# Patient Record
Sex: Female | Born: 1970
Health system: Southern US, Community
[De-identification: ages and names within clinical notes are randomized; demographics above are authoritative.]

## PROBLEM LIST (undated history)

## (undated) DIAGNOSIS — Z9889 Other specified postprocedural states: Secondary | ICD-10-CM

## (undated) DIAGNOSIS — F329 Major depressive disorder, single episode, unspecified: Secondary | ICD-10-CM

## (undated) DIAGNOSIS — G4730 Sleep apnea, unspecified: Secondary | ICD-10-CM

## (undated) DIAGNOSIS — D3613 Benign neoplasm of peripheral nerves and autonomic nervous system of lower limb, including hip: Secondary | ICD-10-CM

## (undated) DIAGNOSIS — T8859XA Other complications of anesthesia, initial encounter: Secondary | ICD-10-CM

## (undated) DIAGNOSIS — J40 Bronchitis, not specified as acute or chronic: Secondary | ICD-10-CM

## (undated) DIAGNOSIS — R112 Nausea with vomiting, unspecified: Secondary | ICD-10-CM

## (undated) DIAGNOSIS — N200 Calculus of kidney: Secondary | ICD-10-CM

## (undated) DIAGNOSIS — Z85828 Personal history of other malignant neoplasm of skin: Secondary | ICD-10-CM

## (undated) DIAGNOSIS — I10 Essential (primary) hypertension: Secondary | ICD-10-CM

## (undated) DIAGNOSIS — F32A Depression, unspecified: Secondary | ICD-10-CM

## (undated) DIAGNOSIS — T7840XA Allergy, unspecified, initial encounter: Secondary | ICD-10-CM

## (undated) DIAGNOSIS — T4145XA Adverse effect of unspecified anesthetic, initial encounter: Secondary | ICD-10-CM

## (undated) DIAGNOSIS — T753XXA Motion sickness, initial encounter: Secondary | ICD-10-CM

## (undated) DIAGNOSIS — L409 Psoriasis, unspecified: Secondary | ICD-10-CM

## (undated) DIAGNOSIS — F419 Anxiety disorder, unspecified: Secondary | ICD-10-CM

## (undated) DIAGNOSIS — N261 Atrophy of kidney (terminal): Secondary | ICD-10-CM

## (undated) DIAGNOSIS — F909 Attention-deficit hyperactivity disorder, unspecified type: Secondary | ICD-10-CM

## (undated) DIAGNOSIS — K8020 Calculus of gallbladder without cholecystitis without obstruction: Secondary | ICD-10-CM

## (undated) HISTORY — PX: FOOT SURGERY: SHX648

## (undated) HISTORY — PX: OTHER SURGICAL HISTORY: SHX169

## (undated) HISTORY — PX: TUBAL LIGATION: SHX77

## (undated) HISTORY — DX: Benign neoplasm of peripheral nerves and autonomic nervous system of lower limb, including hip: D36.13

## (undated) HISTORY — PX: EXCISION MORTON'S NEUROMA: SHX5013

## (undated) HISTORY — PX: CHOLECYSTECTOMY: SHX55

## (undated) HISTORY — PX: COLONOSCOPY: SHX174

## (undated) HISTORY — PX: HERNIA REPAIR: SHX51

## (undated) HISTORY — DX: Allergy, unspecified, initial encounter: T78.40XA

## (undated) HISTORY — PX: SMALL INTESTINE SURGERY: SHX150

## (undated) HISTORY — DX: Essential (primary) hypertension: I10

## (undated) HISTORY — PX: ABDOMINAL HYSTERECTOMY: SHX81

## (undated) HISTORY — DX: Personal history of other malignant neoplasm of skin: Z85.828

---

## 2004-03-14 ENCOUNTER — Ambulatory Visit: Payer: Self-pay | Admitting: Internal Medicine

## 2004-03-29 ENCOUNTER — Emergency Department: Payer: Self-pay | Admitting: Emergency Medicine

## 2004-05-28 HISTORY — PX: TOTAL ABDOMINAL HYSTERECTOMY: SHX209

## 2004-06-22 ENCOUNTER — Inpatient Hospital Stay: Payer: Self-pay | Admitting: Obstetrics & Gynecology

## 2006-12-23 ENCOUNTER — Ambulatory Visit: Payer: Self-pay | Admitting: Obstetrics & Gynecology

## 2007-01-01 ENCOUNTER — Ambulatory Visit: Payer: Self-pay | Admitting: Unknown Physician Specialty

## 2007-01-16 ENCOUNTER — Ambulatory Visit: Payer: Self-pay | Admitting: Obstetrics & Gynecology

## 2007-01-23 ENCOUNTER — Ambulatory Visit: Payer: Self-pay | Admitting: Obstetrics & Gynecology

## 2007-02-21 ENCOUNTER — Ambulatory Visit: Payer: Self-pay | Admitting: Unknown Physician Specialty

## 2008-10-04 ENCOUNTER — Ambulatory Visit: Payer: Self-pay | Admitting: Unknown Physician Specialty

## 2008-11-11 ENCOUNTER — Ambulatory Visit: Payer: Self-pay | Admitting: Internal Medicine

## 2008-11-24 ENCOUNTER — Ambulatory Visit: Payer: Self-pay | Admitting: Family Medicine

## 2010-03-30 ENCOUNTER — Ambulatory Visit: Payer: Self-pay | Admitting: Family Medicine

## 2010-08-15 ENCOUNTER — Ambulatory Visit: Payer: Self-pay | Admitting: Unknown Physician Specialty

## 2010-11-08 ENCOUNTER — Ambulatory Visit: Payer: Self-pay | Admitting: Unknown Physician Specialty

## 2011-01-22 ENCOUNTER — Ambulatory Visit: Payer: Self-pay | Admitting: Bariatrics

## 2011-05-29 HISTORY — PX: OTHER SURGICAL HISTORY: SHX169

## 2011-06-25 DIAGNOSIS — N816 Rectocele: Secondary | ICD-10-CM | POA: Insufficient documentation

## 2011-07-27 DIAGNOSIS — R198 Other specified symptoms and signs involving the digestive system and abdomen: Secondary | ICD-10-CM | POA: Insufficient documentation

## 2011-08-14 ENCOUNTER — Ambulatory Visit: Payer: Self-pay | Admitting: Bariatrics

## 2011-08-20 ENCOUNTER — Ambulatory Visit: Payer: Self-pay | Admitting: Urology

## 2011-08-21 ENCOUNTER — Inpatient Hospital Stay: Payer: Self-pay | Admitting: Bariatrics

## 2011-08-21 LAB — CREATININE, SERUM
Creatinine: 0.94 mg/dL (ref 0.60–1.30)
EGFR (African American): >60
EGFR (Non-African Amer.): >60

## 2011-08-21 LAB — PLATELET COUNT: Platelet: 250 10*3/uL (ref 150–440)

## 2011-08-22 LAB — CBC WITH DIFFERENTIAL/PLATELET
Basophil #: 0 10*3/uL (ref 0.0–0.1)
Basophil %: 0.1 %
Eosinophil #: 0 10*3/uL (ref 0.0–0.7)
HGB: 12.2 g/dL (ref 12.0–16.0)
Lymphocyte #: 1.7 10*3/uL (ref 1.0–3.6)
Lymphocyte %: 19.9 %
MCH: 31 pg (ref 26.0–34.0)
MCHC: 33.7 g/dL (ref 32.0–36.0)
MCV: 92 fL (ref 80–100)
Monocyte #: 0.6 10*3/uL (ref 0.0–0.7)
Neutrophil #: 6.1 10*3/uL (ref 1.4–6.5)
RBC: 3.95 10*6/uL (ref 3.80–5.20)
RDW: 12.8 % (ref 11.5–14.5)

## 2011-08-22 LAB — BASIC METABOLIC PANEL
Anion Gap: 9 (ref 7–16)
Calcium, Total: 8.3 mg/dL — ABNORMAL LOW (ref 8.5–10.1)
Chloride: 105 mmol/L (ref 98–107)
Glucose: 92 mg/dL (ref 65–99)
Osmolality: 280 (ref 275–301)
Potassium: 4.2 mmol/L (ref 3.5–5.1)
Sodium: 141 mmol/L (ref 136–145)

## 2011-09-13 ENCOUNTER — Ambulatory Visit: Payer: Self-pay | Admitting: Bariatrics

## 2011-09-26 ENCOUNTER — Ambulatory Visit: Payer: Self-pay | Admitting: Bariatrics

## 2011-11-08 ENCOUNTER — Ambulatory Visit: Payer: Self-pay | Admitting: Bariatrics

## 2012-08-25 ENCOUNTER — Ambulatory Visit: Payer: Self-pay | Admitting: Emergency Medicine

## 2013-07-03 ENCOUNTER — Ambulatory Visit: Payer: Self-pay | Admitting: Bariatrics

## 2013-07-30 ENCOUNTER — Ambulatory Visit: Payer: Self-pay | Admitting: Bariatrics

## 2013-08-26 ENCOUNTER — Ambulatory Visit: Payer: Self-pay | Admitting: Physician Assistant

## 2014-07-05 DIAGNOSIS — Z85828 Personal history of other malignant neoplasm of skin: Secondary | ICD-10-CM

## 2014-09-19 NOTE — Op Note (Signed)
PATIENT NAME:  Meghan Weeks, Meghan Weeks MR#:  [id number] DATE OF BIRTH:  12/26/1970  DATE OF PROCEDURE:  08/21/2011  PREOPERATIVE DIAGNOSIS: Morbid obesity with a BMI of 40 associated with obstructive sleep apnea.   POSTOPERATIVE DIAGNOSIS: Morbid obesity with a BMI of 40 associated with obstructive sleep apnea with approximately 1 cm stomach volume within the lower mediastinum.   PROCEDURE PERFORMED: Laparoscopic sleeve gastrectomy with repair of hiatal hernia followed by intraoperative endoscopy.   SURGEON: Venia Carbon. Duke Salvia, MD  ASSISTANT: Darrin Luis, PA  PREOPERATIVE FINDINGS: Presence of a small hiatal hernia with some gastroesophageal reflux, as noted on contrast study.   DESCRIPTION OF PROCEDURE: The patient was brought to the operating room and placed in the supine position. General anesthesia was obtained with orotracheal intubation. The patient had a Foley catheter inserted sterilely with TED hose and Thromboguards applied. The patient had a foot board placed at the end of the operative bed. The lower chest and abdomen were then sterilely prepped and draped. A small incision was made in the left upper quadrant of the abdomen through which a 5 mm Optiview trocar was introduced under direct visualization. Pneumoperitoneum was obtained with carbon dioxide. Additional trocars were then introduced across the upper abdomen under direct visualization. A Nathanson liver retractor was introduced through a subxiphoid wound with elevation of the left lobe of the liver revealing a small hiatal hernia noted. The prior position of the nasogastric tube was withdrawn at this point. The patient had division of multiple gastric pedicles along the greater curvature of the stomach beginning at a point approximately 4 cm proximal to the pylorus. This was extended to a point in which the entire short gastric vascular pedicle was divided and the ligamentous attachments to the fundus were removed from the undersurface  of the left hemidiaphragm. Peritoneal attachments to the pancreas were also divided at this point. The patient then had division of the peritoneum across the region of the anterior hiatus. The patient again was noted to have a small hiatal hernia. The patient had division of the gastrohepatic ligament and then division of the peritoneum across the anterior hiatus, as described above. Blunt dissection was then used to mobilize the esophagus and associated anterior vagus nerve away from the overlying pericardium and from the upper lateral margins of the pleural surfaces. The patient then had division of the peritoneum along the right crus and the phrenoesophageal ligaments along the left crus. Circumferential mobilization of the distal esophagus was then performed freeing it from the underlying aorta and the residual aspects of the pleura. This ultimately resulted in delivery of approximately 1.5 cm of the esophagus lying comfortably in the abdominal cavity. A single 0 Ethibond suture was used to approximate the crural musculature posteriorly. At this point, a 8 French bougie was passed transorally and directed in the level of the antrum. The patient then had a series of GI staplers placed across the lateral aspect of the stomach creating a lateralized sleeve gastrectomy. The first two firings were green load staples placed relatively parallel to the transverse dimension of the lower stomach. Next, a vertical line of staples were placed parallel to the lesser curvature with the staple line being brought out just lateral to the angle of His creating a tubular affect of the stomach. There was one area approximately 5 cm inferior to the GE junction where there was slight deviation of the staple line of approximately 2 mm off the otherwise extremely straight vertical line. There was no overt  stenosis associated with this. The patient had excellent hemostasis with this. The stomach was occluded distally and an  intraoperative endoscopy performed. There was no evidence of an air leak appreciated. The patient again was noted to have a very consistent tubular affect with avoidance of any stenosis in the region of the incisura. The stomach was decompressed and the scope withdrawn at this point. The patient had the prior divided margins of the omentum reapproximated along the margins of the staple line with interrupted 0 Ethibond sutures. This was done to prevent torsion of the gastric sleeve and potentially sealing any leak that might occur, although none would be anticipated at this point. The lateral gastric segment was then removed via the right upper quadrant 15 mm trocar site. Following removal of the gastric specimen, the fascia and peritoneum of this wound was closed with 0 Vicryl suture as passed by a needle suture passing system. After confirming hemostasis,  the pneumoperitoneum was relieved and the trocars were removed. The wounds were injected with 0.25% Marcaine followed by 4-0 Monocryl to the dermis followed by Dermabond. The patient was allowed to recover at this point having tolerated the procedure well.  ____________________________ Venia Carbon. Duke Salvia, MD mat:slb D: 08/21/2011 11:54:24 ET    T: 08/21/2011 12:59:46 ET       JOB#: [id number] cc: Legrand Como A. Duke Salvia, MD, <Dictator> Ladora Daniel MD ELECTRONICALLY SIGNED 09/05/2011 9:32

## 2014-12-16 ENCOUNTER — Other Ambulatory Visit: Payer: Self-pay | Admitting: Podiatry

## 2014-12-16 DIAGNOSIS — M25579 Pain in unspecified ankle and joints of unspecified foot: Secondary | ICD-10-CM

## 2014-12-16 DIAGNOSIS — M25473 Effusion, unspecified ankle: Secondary | ICD-10-CM

## 2014-12-22 ENCOUNTER — Ambulatory Visit
Admission: RE | Admit: 2014-12-22 | Discharge: 2014-12-22 | Disposition: A | Discharge status: Home / Self Care | Payer: BLUE CROSS/BLUE SHIELD | Source: Ambulatory Visit | Attending: Podiatry | Admitting: Podiatry

## 2014-12-22 DIAGNOSIS — M25579 Pain in unspecified ankle and joints of unspecified foot: Secondary | ICD-10-CM

## 2014-12-22 DIAGNOSIS — M25571 Pain in right ankle and joints of right foot: Secondary | ICD-10-CM | POA: Diagnosis present

## 2014-12-22 DIAGNOSIS — M25471 Effusion, right ankle: Secondary | ICD-10-CM | POA: Insufficient documentation

## 2014-12-22 DIAGNOSIS — M25473 Effusion, unspecified ankle: Secondary | ICD-10-CM

## 2015-02-15 ENCOUNTER — Emergency Department
Admission: EM | Admit: 2015-02-15 | Discharge: 2015-02-15 | Disposition: A | Discharge status: Home / Self Care | Payer: BLUE CROSS/BLUE SHIELD | Attending: Emergency Medicine | Admitting: Emergency Medicine

## 2015-02-15 ENCOUNTER — Encounter: Payer: Self-pay | Admitting: *Deleted

## 2015-02-15 DIAGNOSIS — R079 Chest pain, unspecified: Secondary | ICD-10-CM | POA: Diagnosis present

## 2015-02-15 DIAGNOSIS — R0789 Other chest pain: Secondary | ICD-10-CM | POA: Insufficient documentation

## 2015-02-15 LAB — COMPREHENSIVE METABOLIC PANEL
ALBUMIN: 4.2 g/dL (ref 3.5–5.0)
ALT: 15 U/L (ref 14–54)
AST: 17 U/L (ref 15–41)
Alkaline Phosphatase: 58 U/L (ref 38–126)
Anion gap: 6 (ref 5–15)
BUN: 17 mg/dL (ref 6–20)
CHLORIDE: 103 mmol/L (ref 101–111)
CO2: 30 mmol/L (ref 22–32)
Calcium: 9.2 mg/dL (ref 8.9–10.3)
Creatinine, Ser: 0.92 mg/dL (ref 0.44–1.00)
GFR calc Af Amer: >60 mL/min (ref >60)
GFR calc non Af Amer: >60 mL/min (ref >60)
GLUCOSE: 95 mg/dL (ref 65–99)
POTASSIUM: 4.2 mmol/L (ref 3.5–5.1)
Sodium: 139 mmol/L (ref 135–145)
Total Bilirubin: 0.4 mg/dL (ref 0.3–1.2)
Total Protein: 7.4 g/dL (ref 6.5–8.1)

## 2015-02-15 LAB — TROPONIN I

## 2015-02-15 LAB — CBC
HCT: 39.7 % (ref 35.0–47.0)
Hemoglobin: 13.5 g/dL (ref 12.0–16.0)
MCH: 30.6 pg (ref 26.0–34.0)
MCHC: 33.9 g/dL (ref 32.0–36.0)
MCV: 90.1 fL (ref 80.0–100.0)
PLATELETS: 261 10*3/uL (ref 150–440)
RBC: 4.41 MIL/uL (ref 3.80–5.20)
RDW: 12.5 % (ref 11.5–14.5)
WBC: 6.4 10*3/uL (ref 3.6–11.0)

## 2015-02-15 MED ORDER — DIAZEPAM 5 MG PO TABS: 5.0000 mg | ORAL_TABLET | Freq: Three times a day (TID) | ORAL | Status: DC | PRN

## 2015-02-15 MED ORDER — IBUPROFEN 200 MG PO TABS: 600.0000 mg | ORAL_TABLET | Freq: Four times a day (QID) | ORAL | Status: DC | PRN

## 2015-02-15 NOTE — Discharge Instructions (Signed)
You were prescribed a medication that is potentially sedating. Do not drink alcohol, drive or participate in any other potentially dangerous activities while taking this medication as it may make you sleepy. Do not take this medication with any other sedating medications, either prescription or over-the-counter. If you were prescribed Percocet or Vicodin, do not take these with acetaminophen (Tylenol) as it is already contained within these medications.   Opioid pain medications (or "narcotics") can be habit forming.  Use it as little as possible to achieve adequate pain control.  Do not use or use it with extreme caution if you have a history of opiate abuse or dependence.  If you are on a pain contract with your primary care doctor or a pain specialist, be sure to let them know you were prescribed this medication today from the [facility] Emergency Department.  This medication is intended for your use only - do not give any to anyone else and keep it in a secure place where nobody else, especially children and pets, have access to it.  It will also cause or worsen constipation, so you may want to consider taking an over-the-counter stool softener while you are taking this medication.   Chest Wall Pain Chest wall pain is pain in or around the bones and muscles of your chest. It may take up to 6 weeks to get better. It may take longer if you must stay physically active in your work and activities.  CAUSES  Chest wall pain may happen on its own. However, it may be caused by:  A viral illness like the flu.  Injury.  Coughing.  Exercise.  Arthritis.  Fibromyalgia.  Shingles. HOME CARE INSTRUCTIONS   Avoid overtiring physical activity. Try not to strain or perform activities that cause pain. This includes any activities using your chest or your abdominal and side muscles, especially if heavy weights are used.  Put ice on the sore area.  Put ice in a plastic bag.  Place a towel  between your skin and the bag.  Leave the ice on for 15-20 minutes per hour while awake for the first 2 days.  Only take over-the-counter or prescription medicines for pain, discomfort, or fever as directed by your caregiver. SEEK IMMEDIATE MEDICAL CARE IF:   Your pain increases, or you are very uncomfortable.  You have a fever.  Your chest pain becomes worse.  You have new, unexplained symptoms.  You have nausea or vomiting.  You feel sweaty or lightheaded.  You have a cough with phlegm (sputum), or you cough up blood. MAKE SURE YOU:   Understand these instructions.  Will watch your condition.  Will get help right away if you are not doing well or get worse. Document Released: 05/14/2005 Document Revised: 08/06/2011 Document Reviewed: 01/08/2011 [facility] Patient Information 2015 [place], [place]. This information is not intended to replace advice given to you by your health care provider. Make sure you discuss any questions you have with your health care provider.

## 2015-02-15 NOTE — ED Notes (Signed)
Pt to triage via wheelchair.  Pt has chest pain for 3 days.  No n/v/d.  No diaphoresis.  Pt denies sob.  No cough.  Nonsmoker.  Pain radiates into left arm and left side of neck.

## 2015-02-15 NOTE — ED Provider Notes (Signed)
[facility] Emergency Department Provider Note  ____________________________________________  Time seen: 7:40 PM  I have reviewed the triage vital signs and the nursing notes.   HISTORY  Chief Complaint Chest Pain    HPI Meghan Weeks is a 44 y.o. female who complains of chest pain for the past 3 days. 3 days ago it was intermittent multiple episodes but over the last 2 days it's been constant. Worse with standing and movement. No nausea vomiting diarrhea diaphoresis or radiation. It is not exertional or pleuritic. No shortness of breath. No recent injuries or surgeries hospitalizations or serious travel.  Patient has had some episodes of lightheadedness recently relation when she stands up and walks around. She does note that she has not been drinking much fluids recently. Eating normally.   No past medical history on file. Negative  There are no active problems to display for this patient.    No past surgical history on file. Negative  Current Outpatient Rx  Name  Route  Sig  Dispense  Refill  . diazepam (VALIUM) 5 MG tablet   Oral   Take 1 tablet (5 mg total) by mouth every 8 (eight) hours as needed for muscle spasms.   6 tablet   0   . ibuprofen (MOTRIN IB) 200 MG tablet   Oral   Take 3 tablets (600 mg total) by mouth every 6 (six) hours as needed.   60 tablet   0      Allergies Review of patient's allergies indicates no known allergies.   No family history on file.  Social History Social History  Substance Use Topics  . Smoking status: Never Smoker   . Smokeless tobacco: Not on file  . Alcohol Use: No    Review of Systems  Constitutional:   No fever or chills. No weight changes Eyes:   No blurry vision or double vision.  ENT:   No sore throat. Cardiovascular:   Positive as above chest pain. Respiratory:   No dyspnea or cough. Gastrointestinal:   Negative for abdominal pain, vomiting and diarrhea.  No BRBPR or  melena. Genitourinary:   Negative for dysuria, urinary retention, bloody urine, or difficulty urinating. Musculoskeletal:   Negative for back pain. No joint swelling or pain. Skin:   Negative for rash. Neurological:   Negative for headaches, focal weakness or numbness. Psychiatric:  No anxiety or depression.   Endocrine:  No hot/cold intolerance, changes in energy, or sleep difficulty.  10-point ROS otherwise negative.  ____________________________________________   PHYSICAL EXAM:  VITAL SIGNS: ED Triage Vitals  Enc Vitals Group     BP 02/15/15 1716 122/80 mmHg     Pulse Rate 02/15/15 1716 64     Resp 02/15/15 1716 20     Temp 02/15/15 1716 98 F (36.7 C)     Temp Source 02/15/15 1716 Oral     SpO2 02/15/15 1716 99 %     Weight 02/15/15 1716 165 lb (74.844 kg)     Height 02/15/15 1716 5\' 5"  (1.651 m)     Head Cir --      Peak Flow --      Pain Score 02/15/15 1717 5     Pain Loc --      Pain Edu? --      Excl. in [place]? --      Constitutional:   Alert and oriented. Well appearing and in no distress. Eyes:   No scleral icterus. No conjunctival pallor. PERRL. [facility]  Head:   Normocephalic and atraumatic.   Nose:   No congestion/rhinnorhea. No septal hematoma   Mouth/Throat:   MMM, no pharyngeal erythema. No peritonsillar mass. No uvula shift.   Neck:   No stridor. No SubQ emphysema. No meningismus. Hematological/Lymphatic/Immunilogical:   No cervical lymphadenopathy. Cardiovascular:   RRR. Normal and symmetric distal pulses are present in all extremities. No murmurs, rubs, or gallops. Respiratory:   Normal respiratory effort without tachypnea nor retractions. Breath sounds are clear and equal bilaterally. No wheezes/rales/rhonchi. Gastrointestinal:   Soft and nontender. No distention. There is no CVA tenderness.  No rebound, rigidity, or guarding. Genitourinary:   deferred Musculoskeletal:   Left chest wall tender to palpation which is also reproduced with  rotator cuff stress. She also has tenderness in the trapezius and sternocleidomastoid on the left side which reproduces the pain. His muscles are also tense.  Extremities Nontender with normal range of motion in all extremities. No joint effusions.  No lower extremity tenderness.  No edema. Neurologic:   Normal speech and language.  CN 2-10 normal. Motor grossly intact. No pronator drift.  Normal gait. No gross focal neurologic deficits are appreciated.  Skin:    Skin is warm, dry and intact. No rash noted.  No petechiae, purpura, or bullae. Psychiatric:   Mood and affect are normal. Speech and behavior are normal. Patient exhibits appropriate insight and judgment.  ____________________________________________    LABS (pertinent positives/negatives) (all labs ordered are listed, but only abnormal results are displayed) Labs Reviewed  CBC  COMPREHENSIVE METABOLIC PANEL  TROPONIN I   ____________________________________________   EKG  Interpreted by me  Date: 02/15/2015  Rate: 59  Rhythm: normal sinus rhythm  QRS Axis: normal  Intervals: normal  ST/T Wave abnormalities: normal  Conduction Disutrbances: none  Narrative Interpretation: unremarkable      ____________________________________________    RADIOLOGY    ____________________________________________   PROCEDURES   ____________________________________________   INITIAL IMPRESSION / ASSESSMENT AND PLAN / ED COURSE  Pertinent labs & imaging results that were available during my care of the patient were reviewed by me and considered in my medical decision making (see chart for details).  Patient presents with atypical chest pain which actually on exam appears to be musculoskeletal in nature related to rotator cuff or the muscles of the left shoulder and neck. No history of trauma or any serious injury that might have caused this, but exam is consistent with muscle strain and spasm. Labs and EKG are  reassuring. Very low suspicion for ACS PE TAD pneumothorax carditis pneumonia or sepsis. We'll treat the patient with NSAIDs and when necessary Valium, follow up with primary care.     ____________________________________________   FINAL CLINICAL IMPRESSION(S) / ED DIAGNOSES  Final diagnoses:  Chest wall pain      Carrie Mew, MD 02/15/15 2015

## 2015-12-01 DIAGNOSIS — G4733 Obstructive sleep apnea (adult) (pediatric): Secondary | ICD-10-CM | POA: Diagnosis not present

## 2015-12-22 DIAGNOSIS — I890 Lymphedema, not elsewhere classified: Secondary | ICD-10-CM | POA: Diagnosis not present

## 2015-12-27 DIAGNOSIS — M25571 Pain in right ankle and joints of right foot: Secondary | ICD-10-CM | POA: Diagnosis not present

## 2015-12-27 DIAGNOSIS — D1723 Benign lipomatous neoplasm of skin and subcutaneous tissue of right leg: Secondary | ICD-10-CM | POA: Diagnosis not present

## 2015-12-27 DIAGNOSIS — R600 Localized edema: Secondary | ICD-10-CM | POA: Diagnosis not present

## 2016-01-16 ENCOUNTER — Ambulatory Visit: Payer: Self-pay | Admitting: Podiatry

## 2016-01-22 DIAGNOSIS — I890 Lymphedema, not elsewhere classified: Secondary | ICD-10-CM | POA: Diagnosis not present

## 2016-01-25 ENCOUNTER — Encounter: Payer: Self-pay | Admitting: Podiatry

## 2016-01-25 ENCOUNTER — Ambulatory Visit (INDEPENDENT_AMBULATORY_CARE_PROVIDER_SITE_OTHER): Payer: BLUE CROSS/BLUE SHIELD | Admitting: Podiatry

## 2016-01-25 DIAGNOSIS — I890 Lymphedema, not elsewhere classified: Secondary | ICD-10-CM

## 2016-01-25 DIAGNOSIS — M779 Enthesopathy, unspecified: Secondary | ICD-10-CM

## 2016-01-25 DIAGNOSIS — M722 Plantar fascial fibromatosis: Secondary | ICD-10-CM | POA: Diagnosis not present

## 2016-01-25 MED ORDER — MELOXICAM 15 MG PO TABS: 15.0000 mg | ORAL_TABLET | Freq: Every day | ORAL | 3 refills | Status: DC

## 2016-01-25 MED ORDER — METHYLPREDNISOLONE 4 MG PO TBPK: ORAL_TABLET | ORAL | 0 refills | Status: DC

## 2016-01-25 NOTE — Patient Instructions (Signed)
Plantar Fasciitis Plantar fasciitis is a painful foot condition that affects the heel. It occurs when the band of tissue that connects the toes to the heel bone (plantar fascia) becomes irritated. This can happen after exercising too much or doing other repetitive activities (overuse injury). The pain from plantar fasciitis can range from mild irritation to severe pain that makes it difficult for you to walk or move. The pain is usually worse in the morning or after you have been sitting or lying down for a while. CAUSES This condition may be caused by:  Standing for long periods of time.  Wearing shoes that do not fit.  Doing high-impact activities, including running, aerobics, and ballet.  Being overweight.  Having an abnormal way of walking (gait).  Having tight calf muscles.  Having high arches in your feet.  Starting a new athletic activity. SYMPTOMS The main symptom of this condition is heel pain. Other symptoms include:  Pain that gets worse after activity or exercise.  Pain that is worse in the morning or after resting.  Pain that goes away after you walk for a few minutes. DIAGNOSIS This condition may be diagnosed based on your signs and symptoms. Your health care provider will also do a physical exam to check for:  A tender area on the bottom of your foot.  A high arch in your foot.  Pain when you move your foot.  Difficulty moving your foot. You may also need to have imaging studies to confirm the diagnosis. These can include:  X-rays.  Ultrasound.  MRI. TREATMENT  Treatment for plantar fasciitis depends on the severity of the condition. Your treatment may include:  Rest, ice, and over-the-counter pain medicines to manage your pain.  Exercises to stretch your calves and your plantar fascia.  A splint that holds your foot in a stretched, upward position while you sleep (night splint).  Physical therapy to relieve symptoms and prevent problems in the  future.  Cortisone injections to relieve severe pain.  Extracorporeal shock wave therapy (ESWT) to stimulate damaged plantar fascia with electrical impulses. It is often used as a last resort before surgery.  Surgery, if other treatments have not worked after 12 months. HOME CARE INSTRUCTIONS  Take medicines only as directed by your health care provider.  Avoid activities that cause pain.  Roll the bottom of your foot over a bag of ice or a bottle of cold water. Do this for 20 minutes, 3-4 times a day.  Perform simple stretches as directed by your health care provider.  Try wearing athletic shoes with air-sole or gel-sole cushions or soft shoe inserts.  Wear a night splint while sleeping, if directed by your health care provider.  Keep all follow-up appointments with your health care provider. PREVENTION   Do not perform exercises or activities that cause heel pain.  Consider finding low-impact activities if you continue to have problems.  Lose weight if you need to. The best way to prevent plantar fasciitis is to avoid the activities that aggravate your plantar fascia. SEEK MEDICAL CARE IF:  Your symptoms do not go away after treatment with home care measures.  Your pain gets worse.  Your pain affects your ability to move or do your daily activities.   This information is not intended to replace advice given to you by your health care provider. Make sure you discuss any questions you have with your health care provider.   Document Released: 02/06/2001 Document Revised: 02/02/2015 Document Reviewed: 03/24/2014 Elsevier   Interactive Patient Education 2016 [employer]  

## 2016-01-25 NOTE — Progress Notes (Signed)
   Subjective:    Patient ID: Meghan Weeks, female    DOB: [birth year], 45 y.o.   MRN: [id number]  HPI: She presents today as a 45 year old white female with a chief complaint of pain to the lateral aspect of her foot and ankle. She states it is been aching and swelling for over a year. She's been to see Dr. Elvina Mattes in the past who x-rayed it had an MRI performed who gave her multiple injections to the right subtalar joint and put her in not only a boot but also a cast. She states that she has also been seen by vascular states that she has lymphedema bilaterally right greater than left. She states that therapies failed. She denies trauma. She goes on to say that her heel has been hurting the entire time.    Review of Systems  Constitutional: Positive for unexpected weight change.  Cardiovascular: Positive for leg swelling.  Endocrine: Positive for cold intolerance.  All other systems reviewed and are negative.      Objective:   Physical Exam: Vital signs are stable she is alert and oriented 3. Pulses are strongly palpable. Neurologic sensorium is intact. Deep tendon reflexes are intact. Muscle strength +5/5 dorsiflexion plantar flexors and inverters everters on his musculature is intact. She does have some mild lymphedema bilaterally. She has tenderness on palpation of the sinus tarsi of the right foot. Minimal pain on end range of motion of the subtalar joint. She has pain on palpation of the fourth and fifth metatarsal cuboid articulation site right. He also has pain on palpation of the medial calcaneal tubercle of the plantar fascia calcaneal insertion site. Radiographs taken in the office today demonstrate 3 views no major osseous abnormalities though soft tissue increase in density is visible at the plantar fascia calcaneal insertion site. I see no signs of osteoarthritic changes of the subtalar joint or of the ankle joint. I reviewed the old MRI taken last year that does not demonstrate any  type of soft tissue or osseous abnormality.        Assessment & Plan:  Assessment: Plantar fasciitis right foot. Lateral compensatory syndrome.  Plan: Discussed etiology pathology conservative versus surgical therapies. Injected the right heel today with Kenalog and local anesthetic placed her in a plantar fascia brace to be followed by a night splint. Recommended that she continue to use her compression devices for her lymphedema at all times. She is to keep her legs elevated as often as possible. Also placed her on a Medrol Dosepak to be followed by meloxicam. We discussed appropriate shoe gear stretching exercises and ice therapy. I will follow-up with her in 1 month.

## 2016-02-22 DIAGNOSIS — I890 Lymphedema, not elsewhere classified: Secondary | ICD-10-CM | POA: Diagnosis not present

## 2016-02-27 DIAGNOSIS — R05 Cough: Secondary | ICD-10-CM | POA: Diagnosis not present

## 2016-02-27 DIAGNOSIS — B9689 Other specified bacterial agents as the cause of diseases classified elsewhere: Secondary | ICD-10-CM | POA: Diagnosis not present

## 2016-02-27 DIAGNOSIS — J019 Acute sinusitis, unspecified: Secondary | ICD-10-CM | POA: Diagnosis not present

## 2016-02-29 ENCOUNTER — Ambulatory Visit (INDEPENDENT_AMBULATORY_CARE_PROVIDER_SITE_OTHER): Payer: BLUE CROSS/BLUE SHIELD | Admitting: Podiatry

## 2016-02-29 DIAGNOSIS — M779 Enthesopathy, unspecified: Secondary | ICD-10-CM | POA: Diagnosis not present

## 2016-02-29 DIAGNOSIS — M722 Plantar fascial fibromatosis: Secondary | ICD-10-CM | POA: Diagnosis not present

## 2016-02-29 NOTE — Progress Notes (Signed)
She presents today for follow-up of her plantar fasciitis and right lateral ankle pain. She states that she really never saw much improvement with the oral medication and the braces and strapping. She states that the injection did not seem to make a difference either. She still has pain to the anterolateral aspect of the foot as well as plantar medial aspect of the right heel.  Objective: Vital signs are stable she is alert and oriented 3 pulses are palpable. Neurologic sensorium is intact. Deep tendon reflexes are intact. She still has pain and tenderness on palpation of the medial continue tubercle right heel. She also has some tenderness on palpation to the anterolateral aspect of the right ankle. 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 An MRI had been performed July of last year without significant findings. I reviewed the MRI and the MRI report today. I see no significant findings other than inflammation of the plantar fascia at its insertion site. The ankle itself looks good.  Assessment: Chronic pain right ankle and foot history of trauma to the right ankle and foot. All conservative treatments have failed.  Plan: I'm requesting a follow-up MRI of the right foot if this is negative. I will consider rheumatology for enthesopathies.

## 2016-03-01 ENCOUNTER — Telehealth: Payer: Self-pay | Admitting: *Deleted

## 2016-03-01 NOTE — Addendum Note (Signed)
Addended by: Clovis Riley E on: 03/01/2016 09:17 AM   Modules accepted: Orders

## 2016-03-01 NOTE — Telephone Encounter (Signed)
MRI orders for right ankle and foot faxed to [facility].

## 2016-03-14 ENCOUNTER — Ambulatory Visit: Payer: BLUE CROSS/BLUE SHIELD

## 2016-03-23 DIAGNOSIS — G4733 Obstructive sleep apnea (adult) (pediatric): Secondary | ICD-10-CM | POA: Diagnosis not present

## 2016-03-26 ENCOUNTER — Encounter: Payer: Self-pay | Admitting: Podiatry

## 2016-03-26 ENCOUNTER — Ambulatory Visit (INDEPENDENT_AMBULATORY_CARE_PROVIDER_SITE_OTHER): Payer: BLUE CROSS/BLUE SHIELD | Admitting: Podiatry

## 2016-03-26 DIAGNOSIS — L409 Psoriasis, unspecified: Secondary | ICD-10-CM

## 2016-03-26 DIAGNOSIS — M722 Plantar fascial fibromatosis: Secondary | ICD-10-CM

## 2016-03-27 NOTE — Progress Notes (Signed)
She presents today for follow-up of her right ankle pain. She states it is really just sore as she refers to the anterolateral ankle and the plantar fascia of the right foot. We have requested an MRI the last time she was in that she failed to have this done because of its expense. She will like another option.  Objective: Pulses are the same strongly palpable pain overlying the sinus tarsi area as well as the plantar fascia at its insertion site.  Assessment lateral ankle pain chronic secondary to plantar fasciitis right.  Plan: I explained to her that her only obvious alternative other than opening the foot in looking at it would be to consider orthotics. I explained her that if orthotics failed to alleviate her symptoms then we would have to do an MRI or center to physical therapy. She understands this is amendable to it and will follow up with me once her orthotics come in.

## 2016-03-28 ENCOUNTER — Encounter: Payer: Self-pay | Admitting: Podiatry

## 2016-04-23 ENCOUNTER — Encounter: Payer: Self-pay | Admitting: Podiatry

## 2016-04-23 ENCOUNTER — Ambulatory Visit (INDEPENDENT_AMBULATORY_CARE_PROVIDER_SITE_OTHER): Payer: BLUE CROSS/BLUE SHIELD | Admitting: Podiatry

## 2016-04-23 DIAGNOSIS — M722 Plantar fascial fibromatosis: Secondary | ICD-10-CM

## 2016-04-23 NOTE — Patient Instructions (Signed)

## 2016-04-24 NOTE — Progress Notes (Signed)
She presents today to pick up her orthotics for her shoes. She states that her right heel is still painful but somewhat better than it was before.  Objective: Pulses are palpable no calf pain. She has pain on palpation medial calcaneal tubercle of the right heel.  Assessment: Intractable plantar fasciitis right.  Plan: Dispensed orthotics provided with both oral and written home going instructions for care and use of the orthotics. Reinjected the right heel today with Kenalog and local anesthetic will follow up with her in 1 month to 6 weeks.

## 2016-04-25 DIAGNOSIS — Z85828 Personal history of other malignant neoplasm of skin: Secondary | ICD-10-CM | POA: Diagnosis not present

## 2016-04-25 DIAGNOSIS — L409 Psoriasis, unspecified: Secondary | ICD-10-CM | POA: Diagnosis not present

## 2016-04-25 DIAGNOSIS — L709 Acne, unspecified: Secondary | ICD-10-CM | POA: Diagnosis not present

## 2016-04-25 DIAGNOSIS — L719 Rosacea, unspecified: Secondary | ICD-10-CM | POA: Diagnosis not present

## 2016-05-03 DIAGNOSIS — J208 Acute bronchitis due to other specified organisms: Secondary | ICD-10-CM | POA: Diagnosis not present

## 2016-05-06 ENCOUNTER — Ambulatory Visit (HOSPITAL_COMMUNITY)
Admission: RE | Admit: 2016-05-06 | Discharge: 2016-05-06 | Disposition: A | Discharge status: Home / Self Care | Payer: BLUE CROSS/BLUE SHIELD | Source: Ambulatory Visit | Attending: Podiatry | Admitting: Podiatry

## 2016-05-06 ENCOUNTER — Ambulatory Visit (HOSPITAL_COMMUNITY): Payer: BLUE CROSS/BLUE SHIELD

## 2016-05-06 DIAGNOSIS — M722 Plantar fascial fibromatosis: Secondary | ICD-10-CM | POA: Diagnosis not present

## 2016-05-06 DIAGNOSIS — M7989 Other specified soft tissue disorders: Secondary | ICD-10-CM | POA: Diagnosis not present

## 2016-05-06 DIAGNOSIS — M25571 Pain in right ankle and joints of right foot: Secondary | ICD-10-CM | POA: Diagnosis not present

## 2016-05-06 DIAGNOSIS — M779 Enthesopathy, unspecified: Secondary | ICD-10-CM | POA: Insufficient documentation

## 2016-05-06 DIAGNOSIS — M79671 Pain in right foot: Secondary | ICD-10-CM | POA: Diagnosis not present

## 2016-05-09 ENCOUNTER — Ambulatory Visit (INDEPENDENT_AMBULATORY_CARE_PROVIDER_SITE_OTHER): Payer: BLUE CROSS/BLUE SHIELD | Admitting: Podiatry

## 2016-05-09 DIAGNOSIS — M66871 Spontaneous rupture of other tendons, right ankle and foot: Secondary | ICD-10-CM | POA: Diagnosis not present

## 2016-05-09 DIAGNOSIS — M722 Plantar fascial fibromatosis: Secondary | ICD-10-CM | POA: Diagnosis not present

## 2016-05-09 NOTE — Patient Instructions (Signed)

## 2016-05-09 NOTE — Progress Notes (Signed)
She presents today for follow-up of her MRI and her ankle pain.  Objective: Vital signs are stable she is alert and oriented 3. She still has pain on palpation medial calcaneal tubercle of the right heel and some tenderness on palpation posterior tibial tendon right. MRI result does demonstrate a partial tear of the posterior tibial tendon and a chronic proximal plantar fasciitis. Also demonstrates a soft tissue increase in density overlying the sinus tarsi of the right foot.  Assessment: Posterior tibial tendon tear but her fasciitis right. Soft tissue mass.  Plan: We discussed surgical intervention consisting of endoscopic plantar fasciotomy right foot. Primary repair of posterior tibial tendon and excision of soft tissue mass anterolateral ankle right. We discussed surgical intervention in great detail today consisting of present on surgery possible postop complications which may include but are not limited to postop pain bleeding swelling infection recurrence need for further surgery overcorrection and under correction. She signed all 3 pages of the  patient's consent form and I will follow-up with her near future.

## 2016-05-11 ENCOUNTER — Telehealth: Payer: Self-pay | Admitting: *Deleted

## 2016-05-11 NOTE — Telephone Encounter (Signed)
"  I want to schedule my surgery.  I need to do it by the end of the year."  He can do it on December 28.  "Okay, I will take it."  Someone from the surgical center will call you with the arrival time a day or two before.  You can go ahead and register with the surgical center.

## 2016-05-13 ENCOUNTER — Encounter: Payer: Self-pay | Admitting: Podiatry

## 2016-05-18 ENCOUNTER — Ambulatory Visit (INDEPENDENT_AMBULATORY_CARE_PROVIDER_SITE_OTHER): Payer: BLUE CROSS/BLUE SHIELD

## 2016-05-18 ENCOUNTER — Ambulatory Visit (INDEPENDENT_AMBULATORY_CARE_PROVIDER_SITE_OTHER): Payer: BLUE CROSS/BLUE SHIELD | Admitting: Orthopaedic Surgery

## 2016-05-18 ENCOUNTER — Encounter (INDEPENDENT_AMBULATORY_CARE_PROVIDER_SITE_OTHER): Payer: Self-pay | Admitting: Orthopaedic Surgery

## 2016-05-18 DIAGNOSIS — G8929 Other chronic pain: Secondary | ICD-10-CM | POA: Diagnosis not present

## 2016-05-18 DIAGNOSIS — M25561 Pain in right knee: Secondary | ICD-10-CM | POA: Diagnosis not present

## 2016-05-19 NOTE — Progress Notes (Signed)
   Office Visit Note   Patient: Meghan Weeks           Date of Birth: Jun 07, 1970           MRN: [id number] Visit Date: 05/18/2016              Requested by: Ronnell Freshwater, NP [place],  [place] [id number] PCP: Ronnell Freshwater, NP   Assessment & Plan: Visit Diagnoses:  1. Chronic pain of right knee     Plan: Impression is medial meniscal tear versus chronic MCL tear versus pedis tendinitis. At this point patient has failed extensive conservative treatment and continues to have pain. I do think that she an MRI is warranted to evaluate for structural abnormality. Follow-up after the MRI.  Follow-Up Instructions: Return in about 2 weeks (around 06/01/2016) for review MRI right knee.   Orders:  Orders Placed This Encounter  Procedures  . XR KNEE 3 VIEW RIGHT  . MR Knee Right w/o contrast   No orders of the defined types were placed in this encounter.     Procedures: No procedures performed   Clinical Data: No additional findings.   Subjective: Chief Complaint  Patient presents with  . Right Knee - Pain    Patient is a 45 year old female who comes in with right medial knee pain that originally started in 2012 when she fell down some stairs and sustained a MCL sprain by MRI. She states that she continues to have pain in this region with what she calls soft tissue pain but not joint or bone pain. She rates the pain as 3-4 out of 10. The pain does not radiate. She is having foot surgery in a couple of days. She denies any mechanical symptoms or swelling. She endorses throbbing of the knee. Denies any constitutional symptoms.    Review of Systems Negative except for history of present illness  Objective: Vital Signs: There were no vitals taken for this visit.  Physical Exam Well-developed well-nourished no acute distress alert and 3 nonlabored breathing abdomen soft no lymphadenopathy Ortho Exam Exam of the right knee shows no joint effusion. She has  good range of motion of the knee. She is tender over the MCL medial joint line and has tendon. Hamstring tendons are not tender posteriorly. She does have pain with McMurray testing but not a true positive McMurray sign. Collaterals and cruciates are stable. Specialty Comments:  No specialty comments available.  Imaging: No results found.   PMFS History: Patient Active Problem List   Diagnosis Date Noted  . Chronic pain of right knee 05/18/2016  . History of nonmelanoma skin cancer 07/05/2014  . Personal history of other malignant neoplasm of skin 07/05/2014  . Other specified symptoms and signs involving the digestive system and abdomen 07/27/2011  . Symptoms involving digestive system 07/27/2011  . Rectocele 06/25/2011   No past medical history on file.  No family history on file.  No past surgical history on file. Social History   Occupational History  . Not on file.   Social History Main Topics  . Smoking status: Never Smoker  . Smokeless tobacco: Never Used  . Alcohol use No  . Drug use: Unknown  . Sexual activity: Not on file

## 2016-05-23 ENCOUNTER — Ambulatory Visit: Payer: BLUE CROSS/BLUE SHIELD | Admitting: Podiatry

## 2016-05-24 ENCOUNTER — Ambulatory Visit (INDEPENDENT_AMBULATORY_CARE_PROVIDER_SITE_OTHER): Payer: BLUE CROSS/BLUE SHIELD | Admitting: Vascular Surgery

## 2016-05-24 ENCOUNTER — Encounter: Payer: Self-pay | Admitting: Podiatry

## 2016-05-24 ENCOUNTER — Other Ambulatory Visit: Payer: Self-pay | Admitting: Podiatry

## 2016-05-24 DIAGNOSIS — M25571 Pain in right ankle and joints of right foot: Secondary | ICD-10-CM | POA: Diagnosis not present

## 2016-05-24 DIAGNOSIS — M6740 Ganglion, unspecified site: Secondary | ICD-10-CM

## 2016-05-24 DIAGNOSIS — M722 Plantar fascial fibromatosis: Secondary | ICD-10-CM

## 2016-05-24 DIAGNOSIS — S86811A Strain of other muscle(s) and tendon(s) at lower leg level, right leg, initial encounter: Secondary | ICD-10-CM | POA: Diagnosis not present

## 2016-05-24 DIAGNOSIS — G4733 Obstructive sleep apnea (adult) (pediatric): Secondary | ICD-10-CM | POA: Diagnosis not present

## 2016-05-24 DIAGNOSIS — M65871 Other synovitis and tenosynovitis, right ankle and foot: Secondary | ICD-10-CM

## 2016-05-24 DIAGNOSIS — M720 Palmar fascial fibromatosis [Dupuytren]: Secondary | ICD-10-CM | POA: Diagnosis not present

## 2016-05-24 DIAGNOSIS — D1723 Benign lipomatous neoplasm of skin and subcutaneous tissue of right leg: Secondary | ICD-10-CM | POA: Diagnosis not present

## 2016-05-24 MED ORDER — CEPHALEXIN 500 MG PO CAPS: 500.0000 mg | ORAL_CAPSULE | Freq: Three times a day (TID) | ORAL | 0 refills | Status: DC

## 2016-05-24 MED ORDER — HYDROMORPHONE HCL 4 MG PO TABS: 4.0000 mg | ORAL_TABLET | ORAL | 0 refills | Status: DC | PRN

## 2016-05-24 MED ORDER — ONDANSETRON HCL 4 MG PO TABS: 4.0000 mg | ORAL_TABLET | Freq: Three times a day (TID) | ORAL | 0 refills | Status: DC | PRN

## 2016-05-29 ENCOUNTER — Telehealth: Payer: Self-pay

## 2016-05-29 NOTE — Telephone Encounter (Signed)
Spoke with pt regarding post op status. She states that the 1st 2 days were difficult but since then she has been doing well, managing her pain with motrin. Advised her to continue with ice, elevation and rest. She denied any s/s of infection. Any acute symptom changes are to be reported

## 2016-05-30 ENCOUNTER — Ambulatory Visit (INDEPENDENT_AMBULATORY_CARE_PROVIDER_SITE_OTHER): Payer: BLUE CROSS/BLUE SHIELD

## 2016-05-30 ENCOUNTER — Ambulatory Visit (INDEPENDENT_AMBULATORY_CARE_PROVIDER_SITE_OTHER): Payer: Self-pay | Admitting: Podiatry

## 2016-05-30 VITALS — BP 119/79 | HR 61 | Temp 98.6°F | Resp 16

## 2016-05-30 DIAGNOSIS — M722 Plantar fascial fibromatosis: Secondary | ICD-10-CM

## 2016-05-30 DIAGNOSIS — Z9889 Other specified postprocedural states: Secondary | ICD-10-CM

## 2016-05-30 DIAGNOSIS — M66871 Spontaneous rupture of other tendons, right ankle and foot: Secondary | ICD-10-CM

## 2016-05-30 DIAGNOSIS — M6740 Ganglion, unspecified site: Secondary | ICD-10-CM

## 2016-05-30 NOTE — Progress Notes (Signed)
She presents today 1 week status post endoscopic plantar fasciotomy excision of a soft tissue tumor right ankle and repair of the posterior tibial tendon with a Kidner procedure right foot. She presents in a cast today utilizing a knee scooter. States that she's doing well denies chest sprain shortness of breath or calf pain.  Objective: Cast appears to be intact she has good sensation of the toes with good motion of the toes the cast is not tight around the calf radiographs demonstrate a anchor to the navicular bone and staples. No signs of gas or destruction of the soft tissue.  Assessment: Well-healing surgical foot status post one week.  Plan: Follow up with [place] in one week for cast change and staple removal.

## 2016-06-06 ENCOUNTER — Ambulatory Visit (INDEPENDENT_AMBULATORY_CARE_PROVIDER_SITE_OTHER): Payer: BLUE CROSS/BLUE SHIELD | Admitting: Podiatry

## 2016-06-06 DIAGNOSIS — Z9889 Other specified postprocedural states: Secondary | ICD-10-CM

## 2016-06-06 DIAGNOSIS — M66871 Spontaneous rupture of other tendons, right ankle and foot: Secondary | ICD-10-CM

## 2016-06-06 DIAGNOSIS — M722 Plantar fascial fibromatosis: Secondary | ICD-10-CM | POA: Diagnosis not present

## 2016-06-06 DIAGNOSIS — M6740 Ganglion, unspecified site: Secondary | ICD-10-CM

## 2016-06-06 NOTE — Progress Notes (Signed)
She presents today date of surgery 05/24/2016 status post EPF right foot excision of a lipoma anterolateral ankle right foot repair of the posterior tibial tendon right foot with cast application. She denies chest pain shortness of breath fever and chills. She does relate falling from her knee scooter catching herself with her surgical foot and landing on the medial aspect of the foot. States that she call the doctor on call as needed that she fell on this and he told her to follow up with me provided there was not a lot of pain.  Objective: Vital signs are stable to alert and oriented 3 cast removed today demonstrates moderate edema to the distal aspect of the forefoot no erythema cellulitis drainage or odor pain warmness in the calf. She has tenderness overlying the staple site and the medial incision EPF and anterolateral ankle incision sutures were removed portion of the staples were removed today.  Assessment: Well healing surgical foot.  Plan: Redressed today aggressive compressive dressing replace her cast will remove it in 2 weeks and remove the remainder of the staples.

## 2016-06-12 ENCOUNTER — Telehealth: Payer: Self-pay | Admitting: *Deleted

## 2016-06-12 NOTE — Telephone Encounter (Signed)
Pt states she was given the [facility] office phone to call about hitting her surgery foot on the floor. Left message informing pt to stay in the surgical shoe, rest, ice and elevate and make an appt to see Dr.Hyatt.

## 2016-06-20 ENCOUNTER — Ambulatory Visit (INDEPENDENT_AMBULATORY_CARE_PROVIDER_SITE_OTHER): Payer: BLUE CROSS/BLUE SHIELD | Admitting: Podiatry

## 2016-06-20 ENCOUNTER — Ambulatory Visit (INDEPENDENT_AMBULATORY_CARE_PROVIDER_SITE_OTHER): Payer: BLUE CROSS/BLUE SHIELD

## 2016-06-20 DIAGNOSIS — M6740 Ganglion, unspecified site: Secondary | ICD-10-CM | POA: Diagnosis not present

## 2016-06-20 DIAGNOSIS — Z9889 Other specified postprocedural states: Secondary | ICD-10-CM | POA: Diagnosis not present

## 2016-06-20 DIAGNOSIS — M66871 Spontaneous rupture of other tendons, right ankle and foot: Secondary | ICD-10-CM

## 2016-06-20 NOTE — Progress Notes (Signed)
She presents today for follow-up of an endoscopic plantar fasciotomy excision ganglion cyst from the ankle and a posterior tibial tendon repair right. She states that she fell over on her scooter hitting her foot. This makes twice that she's hit the foot from accidents with her scooter. She states this occurred about a week ago.  Objective: Vital signs are stable alert and oriented 3. Pulses are palpable. Neurologic sensorium is intact. Deep tendon reflexes are intact. Cast was removed demonstrates minimal edema no erythema cellulitis and throat are all sutures were removed staples were removed margins remain well coapted great range of motion.  Assessment: Healing surgical foot right.  Plan: Place her Cam Walker nonweightbearing status and will allow her to start washing this foot but she is not to stand on it at all. I did encourage some slight range of motion exercises.

## 2016-06-29 ENCOUNTER — Encounter (INDEPENDENT_AMBULATORY_CARE_PROVIDER_SITE_OTHER): Payer: Self-pay | Admitting: *Deleted

## 2016-07-02 ENCOUNTER — Ambulatory Visit (INDEPENDENT_AMBULATORY_CARE_PROVIDER_SITE_OTHER): Payer: Self-pay | Admitting: Podiatry

## 2016-07-02 DIAGNOSIS — M6740 Ganglion, unspecified site: Secondary | ICD-10-CM

## 2016-07-02 DIAGNOSIS — M722 Plantar fascial fibromatosis: Secondary | ICD-10-CM

## 2016-07-02 DIAGNOSIS — M66871 Spontaneous rupture of other tendons, right ankle and foot: Secondary | ICD-10-CM

## 2016-07-03 NOTE — Progress Notes (Signed)
She presents today for a postop visit regarding an endoscopic fasciotomy of the right foot as well as a posterior tibial tendon repair right foot. Date of surgery 05/24/2016. She states that she feels better but is still painful. She has been nonweightbearing with her Cam Walker and crutches.  Objective: She presents on a scooter today. Vital signs are stable she is alert and oriented 3. Pulses are palpable. Neurologic sensorium is intact. Deep tendon reflexes are intact she's a swelling to the right lower extremity pain. She has good passive range of motion to the right foot. She has tenderness on palpation of the posterior tibial tendon and inversion against resistance. Minimal tenderness on palpation of plantar aspect of the right heel.  Assessment: Well healing surgical foot status post 6 weeks EPL posterior tibial tendon repair and excision of a lipoma.  Plan: Continue to utilize compression to the right lower extremity and will allow her to start partial weightbearing this was demonstrated to her today with her crutches. We will progress her the next 2 weeks to weightbearing and which time we will try to go into a trial of brace next visit.

## 2016-07-04 ENCOUNTER — Encounter: Payer: BLUE CROSS/BLUE SHIELD | Admitting: Podiatry

## 2016-07-05 NOTE — Progress Notes (Signed)
DOS 12.28.2017 Endoscopic plantar fasciotomy right foot. Open repair post. tibial tendon tear right. Excision soft tissue mass right lateral ankle.

## 2016-07-06 ENCOUNTER — Encounter: Payer: Self-pay | Admitting: Podiatry

## 2016-07-12 ENCOUNTER — Ambulatory Visit (INDEPENDENT_AMBULATORY_CARE_PROVIDER_SITE_OTHER): Payer: BLUE CROSS/BLUE SHIELD | Admitting: Vascular Surgery

## 2016-07-18 ENCOUNTER — Ambulatory Visit (INDEPENDENT_AMBULATORY_CARE_PROVIDER_SITE_OTHER): Payer: Self-pay | Admitting: Podiatry

## 2016-07-18 ENCOUNTER — Encounter: Payer: Self-pay | Admitting: *Deleted

## 2016-07-18 ENCOUNTER — Ambulatory Visit
Admission: EM | Admit: 2016-07-18 | Discharge: 2016-07-18 | Disposition: A | Discharge status: Home / Self Care | Payer: BLUE CROSS/BLUE SHIELD | Attending: Family Medicine | Admitting: Family Medicine

## 2016-07-18 DIAGNOSIS — M722 Plantar fascial fibromatosis: Secondary | ICD-10-CM

## 2016-07-18 DIAGNOSIS — N3000 Acute cystitis without hematuria: Secondary | ICD-10-CM

## 2016-07-18 DIAGNOSIS — M66871 Spontaneous rupture of other tendons, right ankle and foot: Secondary | ICD-10-CM

## 2016-07-18 DIAGNOSIS — M6740 Ganglion, unspecified site: Secondary | ICD-10-CM

## 2016-07-18 HISTORY — DX: Anxiety disorder, unspecified: F41.9

## 2016-07-18 HISTORY — DX: Depression, unspecified: F32.A

## 2016-07-18 HISTORY — DX: Major depressive disorder, single episode, unspecified: F32.9

## 2016-07-18 HISTORY — DX: Psoriasis, unspecified: L40.9

## 2016-07-18 LAB — URINALYSIS, COMPLETE (UACMP) WITH MICROSCOPIC
Bilirubin Urine: NEGATIVE
Glucose, UA: NEGATIVE mg/dL
Nitrite: POSITIVE — AB
PROTEIN: 30 mg/dL — AB
SQUAMOUS EPITHELIAL / LPF: NONE SEEN
Specific Gravity, Urine: >1.03 — ABNORMAL HIGH (ref 1.005–1.030)
pH: 5.5 (ref 5.0–8.0)

## 2016-07-18 MED ORDER — NITROFURANTOIN MONOHYD MACRO 100 MG PO CAPS: 100.0000 mg | ORAL_CAPSULE | Freq: Two times a day (BID) | ORAL | 0 refills | Status: DC

## 2016-07-18 MED ORDER — PHENAZOPYRIDINE HCL 200 MG PO TABS: 200.0000 mg | ORAL_TABLET | Freq: Three times a day (TID) | ORAL | 0 refills | Status: DC | PRN

## 2016-07-18 MED ORDER — FLUCONAZOLE 150 MG PO TABS: 150.0000 mg | ORAL_TABLET | Freq: Once | ORAL | 0 refills | Status: AC

## 2016-07-18 NOTE — ED Provider Notes (Signed)
[facility]    CSN: [id number] Arrival date & time: 07/18/16  1635     History   Chief Complaint Chief Complaint  Patient presents with  . Urinary Urgency    HPI Meghan Weeks is a 46 y.o. female.   Patient is a 46 year old white female reports having symptoms of UTI. Started on Friday is progressively gotten worse. She reports she is tried to curette but drinking water and hydrate herself despite these efforts and hasn't helped much. Is progressively getting worse now she started having some right lower abdominal pain so because of the persistent symptoms and worsening of her symptoms she came in to be seen and evaluated. Past smoker history she is unable tolerate codeine she has a history of anxiety and depression and psoriasis. She's had tendon tear in her right leg and some crutches. She's had little malignant neoplasm of the skin nonmelanoma skin cancer as well as rectocele the past. Abdominal hysterectomy and foot surgery. No pertinent family history relevant to today's visit. She does not smoke. She denies any dyspareunia or vaginal discharge or vaginal irritation.   The history is provided by the patient. No language interpreter was used.  Urinary Frequency  This is a new problem. The current episode started more than 2 days ago. The problem occurs constantly. The problem has been gradually worsening. Associated symptoms include abdominal pain. Pertinent negatives include no chest pain, no headaches and no shortness of breath. Nothing aggravates the symptoms. Nothing relieves the symptoms. She has tried water for the symptoms. The treatment provided no relief.    Past Medical History:  Diagnosis Date  . Anxiety   . Depression   . Psoriasis     Patient Active Problem List   Diagnosis Date Noted  . Chronic pain of right knee 05/18/2016  . History of nonmelanoma skin cancer 07/05/2014  . Personal history of other malignant neoplasm of skin 07/05/2014  .  Other specified symptoms and signs involving the digestive system and abdomen 07/27/2011  . Symptoms involving digestive system 07/27/2011  . Rectocele 06/25/2011    Past Surgical History:  Procedure Laterality Date  . ABDOMINAL HYSTERECTOMY    . FOOT SURGERY Right     OB History    No data available       Home Medications    Prior to Admission medications   Medication Sig Start Date End Date Taking? Authorizing Provider  ibuprofen (MOTRIN IB) 200 MG tablet Take 3 tablets (600 mg total) by mouth every 6 (six) hours as needed. 02/15/15  Yes Carrie Mew, MD  mometasone (ELOCON) 0.1 % cream Apply topically. 04/25/16  Yes Historical Provider, MD  tretinoin (RETIN-A) 0.025 % cream  10/04/14  Yes Historical Provider, MD  albuterol (VENTOLIN HFA) 108 (90 Base) MCG/ACT inhaler  05/17/15   Historical Provider, MD  buPROPion (WELLBUTRIN XL) 150 MG 24 hr tablet Take 150 mg by mouth. 06/25/11   Historical Provider, MD  diazepam (VALIUM) 5 MG tablet Take 1 tablet (5 mg total) by mouth every 8 (eight) hours as needed for muscle spasms. Patient not taking: Reported on 05/18/2016 02/15/15   Carrie Mew, MD  fluconazole (DIFLUCAN) 150 MG tablet Take 1 tablet (150 mg total) by mouth once. 07/18/16 07/18/16  Frederich Cha, MD  hydrochlorothiazide (MICROZIDE) 12.5 MG capsule Take by mouth.    Historical Provider, MD  Levocarnitine HCl POWD Use. Reported on 07/30/2015    Historical Provider, MD  meloxicam (MOBIC) 15 MG tablet Take 1 tablet (  15 mg total) by mouth daily. Patient not taking: Reported on 05/18/2016 01/25/16   Max T Hyatt, DPM  nitrofurantoin, macrocrystal-monohydrate, (MACROBID) 100 MG capsule Take 1 capsule (100 mg total) by mouth 2 (two) times daily. 07/18/16   Frederich Cha, MD  phenazopyridine (PYRIDIUM) 200 MG tablet Take 1 tablet (200 mg total) by mouth 3 (three) times daily as needed for pain. 07/18/16   Frederich Cha, MD  sertraline (ZOLOFT) 100 MG tablet Take 100 mg by mouth. 06/25/11    Historical Provider, MD    Family History History reviewed. No pertinent family history.  Social History Social History  Substance Use Topics  . Smoking status: Never Smoker  . Smokeless tobacco: Never Used  . Alcohol use No     Allergies   Other   Review of Systems Review of Systems  Respiratory: Negative for shortness of breath.   Cardiovascular: Negative for chest pain.  Gastrointestinal: Positive for abdominal pain.  Genitourinary: Positive for frequency.  Musculoskeletal: Positive for joint swelling and myalgias.  Neurological: Negative for headaches.  All other systems reviewed and are negative.    Physical Exam Triage Vital Signs ED Triage Vitals  Enc Vitals Group     BP 07/18/16 1741 101/62     Pulse Rate 07/18/16 1741 61     Resp 07/18/16 1741 16     Temp 07/18/16 1741 98 F (36.7 C)     Temp Source 07/18/16 1741 Oral     SpO2 07/18/16 1741 100 %     Weight 07/18/16 1743 179 lb (81.2 kg)     Height 07/18/16 1743 5\' 6"  (1.676 m)     Head Circumference --      Peak Flow --      Pain Score 07/18/16 1749 3     Pain Loc --      Pain Edu? --      Excl. in [place]? --    No data found.   Updated Vital Signs BP 101/62 (BP Location: Left Arm)   Pulse 61   Temp 98 F (36.7 C) (Oral)   Resp 16   Ht 5\' 6"  (1.676 m)   Wt 179 lb (81.2 kg)   SpO2 100%   BMI 28.89 kg/m   Visual Acuity Right Eye Distance:   Left Eye Distance:   Bilateral Distance:    Right Eye Near:   Left Eye Near:    Bilateral Near:     Physical Exam  Constitutional: She is oriented to person, place, and time. She appears well-developed and well-nourished.  HENT:  Head: Normocephalic and atraumatic.  Right Ear: External ear normal.  Left Ear: External ear normal.  Eyes: Pupils are equal, round, and reactive to light.  Neck: Normal range of motion. Neck supple.  Pulmonary/Chest: Effort normal.  Abdominal: Soft. She exhibits no distension. There is tenderness.     Musculoskeletal: Normal range of motion.  Neurological: She is alert and oriented to person, place, and time.  Skin: Skin is warm.  Psychiatric: She has a normal mood and affect.  Vitals reviewed.    UC Treatments / Results  Labs (all labs ordered are listed, but only abnormal results are displayed) Labs Reviewed  URINALYSIS, COMPLETE (UACMP) WITH MICROSCOPIC - Abnormal; Notable for the following:       Result Value   APPearance CLOUDY (*)    Specific Gravity, Urine >1.030 (*)    Hgb urine dipstick TRACE (*)    Ketones, ur TRACE (*)  Protein, ur 30 (*)    Nitrite POSITIVE (*)    Leukocytes, UA LARGE (*)    Bacteria, UA MANY (*)    All other components within normal limits  URINE CULTURE    EKG  EKG Interpretation None       Radiology No results found.  Procedures Procedures (including critical care time)  Medications Ordered in UC Medications - No data to display  Results for orders placed or performed during the hospital encounter of 07/18/16  Urinalysis, Complete w Microscopic  Result Value Ref Range   Color, Urine YELLOW YELLOW   APPearance CLOUDY (A) CLEAR   Specific Gravity, Urine >1.030 (H) 1.005 - 1.030   pH 5.5 5.0 - 8.0   Glucose, UA NEGATIVE NEGATIVE mg/dL   Hgb urine dipstick TRACE (A) NEGATIVE   Bilirubin Urine NEGATIVE NEGATIVE   Ketones, ur TRACE (A) NEGATIVE mg/dL   Protein, ur 30 (A) NEGATIVE mg/dL   Nitrite POSITIVE (A) NEGATIVE   Leukocytes, UA LARGE (A) NEGATIVE   Squamous Epithelial / LPF NONE SEEN NONE SEEN   WBC, UA TOO NUMEROUS TO COUNT 0 - 5 WBC/hpf   RBC / HPF 0-5 0 - 5 RBC/hpf   Bacteria, UA MANY (A) NONE SEEN   Initial Impression / Assessment and Plan / UC Course  I have reviewed the triage vital signs and the nursing notes.  Pertinent labs & imaging results that were available during my care of the patient were reviewed by me and considered in my medical decision making (see chart for details).   will place patient  on Macrobid 100 mg 1 capsule twice a day pH is 5.5. Place on Diflucan as needed #21 now symptoms appear and repeat one week later if needed and will also place him Pyridium 200 mg 3 times a day for the next 5 days as well.   Final Clinical Impressions(s) / UC Diagnoses   Final diagnoses:  Acute cystitis without hematuria    New Prescriptions Discharge Medication List as of 07/18/2016  7:13 PM    START taking these medications   Details  fluconazole (DIFLUCAN) 150 MG tablet Take 1 tablet (150 mg total) by mouth once., Starting Wed 07/18/2016, Normal    nitrofurantoin, macrocrystal-monohydrate, (MACROBID) 100 MG capsule Take 1 capsule (100 mg total) by mouth 2 (two) times daily., Starting Wed 07/18/2016, Normal    phenazopyridine (PYRIDIUM) 200 MG tablet Take 1 tablet (200 mg total) by mouth 3 (three) times daily as needed for pain., Starting Wed 07/18/2016, Normal       Note: This dictation was prepared with Dragon dictation along with smaller phrase technology. Any transcriptional errors that result from this process are unintentional.   Frederich Cha, MD 07/18/16 [phone]

## 2016-07-18 NOTE — ED Triage Notes (Signed)
Patient started having symptoms of urinary urgency and eventually burning 4 days ago

## 2016-07-18 NOTE — Progress Notes (Signed)
She presents today for postop visit date of surgery is 05/24/2016 status post EPF right excision ganglion cyst anterolateral right ankle and open repair of her posterior tibial tendon right ankle. She states that everything feels better except for the tendon she presents today utilizing a single crutch in her Cam Walker. He states there is still hurts when she walks around the house without the crutch.  Objective: Vital signs are stable she is alert and oriented 3 minimal edema to the right foot. She has tenderness on inversion against resistance and on palpation of the incision site. All of the other surgical sites were gone on to heal uneventfully without any pain. Posterior tibial tendon was the worst.  Assessment: Well-healing surgical foot.  Plan: Encourage range of motion exercises ice therapy and shoe gear modifications. I also recommended that she utilize her Tri-Lock brace that she has at home and to try to get into her tennis shoes. I also recommended that she try to get away from her crutches. If she is not better next visit and we will consider physical therapy.

## 2016-07-21 LAB — URINE CULTURE

## 2016-08-08 ENCOUNTER — Ambulatory Visit (INDEPENDENT_AMBULATORY_CARE_PROVIDER_SITE_OTHER): Payer: BLUE CROSS/BLUE SHIELD | Admitting: Podiatry

## 2016-08-08 ENCOUNTER — Encounter: Payer: Self-pay | Admitting: Podiatry

## 2016-08-08 DIAGNOSIS — M66871 Spontaneous rupture of other tendons, right ankle and foot: Secondary | ICD-10-CM

## 2016-08-08 DIAGNOSIS — M722 Plantar fascial fibromatosis: Secondary | ICD-10-CM

## 2016-08-08 DIAGNOSIS — M6740 Ganglion, unspecified site: Secondary | ICD-10-CM

## 2016-08-08 NOTE — Progress Notes (Signed)
Meghan Weeks presents today for follow-up of her endoscopic plantar fasciotomy excision lipoma anterolateral ankle and repair of her posterior tibial tendon with the cast right. She states that this foot is still giving me trouble that she states that every day it's a little better. She states there is still just really sore. She states there is no better now than it was prior to surgery.  Objective: Vital signs are stable she is alert and oriented 3. Much decrease in edema overlying the posterior tibial tendon insertion and no longer unable palpate fluid within the tendon sheath.  Pulses remain palpable minimal edema about the right foot.  Assessment: Continued postoperative pain right foot. Nearly 3 months postsurgical.  Plan: At this point we are referring her to physical therapy.

## 2016-08-13 DIAGNOSIS — M25571 Pain in right ankle and joints of right foot: Secondary | ICD-10-CM | POA: Diagnosis not present

## 2016-08-16 DIAGNOSIS — M25571 Pain in right ankle and joints of right foot: Secondary | ICD-10-CM | POA: Diagnosis not present

## 2016-08-21 DIAGNOSIS — M25571 Pain in right ankle and joints of right foot: Secondary | ICD-10-CM | POA: Diagnosis not present

## 2016-08-28 DIAGNOSIS — M25571 Pain in right ankle and joints of right foot: Secondary | ICD-10-CM | POA: Diagnosis not present

## 2016-08-30 DIAGNOSIS — M25571 Pain in right ankle and joints of right foot: Secondary | ICD-10-CM | POA: Diagnosis not present

## 2016-09-04 DIAGNOSIS — M25571 Pain in right ankle and joints of right foot: Secondary | ICD-10-CM | POA: Diagnosis not present

## 2016-09-10 ENCOUNTER — Ambulatory Visit (INDEPENDENT_AMBULATORY_CARE_PROVIDER_SITE_OTHER): Payer: BLUE CROSS/BLUE SHIELD | Admitting: Podiatry

## 2016-09-10 ENCOUNTER — Encounter: Payer: Self-pay | Admitting: Podiatry

## 2016-09-10 DIAGNOSIS — M792 Neuralgia and neuritis, unspecified: Secondary | ICD-10-CM | POA: Diagnosis not present

## 2016-09-10 DIAGNOSIS — M722 Plantar fascial fibromatosis: Secondary | ICD-10-CM

## 2016-09-10 DIAGNOSIS — M66871 Spontaneous rupture of other tendons, right ankle and foot: Secondary | ICD-10-CM

## 2016-09-10 DIAGNOSIS — M6740 Ganglion, unspecified site: Secondary | ICD-10-CM

## 2016-09-10 MED ORDER — PREDNISONE 10 MG (21) PO TBPK: ORAL_TABLET | ORAL | 0 refills | Status: DC

## 2016-09-10 NOTE — Progress Notes (Signed)
She presents today for follow-up of her surgical foot right. Date of surgery 05/24/2016 status post endoscopic plantar fasciotomy excision lipoma anterolateral ankle and open repair of the posterior tibial tendon tear right foot. She states this still swollen and still tender. She continues physical therapy for at least another week or so.  She states that this right leg is always been swollen more than they other leg but has been swollen and has had a problem with swelling since she was a little girl. She cannot wear compression hose but does have diuretics which she does not take regularly. She is also is experiencing pain beneath the first metatarsophalangeal joint of the right foot.  She has compression pumps at home to help with the lymphedema in her legs.  Objective: Vital signs are stable she is alert and oriented 3 pulses are palpable. Surgical sites and gone on to heal uneventfully and there is no pain associated with them. She has swelling but decreased from last visit. Palpable neuroma or fascicle with radiating pain just distal to the tibial sesamoid.  Assessment: Lymphedema bilateral lower extremity status post surgical repair right foot. J[place]lin's neuroma.  Plan: Placed her on a Sterapred dose pack. Injected the painful first metatarsophalangeal joint area plantar medial. Discussed appropriate shoe gear compression hose exercising etc.

## 2016-10-08 ENCOUNTER — Encounter: Payer: Self-pay | Admitting: Podiatry

## 2016-10-08 ENCOUNTER — Ambulatory Visit (INDEPENDENT_AMBULATORY_CARE_PROVIDER_SITE_OTHER): Payer: BLUE CROSS/BLUE SHIELD | Admitting: Podiatry

## 2016-10-08 DIAGNOSIS — D3610 Benign neoplasm of peripheral nerves and autonomic nervous system, unspecified: Secondary | ICD-10-CM

## 2016-11-05 ENCOUNTER — Ambulatory Visit (INDEPENDENT_AMBULATORY_CARE_PROVIDER_SITE_OTHER): Payer: BLUE CROSS/BLUE SHIELD | Admitting: Podiatry

## 2016-11-05 ENCOUNTER — Encounter: Payer: Self-pay | Admitting: Podiatry

## 2016-11-05 DIAGNOSIS — D3610 Benign neoplasm of peripheral nerves and autonomic nervous system, unspecified: Secondary | ICD-10-CM | POA: Diagnosis not present

## 2016-11-05 NOTE — Progress Notes (Signed)
She presents today for follow-up of her J[place]'s neuroma along the tibial sesamoid of the right foot. She states it really hasn't gotten any better with the last injection.  Objective: Reproducible pain today just distal to the tibial sesamoid.  Assessment: Neuritis J[place]lin's neuroma.  Plan: I injected the area today with a second dose of dehydrated alcohol. Follow-up with her in 3 weeks.

## 2016-11-26 ENCOUNTER — Encounter: Payer: Self-pay | Admitting: Podiatry

## 2016-11-26 ENCOUNTER — Ambulatory Visit (INDEPENDENT_AMBULATORY_CARE_PROVIDER_SITE_OTHER): Payer: BLUE CROSS/BLUE SHIELD | Admitting: Podiatry

## 2016-11-26 DIAGNOSIS — D3610 Benign neoplasm of peripheral nerves and autonomic nervous system, unspecified: Secondary | ICD-10-CM

## 2016-11-26 NOTE — Progress Notes (Signed)
She presents today for follow-up of her J[place]lin's neuroma. She states that it was actually doing better for a couple of weeks and now starting to become sore again.  Objective: Vital signs are stable alert and oriented 3. Pulses are palpable. Neurologic sensorium is intact. She has pain on palpation plantar medial nerve branch from the first metatarsophalangeal joint right foot just distal to the tibial sesamoid.  Assessment: [place]'s neuroma neuritis.  Plan: Injected her third dose of dehydrated alcohol follow-up with her in 3 weeks

## 2016-12-06 DIAGNOSIS — M654 Radial styloid tenosynovitis [de Quervain]: Secondary | ICD-10-CM | POA: Diagnosis not present

## 2016-12-06 DIAGNOSIS — J019 Acute sinusitis, unspecified: Secondary | ICD-10-CM | POA: Diagnosis not present

## 2016-12-06 DIAGNOSIS — B9689 Other specified bacterial agents as the cause of diseases classified elsewhere: Secondary | ICD-10-CM | POA: Diagnosis not present

## 2016-12-19 ENCOUNTER — Encounter: Payer: Self-pay | Admitting: Podiatry

## 2016-12-19 ENCOUNTER — Ambulatory Visit (INDEPENDENT_AMBULATORY_CARE_PROVIDER_SITE_OTHER): Payer: BLUE CROSS/BLUE SHIELD | Admitting: Podiatry

## 2016-12-19 DIAGNOSIS — D3610 Benign neoplasm of peripheral nerves and autonomic nervous system, unspecified: Secondary | ICD-10-CM | POA: Diagnosis not present

## 2016-12-19 NOTE — Progress Notes (Signed)
She presents today for follow-up of her job is neuroma first metatarsophalangeal joint right foot. She states his lower back and forth but is feeling better than it was.  Objective: Vital signs are stable she is alert and oriented 3. Pulses are palpable area tenderness between the first and second metatarsal and 21st and second digits.  Job is neuroma.  Plan: Follow-up with her in the near future.

## 2016-12-19 NOTE — Progress Notes (Signed)
She presents today for follow-up of neuroma to the plantar medial first metatarsal phalangeal joint. She states this seems to be doing better and it has improved slightly.  Objective: Vital signs are stable she is alert and oriented 3 and currently unable to palpate the painful mass that was there previously. The pain is still present but not as nearly as acute as previously noted.  Assessment: Resolving neuritis Joplin's neuroma right foot.  Plan: Injected the fourth dose of dehydrated alcohol today and will follow up with her in 1 month

## 2017-01-14 ENCOUNTER — Ambulatory Visit (INDEPENDENT_AMBULATORY_CARE_PROVIDER_SITE_OTHER): Payer: BLUE CROSS/BLUE SHIELD | Admitting: Podiatry

## 2017-01-14 ENCOUNTER — Encounter: Payer: Self-pay | Admitting: Podiatry

## 2017-01-14 DIAGNOSIS — D3610 Benign neoplasm of peripheral nerves and autonomic nervous system, unspecified: Secondary | ICD-10-CM | POA: Diagnosis not present

## 2017-01-14 NOTE — Progress Notes (Signed)
She presents today for follow-up of her Joplin's neuroma she states it is doing much better the nodule has gone down and the pain is not as severe.  Objective: Moderate edema to bilateral lower extremity today lymphedema is a problem for her. She has pain on palpation to the tibial sesamoid area.  Assessment: Joplin's neuroma.  Plan: Injected today with dehydrated alcohol follow-up with her in 3 weeks. Instructed her to follow up with primary care for her fluid pill.

## 2017-01-22 DIAGNOSIS — L601 Onycholysis: Secondary | ICD-10-CM | POA: Diagnosis not present

## 2017-01-22 DIAGNOSIS — L718 Other rosacea: Secondary | ICD-10-CM | POA: Diagnosis not present

## 2017-01-22 DIAGNOSIS — L568 Other specified acute skin changes due to ultraviolet radiation: Secondary | ICD-10-CM | POA: Diagnosis not present

## 2017-01-22 DIAGNOSIS — C44311 Basal cell carcinoma of skin of nose: Secondary | ICD-10-CM | POA: Diagnosis not present

## 2017-02-04 ENCOUNTER — Ambulatory Visit: Payer: BLUE CROSS/BLUE SHIELD | Admitting: Podiatry

## 2017-02-11 ENCOUNTER — Ambulatory Visit (INDEPENDENT_AMBULATORY_CARE_PROVIDER_SITE_OTHER): Payer: BLUE CROSS/BLUE SHIELD | Admitting: Podiatry

## 2017-02-11 ENCOUNTER — Encounter: Payer: Self-pay | Admitting: Podiatry

## 2017-02-11 DIAGNOSIS — D3610 Benign neoplasm of peripheral nerves and autonomic nervous system, unspecified: Secondary | ICD-10-CM

## 2017-02-11 NOTE — Progress Notes (Signed)
She presents today for follow-up of her Joplin's neuroma to the plantar medial aspect of the first metatarsophalangeal joint. She states that not only has not improved since last visit but he seems to have gotten worse. She states that the outside of her foot and she points to the fifth metatarsal area is also painful as well as the first metatarsal phalangeal joint itself.  Objective: She is range of motion of the first metatarsophalangeal joint and on palpation of the tibial sesamoid. Radiographs was again reviewed did only demonstrate normal osseous architecture as he no signs of fracture however does not mean that there cannot be bony edema or sesamoiditis at this point. Also to be a tear in the suspensory ligament or a neuroma that is present.  Assessment: Rule out Joplin's neuroma first metatarsophalangeal joint and sesamoiditis. Lateral capsulitis around the fifth metatarsal cuboid articulation.  Plan: Requesting an MRI to evaluate forefoot since all conservative therapies have failed at this point. More than likely this is going to be a surgical intervention.

## 2017-02-14 ENCOUNTER — Telehealth: Payer: Self-pay

## 2017-02-14 NOTE — Addendum Note (Signed)
Addended by: Graceann Congress D on: 02/14/2017 11:48 AM   Modules accepted: Orders

## 2017-02-14 NOTE — Telephone Encounter (Signed)
Per Lowell Bouton. With [employer], no prior auth required for MRI Patient has been notified and she will schedule her own appt at [facility] hospital

## 2017-02-14 NOTE — Telephone Encounter (Signed)
-----   Message from Rip Harbour, [facility] sent at 02/11/2017  4:47 PM EDT ----- Regarding: MRI  MRI right foot - evaluate pain at lateral foot, 1st MPJ/sesamoiditis right

## 2017-02-19 ENCOUNTER — Ambulatory Visit (HOSPITAL_COMMUNITY)
Admission: RE | Admit: 2017-02-19 | Discharge: 2017-02-19 | Disposition: A | Discharge status: Home / Self Care | Payer: BLUE CROSS/BLUE SHIELD | Source: Ambulatory Visit | Attending: Podiatry | Admitting: Podiatry

## 2017-02-19 DIAGNOSIS — M7989 Other specified soft tissue disorders: Secondary | ICD-10-CM | POA: Insufficient documentation

## 2017-02-19 DIAGNOSIS — M79671 Pain in right foot: Secondary | ICD-10-CM | POA: Diagnosis not present

## 2017-02-19 DIAGNOSIS — D3610 Benign neoplasm of peripheral nerves and autonomic nervous system, unspecified: Secondary | ICD-10-CM | POA: Diagnosis not present

## 2017-02-27 ENCOUNTER — Ambulatory Visit
Admission: EM | Admit: 2017-02-27 | Discharge: 2017-02-27 | Disposition: A | Discharge status: Home / Self Care | Payer: BLUE CROSS/BLUE SHIELD | Attending: Family Medicine | Admitting: Family Medicine

## 2017-02-27 ENCOUNTER — Encounter: Payer: Self-pay | Admitting: Emergency Medicine

## 2017-02-27 DIAGNOSIS — R82998 Other abnormal findings in urine: Secondary | ICD-10-CM

## 2017-02-27 DIAGNOSIS — R202 Paresthesia of skin: Secondary | ICD-10-CM | POA: Diagnosis not present

## 2017-02-27 DIAGNOSIS — R109 Unspecified abdominal pain: Secondary | ICD-10-CM | POA: Diagnosis not present

## 2017-02-27 LAB — URINALYSIS, COMPLETE (UACMP) WITH MICROSCOPIC
BILIRUBIN URINE: NEGATIVE
Glucose, UA: NEGATIVE mg/dL
Hgb urine dipstick: NEGATIVE
Ketones, ur: NEGATIVE mg/dL
Leukocytes, UA: NEGATIVE
NITRITE: NEGATIVE
PH: 6.5 (ref 5.0–8.0)
Protein, ur: NEGATIVE mg/dL
SPECIFIC GRAVITY, URINE: 1.025 (ref 1.005–1.030)

## 2017-02-27 MED ORDER — TAMSULOSIN HCL 0.4 MG PO CAPS: 0.4000 mg | ORAL_CAPSULE | Freq: Every day | ORAL | 0 refills | Status: DC

## 2017-02-27 NOTE — ED Provider Notes (Signed)
[facility]    CSN: [id number] Arrival date & time: 02/27/17  1736     History   Chief Complaint Chief Complaint  Patient presents with  . Flank Pain    right  . Numbness    left arm    HPI Meghan Weeks is a 46 y.o. female.   46 yo female with a 1 month h/o intermittent right sided flank pain and intermittent urinary urgency. Denies any fevers, chills, dysuria, hematuria.   Also complains of over 1 month h/o intermittent left arm numbness, associated with position changes. Denies any facial numbness, leg numbness, speech problems, one-sided weakness, vision changes, severe headaches, palpitations, chest pains, shortness of breath.      The history is provided by the patient.    Past Medical History:  Diagnosis Date  . Anxiety   . Depression   . Psoriasis     Patient Active Problem List   Diagnosis Date Noted  . Chronic pain of right knee 05/18/2016  . History of nonmelanoma skin cancer 07/05/2014  . Personal history of other malignant neoplasm of skin 07/05/2014  . Other specified symptoms and signs involving the digestive system and abdomen 07/27/2011  . Symptoms involving digestive system 07/27/2011  . Rectocele 06/25/2011    Past Surgical History:  Procedure Laterality Date  . ABDOMINAL HYSTERECTOMY    . FOOT SURGERY Right     OB History    No data available       Home Medications    Prior to Admission medications   Medication Sig Start Date End Date Taking? Authorizing Provider  b complex vitamins tablet Take 1 tablet by mouth daily.   Yes [provider]  vitamin C (ASCORBIC ACID) 500 MG tablet Take 500 mg by mouth daily.   Yes [provider]  albuterol (VENTOLIN HFA) 108 (90 Base) MCG/ACT inhaler  05/17/15   [provider]  tamsulosin (FLOMAX) 0.4 MG CAPS capsule Take 1 capsule (0.4 mg total) by mouth daily. 02/27/17   Norval Gable, MD    Family History History reviewed. No pertinent family  history.  Social History Social History  Substance Use Topics  . Smoking status: Never Smoker  . Smokeless tobacco: Never Used  . Alcohol use No     Allergies   Other   Review of Systems Review of Systems   Physical Exam Triage Vital Signs ED Triage Vitals  Enc Vitals Group     BP 02/27/17 1823 111/65     Pulse Rate 02/27/17 1823 65     Resp 02/27/17 1823 16     Temp 02/27/17 1823 98.2 F (36.8 C)     Temp Source 02/27/17 1823 Oral     SpO2 02/27/17 1823 100 %     Weight 02/27/17 1823 189 lb (85.7 kg)     Height 02/27/17 1823 5\' 5"  (1.651 m)     Head Circumference --      Peak Flow --      Pain Score 02/27/17 1824 5     Pain Loc --      Pain Edu? --      Excl. in [place]? --    No data found.   Updated Vital Signs BP 111/65 (BP Location: Left Arm)   Pulse 65   Temp 98.2 F (36.8 C) (Oral)   Resp 16   Ht 5\' 5"  (1.651 m)   Wt 189 lb (85.7 kg)   SpO2 100%   BMI 31.45 kg/m  Visual Acuity Right Eye Distance:   Left Eye Distance:   Bilateral Distance:    Right Eye Near:   Left Eye Near:    Bilateral Near:     Physical Exam  Constitutional: She is oriented to person, place, and time. She appears well-developed and well-nourished. No distress.  Abdominal: Soft. Bowel sounds are normal. She exhibits no distension and no mass. There is no tenderness. There is no rebound and no guarding.  Musculoskeletal:       Cervical back: Normal.  Neurological: She is alert and oriented to person, place, and time. She displays normal reflexes. No cranial nerve deficit or sensory deficit. She exhibits normal muscle tone. Coordination normal.  Skin: She is not diaphoretic.  Nursing note and vitals reviewed.    UC Treatments / Results  Labs (all labs ordered are listed, but only abnormal results are displayed) Labs Reviewed  URINALYSIS, COMPLETE (UACMP) WITH MICROSCOPIC - Abnormal; Notable for the following:       Result Value   Squamous Epithelial / LPF 0-5 (*)     Bacteria, UA RARE (*)    All other components within normal limits    EKG  EKG Interpretation None       Radiology No results found.  Procedures Procedures (including critical care time)  Medications Ordered in UC Medications - No data to display   Initial Impression / Assessment and Plan / UC Course  I have reviewed the triage vital signs and the nursing notes.  Pertinent labs & imaging results that were available during my care of the patient were reviewed by me and considered in my medical decision making (see chart for details).       Final Clinical Impressions(s) / UC Diagnoses   Final diagnoses:  Right flank pain  Crystalluria  Paresthesia    New Prescriptions Discharge Medication List as of 02/27/2017  7:22 PM    START taking these medications   Details  tamsulosin (FLOMAX) 0.4 MG CAPS capsule Take 1 capsule (0.4 mg total) by mouth daily., Starting Wed 02/27/2017, Normal       1. Lab results and diagnosis reviewed with patient 2. rx as per orders above; reviewed possible side effects, interactions, risks and benefits  3. Recommend supportive treatment with increased water intake  4. Establish care with PCP; schedule appointment with neurologist and urologist for follow up  5.  Follow-up prn if symptoms worsen or don't improve  Controlled Substance Prescriptions [place] Controlled Substance Registry consulted? Not Applicable   Norval Gable, MD 02/27/17 2102

## 2017-02-27 NOTE — ED Triage Notes (Signed)
Patient c/o right sided flank pain and increase in urinary urgency for a month.  Patient also reports left arm numbness for over a month.

## 2017-03-04 ENCOUNTER — Ambulatory Visit (INDEPENDENT_AMBULATORY_CARE_PROVIDER_SITE_OTHER): Payer: BLUE CROSS/BLUE SHIELD | Admitting: Podiatry

## 2017-03-04 ENCOUNTER — Encounter: Payer: Self-pay | Admitting: Podiatry

## 2017-03-04 DIAGNOSIS — D3610 Benign neoplasm of peripheral nerves and autonomic nervous system, unspecified: Secondary | ICD-10-CM

## 2017-03-04 NOTE — Progress Notes (Signed)
She presents today for her MRI results. She states that the foot is doing a little bit better.  Objective: Vital signs are stable she is alert and oriented 3 still has pain just distal to the tibial sesamoid plantar medial. According to the MRI this is probably a ganglion or a neuroma. She still has some tenderness on palpation of the tibialis anterior is insertion site and the lateral foot Gen.  Assessment: Neuroma, Joplin's neuroma.  Plan: After discussing surgical options we have decided to continue with dehydrated alcohol injections and she does seem to be doing better. Follow up with her in 3 weeks

## 2017-03-06 DIAGNOSIS — L814 Other melanin hyperpigmentation: Secondary | ICD-10-CM | POA: Diagnosis not present

## 2017-03-06 DIAGNOSIS — L578 Other skin changes due to chronic exposure to nonionizing radiation: Secondary | ICD-10-CM | POA: Diagnosis not present

## 2017-03-06 DIAGNOSIS — L988 Other specified disorders of the skin and subcutaneous tissue: Secondary | ICD-10-CM | POA: Diagnosis not present

## 2017-03-06 DIAGNOSIS — C44311 Basal cell carcinoma of skin of nose: Secondary | ICD-10-CM | POA: Diagnosis not present

## 2017-03-06 HISTORY — PX: BASAL CELL CARCINOMA EXCISION: SHX1214

## 2017-03-08 NOTE — Progress Notes (Signed)
03/11/2017 10:18 AM   Meghan Weeks 05-23-1971 [id number]  Referring provider: No referring provider defined for this encounter.  Chief Complaint  Patient presents with  . New Patient (Initial Visit)    Incomplete emptying / urinary frequency referred by self     HPI: Patient is a 46 year old Caucasian female who presents today for the complaints of right sided pain, feelings of incomplete emptying and urinary frequency.    She states that she has a remote history of recurrent UTI's.  She was fine for several years.  Then last year, the symptoms returned.  She has had documented UTI in 06/2016 for E. Coli and Klebsiella.    She states she has the sensation that the urine is going up to her right kidney.  This occurs during episodes of urgency.  She has not been diagnosed with reflux.  She states that she was never tested for reflux as a child.  She did state that she had a lot of urinary issues growing up, wetting the bed and recurrent UTI's.  She does not recall having high fevers with her infections or being hospitalized.    She is having frequency, nocturia and intermittency.                Her UA today was negative.   Her PVR today is 0 mL.  She has sleep apnea and is sleeping with a CPAP machine.  An abdominal ultrasound performed in 2015 noted cortical thinning of the right kidney.   She is drinking 30 ounces of water daily.  She is drinking a large sweet tea, 20 ounces daily.  No sodas.  Coffee daily.      PMH: Past Medical History:  Diagnosis Date  . Anxiety   . Depression   . History of basal cell cancer   . Neuroma of foot   . Psoriasis     Surgical History: Past Surgical History:  Procedure Laterality Date  . ABDOMINAL HYSTERECTOMY    . FOOT SURGERY Right   . gastric sleeve surgery  2013   with repair of hiatal hernia  . right elbow surgery    . right wrist surgery      Home Medications:  Allergies as of 03/11/2017      Reactions   Other Other  (See Comments)   Other reaction(s): Headache codeine-guaifenesin cough syrup Other reaction(s): Other (See Comments) codeine-guaifenesin cough syrup codeine-guaifenesin cough syrup      Medication List       Accurate as of 03/11/17 10:18 AM. Always use your most recent med list.          Alpha-Lipoic Acid 600 MG Caps Take by mouth.   b complex vitamins tablet Take 1 tablet by mouth daily.   buPROPion 150 MG 24 hr tablet Commonly known as:  WELLBUTRIN XL Take by mouth.   cortisone 25 MG tablet Commonly known as:  CORTONE Take by mouth.   hydrochlorothiazide 12.5 MG capsule Commonly known as:  MICROZIDE Take by mouth.   HYDROcodone-acetaminophen 5-325 MG tablet Commonly known as:  NORCO/VICODIN Take by mouth.   ibuprofen 200 MG tablet Commonly known as:  ADVIL,MOTRIN Take 600 mg by mouth.   Levocarnitine HCl Powd Use. Reported on 07/30/2015   metroNIDAZOLE 0.75 % gel Commonly known as:  METROGEL Apply topically.   mometasone 0.1 % cream Commonly known as:  ELOCON Apply topically.   NONFORMULARY OR COMPOUNDED ITEM Doltera Oils   predniSONE 10 MG (21)  Tbpk tablet Commonly known as:  STERAPRED UNI-PAK 21 TAB 6 day taper. Take as directed with food   sertraline 100 MG tablet Commonly known as:  ZOLOFT Take by mouth.   tamsulosin 0.4 MG Caps capsule Commonly known as:  FLOMAX Take 1 capsule (0.4 mg total) by mouth daily.   tretinoin 0.025 % cream Commonly known as:  RETIN-A   VENTOLIN HFA 108 (90 Base) MCG/ACT inhaler Generic drug:  albuterol   VITAMIN B 12 PO Take by mouth.   vitamin C 500 MG tablet Commonly known as:  ASCORBIC ACID Take 500 mg by mouth daily.       Allergies:  Allergies  Allergen Reactions  . Other Other (See Comments)    Other reaction(s): Headache codeine-guaifenesin cough syrup Other reaction(s): Other (See Comments) codeine-guaifenesin cough syrup codeine-guaifenesin cough syrup    Family History: Family  History  Problem Relation Age of Onset  . Hematuria Mother   . Diabetes Father   . Hypertension Father   . Diabetes Sister   . Hypertension Sister   . Colon cancer Maternal Grandmother   . Uterine cancer Maternal Grandmother   . Kidney cancer Neg Hx   . Bladder Cancer Neg Hx     Social History:  reports that she quit smoking about 23 years ago. Her smoking use included Cigarettes. She has never used smokeless tobacco. She reports that she drinks alcohol. She reports that she does not use drugs.  ROS: UROLOGY Frequent Urination?: Yes Hard to postpone urination?: No Burning/pain with urination?: No Get up at night to urinate?: Yes Leakage of urine?: No Urine stream starts and stops?: Yes Trouble starting stream?: No Do you have to strain to urinate?: No Blood in urine?: No Urinary tract infection?: No Sexually transmitted disease?: No Injury to kidneys or bladder?: No Painful intercourse?: Yes Weak stream?: No Currently pregnant?: No Vaginal bleeding?: No Last menstrual period?: 2006  Gastrointestinal Nausea?: No Vomiting?: No Indigestion/heartburn?: No Diarrhea?: No Constipation?: No  Constitutional Fever: No Night sweats?: Yes Weight loss?: No Fatigue?: Yes  Skin Skin rash/lesions?: Yes Itching?: Yes  Eyes Blurred vision?: No Double vision?: No  Ears/Nose/Throat Sore throat?: No Sinus problems?: No  Hematologic/Lymphatic Swollen glands?: No Easy bruising?: Yes  Cardiovascular Leg swelling?: Yes Chest pain?: No  Respiratory Cough?: No Shortness of breath?: No  Endocrine Excessive thirst?: No  Musculoskeletal Back pain?: No Joint pain?: No  Neurological Headaches?: No Dizziness?: No  Psychologic Depression?: Yes Anxiety?: Yes  Physical Exam: BP 118/75   Pulse 69   Ht 5\' 5"  (1.651 m)   Wt 198 lb 4.8 oz (89.9 kg)   BMI 33.00 kg/m   Constitutional: Well nourished. Alert and oriented, No acute distress. HEENT: [place] AT, moist  mucus membranes. Trachea midline, no masses. Cardiovascular: No clubbing, cyanosis, or edema. Respiratory: Normal respiratory effort, no increased work of breathing. GI: Abdomen is soft, non tender, non distended, no abdominal masses. Liver and spleen not palpable.  No hernias appreciated.  Stool sample for occult testing is not indicated.   GU: No CVA tenderness.  No bladder fullness or masses.  Normal external genitalia, normal pubic hair distribution, no lesions.  Normal urethral meatus, no lesions, no prolapse, no discharge.   No urethral masses, tenderness and/or tenderness. No bladder fullness, tenderness or masses. Normal vagina mucosa, good estrogen effect, no discharge, no lesions, good pelvic support, Grade II cystocele is noted.  Rectocele noted.  Cervix and uterus are surgically absent.  No adnexal/parametria masses or tenderness  noted.  Anus and perineum are without rashes or lesions.    Skin: No rashes, bruises or suspicious lesions. Lymph: No cervical or inguinal adenopathy. Neurologic: Grossly intact, no focal deficits, moving all 4 extremities. Psychiatric: Normal mood and affect.  Laboratory Data:  Urinalysis Negative.  See EPIC.    I have reviewed the labs.   Pertinent Imaging: Results for Meghan Weeks, Meghan Weeks (MRN [id number]) as of 03/11/2017 09:43  Ref. Range 03/11/2017 09:35  Scan Result Unknown 0   I have independently reviewed the films.    Assessment & Plan:    1. Feelings of incomplete bladder emptying  - PVR is 0 mL and UA is negative - likely due to OAB  2. Right sided pain  - cortical thinning of right kidney seen on 2015 ultrasound  - obtain RUS  - RTC for results  3. Frequency  - patient advised to top consumption of sweet tea and frappes  - will reassess when she returns for RUS report  4. Cortical thinning of the right kidney  - Patient may have a history of reflux  - BMP drawn today  - Renal ultrasound is pending at this time  Encouraged the  patient to get established with a PCP  Return for for renal ultrasound report.  These notes generated with voice recognition software. I apologize for typographical errors.  Zara Council, [facility] [address], [place], [place] [address] [phone]

## 2017-03-11 ENCOUNTER — Ambulatory Visit (INDEPENDENT_AMBULATORY_CARE_PROVIDER_SITE_OTHER): Payer: BLUE CROSS/BLUE SHIELD | Admitting: Urology

## 2017-03-11 ENCOUNTER — Encounter: Payer: Self-pay | Admitting: Urology

## 2017-03-11 VITALS — BP 118/75 | HR 69 | Ht 65.0 in | Wt 198.3 lb

## 2017-03-11 DIAGNOSIS — R350 Frequency of micturition: Secondary | ICD-10-CM

## 2017-03-11 DIAGNOSIS — R93429 Abnormal radiologic findings on diagnostic imaging of unspecified kidney: Secondary | ICD-10-CM

## 2017-03-11 DIAGNOSIS — R109 Unspecified abdominal pain: Secondary | ICD-10-CM

## 2017-03-11 DIAGNOSIS — R3914 Feeling of incomplete bladder emptying: Secondary | ICD-10-CM

## 2017-03-11 LAB — URINALYSIS, COMPLETE
Bilirubin, UA: NEGATIVE
Glucose, UA: NEGATIVE
Ketones, UA: NEGATIVE
Nitrite, UA: NEGATIVE
PH UA: 6 (ref 5.0–7.5)
PROTEIN UA: NEGATIVE
RBC UA: NEGATIVE
SPEC GRAV UA: 1.02 (ref 1.005–1.030)
Urobilinogen, Ur: 0.2 mg/dL (ref 0.2–1.0)

## 2017-03-11 LAB — BLADDER SCAN AMB NON-IMAGING: Scan Result: 0

## 2017-03-12 LAB — BASIC METABOLIC PANEL
BUN / CREAT RATIO: 15 (ref 9–23)
BUN: 16 mg/dL (ref 6–24)
CHLORIDE: 102 mmol/L (ref 96–106)
CO2: 27 mmol/L (ref 20–29)
Calcium: 9 mg/dL (ref 8.7–10.2)
Creatinine, Ser: 1.04 mg/dL — ABNORMAL HIGH (ref 0.57–1.00)
GFR calc non Af Amer: 64 mL/min/{1.73_m2} (ref >59)
GFR, EST AFRICAN AMERICAN: 74 mL/min/{1.73_m2} (ref >59)
Glucose: 79 mg/dL (ref 65–99)
Potassium: 3.8 mmol/L (ref 3.5–5.2)
SODIUM: 144 mmol/L (ref 134–144)

## 2017-03-13 ENCOUNTER — Telehealth: Payer: Self-pay

## 2017-03-13 NOTE — Telephone Encounter (Signed)
-----   Message from Nori Riis, PA-C sent at 03/12/2017  8:01 AM EDT ----- Please let Meghan Weeks no that her kidney function seems to be somewhat stable. I would increase her water intake and avoid NSAIDs if possible.

## 2017-03-13 NOTE — Telephone Encounter (Signed)
Mailbox full and not able to leave a message.

## 2017-03-13 NOTE — Telephone Encounter (Signed)
Patient called back and said she was taking ibuprofen for her nose surgery and I spoke with Ramona and she said for her to keep taking it as instructed but if she didn't need it to try and not take it. Patient understood   Sharyn Lull

## 2017-03-20 ENCOUNTER — Ambulatory Visit
Admission: RE | Admit: 2017-03-20 | Discharge: 2017-03-20 | Disposition: A | Discharge status: Home / Self Care | Payer: BLUE CROSS/BLUE SHIELD | Source: Ambulatory Visit | Attending: Urology | Admitting: Urology

## 2017-03-20 DIAGNOSIS — R93429 Abnormal radiologic findings on diagnostic imaging of unspecified kidney: Secondary | ICD-10-CM | POA: Insufficient documentation

## 2017-03-20 DIAGNOSIS — N261 Atrophy of kidney (terminal): Secondary | ICD-10-CM | POA: Diagnosis not present

## 2017-03-20 DIAGNOSIS — N281 Cyst of kidney, acquired: Secondary | ICD-10-CM | POA: Diagnosis not present

## 2017-03-25 ENCOUNTER — Ambulatory Visit (INDEPENDENT_AMBULATORY_CARE_PROVIDER_SITE_OTHER): Payer: BLUE CROSS/BLUE SHIELD | Admitting: Podiatry

## 2017-03-25 ENCOUNTER — Encounter: Payer: Self-pay | Admitting: Podiatry

## 2017-03-25 DIAGNOSIS — D3610 Benign neoplasm of peripheral nerves and autonomic nervous system, unspecified: Secondary | ICD-10-CM | POA: Diagnosis not present

## 2017-03-25 NOTE — Progress Notes (Signed)
She presents today for follow-up of her jaw seroma states that it seems to be doing the best is felt yet.  Objective: Vital signs are stable alert and oriented 3. There is mild tenderness on palpation of the tibial sesamoid and the adjacent nerve.  Assessment: Neuritis [place]'s neuroma.  Plan: Injected the area today with another disability dehydrated alcohol follow-up with her in 3-4 weeks.

## 2017-03-27 NOTE — Progress Notes (Signed)
03/28/2017 9:27 PM   Meghan Weeks 1971/02/27 [id number]  Referring provider: No referring provider defined for this encounter.  Chief Complaint  Patient presents with  . Results    Renal Ultrasound    HPI: 65 WF with right sided pain, feelings of incomplete emptying and urinary frequency who presents today to review her RUS results.  Background history Patient is a 46 year old Caucasian female who presents today for the complaints of right sided pain, feelings of incomplete emptying and urinary frequency.  She states that she has a remote history of recurrent UTI's.  She was fine for several years.  Then last year, the symptoms returned.  She has had documented UTI in 06/2016 for E. Coli and Klebsiella.  She states she has the sensation that the urine is going up to her right kidney.  This occurs during episodes of urgency.  She has not been diagnosed with reflux.  She states that she was never tested for reflux as a child.  She did state that she had a lot of urinary issues growing up, wetting the bed and recurrent UTI's.  She does not recall having high fevers with her infections or being hospitalized.  She is having frequency, nocturia and intermittency.   Her UA was negative.   Her PVR is 0 mL.  She has sleep apnea and is sleeping with a CPAP machine.  An abdominal ultrasound performed in 2015 noted cortical thinning of the right kidney.   She is drinking 30 ounces of water daily.  She is drinking a large sweet tea, 20 ounces daily.  No sodas.  Coffee daily.   RUS performed on 03/20/2017 noted mildly progressive right renal atrophy and mildly progressive diffusely increased echotexture of the right kidney compatible with chronic medical renal disease. Left renal parapelvic cysts.  No hydronephrosis.  Today, she is experiencing urgency x 0-3, frequency x 8 or more, not restricting fluids to avoid visits to the restroom, not engaging in toilet mapping, incontinence x 0-3 and nocturia  x 0-3.  She is not having dysuria, gross hematuria or suprapubic pain.  She is not having fevers, chills, nausea or vomiting.  Her main complaint is frequency.     She is still experiencing the right sided flank pain.    PMH: Past Medical History:  Diagnosis Date  . Anxiety   . Depression   . History of basal cell cancer   . Neuroma of foot   . Psoriasis     Surgical History: Past Surgical History:  Procedure Laterality Date  . ABDOMINAL HYSTERECTOMY    . BASAL CELL CARCINOMA EXCISION  03/06/2017   On [place] Right   . gastric sleeve surgery  2013   with repair of hiatal hernia  . right elbow surgery    . right wrist surgery      Home Medications:  Allergies as of 03/28/2017      Reactions   Codeine Other (See Comments)   In Cough Syrup- Caused Headache      Medication List        Accurate as of 03/28/17 11:59 PM. Always use your most recent med list.          ibuprofen 200 MG tablet Commonly known as:  ADVIL,MOTRIN Take 600 mg by mouth every 8 (eight) hours as needed.   metroNIDAZOLE 0.75 % gel Commonly known as:  METROGEL Apply topically.   mometasone 0.1 % cream Commonly known  asLynne Leader Apply 1 application topically daily.   tretinoin 0.025 % cream Commonly known as:  RETIN-A   UNABLE TO FIND Take 1 application by mouth as needed. Med Name: Kallie Edward   VENTOLIN HFA 108 (90 Base) MCG/ACT inhaler Generic drug:  albuterol Inhale 2 puffs into the lungs every 6 (six) hours as needed.   VITAMIN B 12 PO Take by mouth.   vitamin C 500 MG tablet Commonly known as:  ASCORBIC ACID Take 500 mg by mouth daily.       Allergies:  Allergies  Allergen Reactions  . Codeine Other (See Comments)    In Cough Syrup- Caused Headache     Family History: Family History  Problem Relation Age of Onset  . Hematuria Mother   . Diabetes Father   . Hypertension Father   . Diabetes Sister   . Hypertension Sister   . Colon  cancer Maternal Grandmother   . Uterine cancer Maternal Grandmother   . Kidney cancer Neg Hx   . Bladder Cancer Neg Hx     Social History:  reports that she quit smoking about 23 years ago. Her smoking use included cigarettes. she has never used smokeless tobacco. She reports that she drinks alcohol. She reports that she does not use drugs.  ROS: UROLOGY Frequent Urination?: Yes Hard to postpone urination?: No Burning/pain with urination?: No Get up at night to urinate?: No Leakage of urine?: No Urine stream starts and stops?: No Trouble starting stream?: No Do you have to strain to urinate?: No Blood in urine?: No Urinary tract infection?: No Sexually transmitted disease?: No Injury to kidneys or bladder?: No Painful intercourse?: No Weak stream?: No Currently pregnant?: No Vaginal bleeding?: No Last menstrual period?: 2006  Gastrointestinal Nausea?: No Vomiting?: No Indigestion/heartburn?: No Diarrhea?: No Constipation?: No  Constitutional Fever: No Night sweats?: No Weight loss?: No Fatigue?: Yes  Skin Skin rash/lesions?: Yes Itching?: No  Eyes Blurred vision?: No Double vision?: No  Ears/Nose/Throat Sore throat?: No Sinus problems?: No  Hematologic/Lymphatic Swollen glands?: No Easy bruising?: Yes  Cardiovascular Leg swelling?: Yes Chest pain?: No  Respiratory Cough?: No Shortness of breath?: No  Endocrine Excessive thirst?: No  Musculoskeletal Back pain?: No Joint pain?: No  Neurological Headaches?: No Dizziness?: No  Psychologic Depression?: Yes Anxiety?: Yes  Physical Exam: BP 111/73   Pulse 67   Temp 98.1 F (36.7 C) (Oral)   Ht 5\' 5"  (1.651 m)   Wt 195 lb 4.8 oz (88.6 kg)   BMI 32.50 kg/m   Constitutional: Well nourished. Alert and oriented, No acute distress. HEENT:  AT, moist mucus membranes. Trachea midline, no masses. Cardiovascular: No clubbing, cyanosis, or edema. Respiratory: Normal respiratory effort, no  increased work of breathing. Skin: No rashes, bruises or suspicious lesions. Lymph: No cervical or inguinal adenopathy. Neurologic: Grossly intact, no focal deficits, moving all 4 extremities. Psychiatric: Normal mood and affect.  Laboratory Data: Cr 1.04 on 03/11/2017  I have reviewed the labs.   Pertinent Imaging: CLINICAL DATA:  Cortical thinning of the right kidney on a right upper quadrant abdomen ultrasound in 2015.  EXAM: RENAL / URINARY TRACT ULTRASOUND COMPLETE  COMPARISON:  Right upper quadrant abdomen ultrasound dated 07/30/2013.  FINDINGS: Right Kidney:  Length: 8.9 cm. Diffusely echogenic with mild diffuse cortical thinning. No hydronephrosis.  Left Kidney:  Length: 11.5 cm. 2.4 cm and 1.7 cm parapelvic cysts. No hydronephrosis. Normal echotexture.  Bladder:  Appears normal for degree of bladder distention.  IMPRESSION: 1.  Mildly progressive right renal atrophy and mildly progressive diffusely increased echotexture of the right kidney compatible with chronic medical renal disease. 2. Left renal parapelvic cysts. 3. No hydronephrosis.   Electronically Signed   By: Claudie Revering M.D.   On: 03/20/2017 17:54  I have independently reviewed the films.    Assessment & Plan:    1. Feelings of incomplete bladder emptying  - PVR is 0 mL and UA is negative - likely due to OAB  2. Right sided pain  - no hydronephrosis seen on RUS  3. Frequency  - patient advised to top consumption of sweet tea and frappes  - will refer to PT as she does not want to pursue medical therapy at this time  4. Cortical thinning of the right kidney  - will obtain a non contrast CT to investigate for a possible post renal cause for cortical thinning   Encouraged the patient to get established with a PCP  Return for follow up after PT.  These notes generated with voice recognition software. I apologize for typographical errors.  Zara Council,  [facility] [address] [place], [place] [address] [phone]

## 2017-03-28 ENCOUNTER — Encounter: Payer: Self-pay | Admitting: Urology

## 2017-03-28 ENCOUNTER — Ambulatory Visit: Payer: BLUE CROSS/BLUE SHIELD | Admitting: Urology

## 2017-03-28 VITALS — BP 111/73 | HR 67 | Temp 98.1°F | Ht 65.0 in | Wt 195.3 lb

## 2017-03-28 DIAGNOSIS — R3914 Feeling of incomplete bladder emptying: Secondary | ICD-10-CM

## 2017-03-28 DIAGNOSIS — R350 Frequency of micturition: Secondary | ICD-10-CM | POA: Diagnosis not present

## 2017-03-28 DIAGNOSIS — N200 Calculus of kidney: Secondary | ICD-10-CM

## 2017-03-28 DIAGNOSIS — R109 Unspecified abdominal pain: Secondary | ICD-10-CM | POA: Diagnosis not present

## 2017-03-28 DIAGNOSIS — R93429 Abnormal radiologic findings on diagnostic imaging of unspecified kidney: Secondary | ICD-10-CM | POA: Diagnosis not present

## 2017-03-28 HISTORY — DX: Calculus of kidney: N20.0

## 2017-04-09 ENCOUNTER — Telehealth: Payer: Self-pay

## 2017-04-09 NOTE — Telephone Encounter (Signed)
Patient given informtion about the nature of pelvic PT evaluation and treatment. Patient stated she would call to schedule an evaluation when she was ready.

## 2017-04-11 ENCOUNTER — Ambulatory Visit (HOSPITAL_COMMUNITY)
Admission: RE | Admit: 2017-04-11 | Discharge: 2017-04-11 | Disposition: A | Discharge status: Home / Self Care | Payer: BLUE CROSS/BLUE SHIELD | Source: Ambulatory Visit | Attending: Urology | Admitting: Urology

## 2017-04-11 ENCOUNTER — Encounter (HOSPITAL_COMMUNITY): Payer: Self-pay

## 2017-04-11 DIAGNOSIS — Z9049 Acquired absence of other specified parts of digestive tract: Secondary | ICD-10-CM | POA: Diagnosis not present

## 2017-04-11 DIAGNOSIS — Z90710 Acquired absence of both cervix and uterus: Secondary | ICD-10-CM | POA: Diagnosis not present

## 2017-04-11 DIAGNOSIS — R109 Unspecified abdominal pain: Secondary | ICD-10-CM | POA: Diagnosis not present

## 2017-04-11 DIAGNOSIS — N200 Calculus of kidney: Secondary | ICD-10-CM | POA: Diagnosis not present

## 2017-04-15 ENCOUNTER — Encounter: Payer: Self-pay | Admitting: Podiatry

## 2017-04-15 ENCOUNTER — Ambulatory Visit: Payer: BLUE CROSS/BLUE SHIELD | Admitting: Podiatry

## 2017-04-15 DIAGNOSIS — D3610 Benign neoplasm of peripheral nerves and autonomic nervous system, unspecified: Secondary | ICD-10-CM

## 2017-04-15 NOTE — Progress Notes (Signed)
She presents today states that the Meghan Weeks neuroma started bother her after she had high wall to get her CT scan. She states the anteromedial aspect of her foot is painful and her lateral aspect of her foot is painful. She states that Asst. Harrington Challenger is never really any better.  Objective: Meghan Weeks neuroma so painful along the tibial sesamoid. She has pain on palpation tibialis anterior and the lateral aspect of the right foot much like a plantar fasciitis.  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 Compensatory pain lateral foot tibialis anterior tendon.  Plan: We discussed her old orthotics today and recommended that she bring them in. States that she's not sure that they're doing any good. I want her to have Meghan Weeks look at them and consider new orthotics. He may also want to consider simply augmenting these orthotics.

## 2017-04-16 ENCOUNTER — Telehealth: Payer: Self-pay

## 2017-04-16 NOTE — Telephone Encounter (Signed)
-----   Message from Nori Riis, PA-C sent at 04/12/2017 10:07 AM EST ----- Please let Mrs. Paradiso know that she has several stones in her right kidney.   She should have an appointment to discuss this further.

## 2017-04-16 NOTE — Telephone Encounter (Signed)
Spoke with pt in reference to stones and needing an appt to discuss management. Pt voiced understanding. Appt made.

## 2017-04-17 ENCOUNTER — Ambulatory Visit (INDEPENDENT_AMBULATORY_CARE_PROVIDER_SITE_OTHER): Payer: BLUE CROSS/BLUE SHIELD | Admitting: Orthotics

## 2017-04-17 DIAGNOSIS — M722 Plantar fascial fibromatosis: Secondary | ICD-10-CM

## 2017-04-17 DIAGNOSIS — M6740 Ganglion, unspecified site: Secondary | ICD-10-CM

## 2017-04-17 DIAGNOSIS — D3610 Benign neoplasm of peripheral nerves and autonomic nervous system, unspecified: Secondary | ICD-10-CM

## 2017-04-17 DIAGNOSIS — M792 Neuralgia and neuritis, unspecified: Secondary | ICD-10-CM

## 2017-04-22 NOTE — Progress Notes (Signed)
Patient presented today for assessment/casting for CMFO.  She has complaint of pain 1st MPJ R and lateral aspect of foot.  She may be compensating for MPJ discomfort by "laying on the lateral aspect" too long in stance phase of gait.    Plan on Foot Orthoic with kinetic wedge under first, and 4* lateral wedge to take pressure off anterior tibalias as well as lateral aspect of forefoot.

## 2017-04-23 NOTE — Progress Notes (Addendum)
04/24/2017 3:35 PM   Meghan Weeks 1970/11/04 [id number]  Referring provider: No referring provider defined for this encounter.  Chief Complaint  Patient presents with  . Feeling of Incomplete Bladder Emptying    HPI: 31 WF with right sided pain, feelings of incomplete emptying and urinary frequency who presents today to review her CT scan results.    Background history Patient is a 46 year old Caucasian female who presents today for the complaints of right sided pain, feelings of incomplete emptying and urinary frequency.  She states that she has a remote history of recurrent UTI's.  She was fine for several years.  Then last year, the symptoms returned.  She has had documented UTI in 06/2016 for E. Coli and Klebsiella.  She states she has the sensation that the urine is going up to her right kidney.  This occurs during episodes of urgency.  She has not been diagnosed with reflux.  She states that she was never tested for reflux as a child.  She did state that she had a lot of urinary issues growing up, wetting the bed and recurrent UTI's.  She does not recall having high fevers with her infections or being hospitalized.  She is having frequency, nocturia and intermittency.   Her UA was negative.   Her PVR is 0 mL.  She has sleep apnea and is sleeping with a CPAP machine.  An abdominal ultrasound performed in 2015 noted cortical thinning of the right kidney.   She is drinking 30 ounces of water daily.  She is drinking a large sweet tea, 20 ounces daily.  No sodas.  Coffee daily.   RUS performed on 03/20/2017 noted mildly progressive right renal atrophy and mildly progressive diffusely increased echotexture of the right kidney compatible with chronic medical renal disease. Left renal parapelvic cysts.  No hydronephrosis.  Non contrast CT performed on 04/11/2017 noted right renal atrophy. Multiple small nonobstructing renal calculi on the right. 2 mm nonobstructing stone in the lower pole  of left kidney. Left kidney normal size. No distinct cause of right-sided abdominal pain is identified.  Today, she is experiencing urgency x 0-3, frequency x 8 or more, not restricting fluids to avoid visits to the restroom, not engaging in toilet mapping, incontinence x 0-3 and nocturia x 0-3.  She is not having dysuria, gross hematuria or suprapubic pain.  She is not having fevers, chills, nausea or vomiting.  Her main complaint is frequency.     She is still experiencing the right sided flank pain.  It is still constant.  It is 5/10 pain.  Nothing seems to help the pain or make it worse.  She has not noticed any change with the pain when eating.     PMH: Past Medical History:  Diagnosis Date  . Anxiety   . Depression   . History of basal cell cancer   . Neuroma of foot   . Psoriasis     Surgical History: Past Surgical History:  Procedure Laterality Date  . ABDOMINAL HYSTERECTOMY    . BASAL CELL CARCINOMA EXCISION  03/06/2017   On [place] Right   . gastric sleeve surgery  2013   with repair of hiatal hernia  . right elbow surgery    . right wrist surgery      Home Medications:  Allergies as of 04/24/2017      Reactions   Codeine Other (See Comments)   In Cough Syrup-  Caused Headache      Medication List        Accurate as of 04/24/17  3:35 PM. Always use your most recent med list.          ibuprofen 200 MG tablet Commonly known as:  ADVIL,MOTRIN Take 600 mg by mouth every 8 (eight) hours as needed.   metroNIDAZOLE 0.75 % gel Commonly known as:  METROGEL Apply 1 application topically as needed.   mometasone 0.1 % cream Commonly known as:  ELOCON Apply 1 application topically as needed.   tretinoin 0.025 % cream Commonly known as:  RETIN-A Apply 1 application topically at bedtime.   UNABLE TO FIND Take 1 application by mouth as needed. Med Name: Kallie Edward   VENTOLIN HFA 108 (90 Base) MCG/ACT inhaler Generic drug:   albuterol Inhale 2 puffs into the lungs every 6 (six) hours as needed.   VITAMIN B 12 PO Take 1 capsule by mouth daily.   vitamin C 500 MG tablet Commonly known as:  ASCORBIC ACID Take 500 mg by mouth daily.       Allergies:  Allergies  Allergen Reactions  . Codeine Other (See Comments)    In Cough Syrup- Caused Headache     Family History: Family History  Problem Relation Age of Onset  . Hematuria Mother   . Diabetes Father   . Hypertension Father   . Diabetes Sister   . Hypertension Sister   . Colon cancer Maternal Grandmother   . Uterine cancer Maternal Grandmother   . Kidney cancer Neg Hx   . Bladder Cancer Neg Hx     Social History:  reports that she quit smoking about 23 years ago. Her smoking use included cigarettes. she has never used smokeless tobacco. She reports that she drinks alcohol. She reports that she does not use drugs.  ROS: UROLOGY Frequent Urination?: Yes Hard to postpone urination?: No Burning/pain with urination?: No Get up at night to urinate?: Yes Leakage of urine?: No Urine stream starts and stops?: No Trouble starting stream?: No Do you have to strain to urinate?: No Blood in urine?: No Urinary tract infection?: No Sexually transmitted disease?: No Injury to kidneys or bladder?: No Painful intercourse?: No Weak stream?: No Currently pregnant?: No Vaginal bleeding?: No Last menstrual period?: 2006  Gastrointestinal Nausea?: No Vomiting?: No Indigestion/heartburn?: No Diarrhea?: No Constipation?: No  Constitutional Fever: No Night sweats?: No Weight loss?: No Fatigue?: No  Skin Skin rash/lesions?: Yes Itching?: No  Eyes Blurred vision?: No Double vision?: No  Ears/Nose/Throat Sore throat?: No Sinus problems?: No  Hematologic/Lymphatic Swollen glands?: No Easy bruising?: Yes  Cardiovascular Leg swelling?: Yes Chest pain?: No  Respiratory Cough?: No Shortness of breath?: No  Endocrine Excessive  thirst?: No  Musculoskeletal Back pain?: No Joint pain?: No  Neurological Headaches?: No Dizziness?: No  Psychologic Depression?: Yes Anxiety?: Yes  Physical Exam: BP (!) 143/80   Pulse 78   Ht 5\' 5"  (1.651 m)   Wt 198 lb 4.8 oz (89.9 kg)   BMI 33.00 kg/m   Constitutional: Well nourished. Alert and oriented, No acute distress. HEENT: [place] AT, moist mucus membranes. Trachea midline, no masses. Cardiovascular: No clubbing, cyanosis, or edema. Respiratory: Normal respiratory effort, no increased work of breathing. Skin: No rashes, bruises or suspicious lesions. Lymph: No cervical or inguinal adenopathy. Neurologic: Grossly intact, no focal deficits, moving all 4 extremities. Psychiatric: Normal mood and affect.  Laboratory Data: Cr 1.04 on 03/11/2017  I have reviewed the labs.  Pertinent Imaging: CLINICAL DATA:  Right-sided flank pain. Abnormal right kidney by ultrasound.  EXAM: CT ABDOMEN AND PELVIS WITHOUT CONTRAST  TECHNIQUE: Multidetector CT imaging of the abdomen and pelvis was performed following the standard protocol without IV contrast.  COMPARISON:  Ultrasound 03/20/2017 and 07/30/2013. CT pelvis 12/23/2006.  FINDINGS: Lower chest: Normal  Hepatobiliary: Previous cholecystectomy. No liver parenchymal abnormality seen.  Pancreas: Normal  Spleen: Normal  Adrenals/Urinary Tract: Adrenal glands are normal. Left kidney shows normal size. 2 mm stone in the lower pole. No evidence of obstruction or passing stone on the left. Right kidney shows generalized atrophy with a length of approximately 8.9 cm. Multiple small nonobstructing calculi. No evidence of passing stone. Bladder is normal.  Stomach/Bowel: Previous gastric reduction surgery. No other bowel finding.  Vascular/Lymphatic: No sign of aortic atherosclerosis. No aneurysm. IVC is normal. No retroperitoneal adenopathy.  Reproductive: Previous hysterectomy.  No pelvic  mass.  Other: No free fluid or air.  Musculoskeletal: Minimal lower lumbar degenerative changes.  IMPRESSION: Right renal atrophy. Multiple small nonobstructing renal calculi on the right. 2 mm nonobstructing stone in the lower pole of left kidney. Left kidney normal size. No distinct cause of right-sided abdominal pain is identified.  Previous gastric reduction surgery. Previous cholecystectomy. Previous hysterectomy.   Electronically Signed   By: Nelson Chimes M.D.   On: 04/12/2017 09:18  I have independently reviewed the films.    Assessment & Plan:    1. Right nephrolithiasis  - stones are non obstructing at this time and kidney is atrophic  - will need to investigate the functionality of the right kidney prior to any definitive treatment for her right sided nephrolithiasis  - will obtain a flow study - will call patient with results  - Advised to contact our office or seek treatment in the ED if becomes febrile or pain/ vomiting are difficult control in order to arrange for emergent/urgent intervention   2. Right sided pain  - no hydronephrosis seen on RUS - no obstructing stones seen on CT  - flow study pending  3. Frequency  - patient advised to stop consumption of sweet tea and frappes  - will refer to PT as she does not want to pursue medical therapy at this time  4. Cortical thinning of the right kidney  - no ureteral stone seen on CT - flow study pending  Encouraged the patient to get established with a PCP  Return for I will call patient with results.  These notes generated with voice recognition software. I apologize for typographical errors.  Zara Council, PA-C  [facility] [address], [address] [place], [place] [address] [phone]  Addendum: CLINICAL DATA:  Acute RIGHT flank pain, history of RIGHT renal atrophy  EXAM: NUCLEAR MEDICINE RENAL SCAN  TECHNIQUE: Radionuclide angiographic and sequential  renal images were obtained after intravenous injection of radiopharmaceutical. Diuretic administration was not performed per order.  RADIOPHARMACEUTICALS:  5 mCi Technetium-90m MAG3 IV  COMPARISON:  CT abdomen and pelvis 04/11/2017  FINDINGS: Blood flow: Normal blood flow to LEFT kidney. Minimally delayed blood flow to very small RIGHT kidney.  LEFT renogram: Normal uptake, concentration and excretion of tracer by LEFT kidney. Normal washout of tracer without significant retention of tracer at the conclusion of the study. Normal time to peak activity of 6.7 minutes with fall to half maximum activity 8.3 minutes later.  RIGHT renogram: Atrophic RIGHT kidney with overall minimally delayed uptake and concentration of tracer. Washout of tracer occurs from  the RIGHT kidney over the course of the exam with no significant abnormal retention of tracer at the conclusion of the study. Mildly delayed time to peak activity at 7.7 minutes with fall to half maximum activity 23.4 minutes later.  Differential renal function:  LEFT kidney:  80%  RIGHT kidney: 20%  IMPRESSION: Atrophic RIGHT kidney with minimally delayed and overall diminished renal function but no evidence of urinary outflow obstruction.  Normal LEFT renogram and washout of tracer  Markedly asymmetric renal function 80% LEFT versus 20% RIGHT.   Electronically Signed   By: Lavonia Dana M.D.   On: 05/01/2017 15:51   Right renal stone(s)  - schedule right ureteroscopy with laser lithotripsy and ureteral stent placement  - explained to the patient how the procedure is performed and the risks involved  - informed patient that they will have a stent placed during the procedure and will remain in place after the procedure for a short time.   - stent may be removed in the office with a cystoscope or patient may be instructed to remove the stent themselves by the string  - described "stent pain" as feelings of  needing to urinate/overactive bladder and a warm, tingling sensation to intense pain in the affected flank  - residual stones within the kidney or ureter may be present after the procedure and may need to have these addressed at a different encounter  - injury to the ureter is the most common intra-operative risk, it may result in an open procedure to correct the defect  - infection and bleeding are also risks  - explained the risks of general anesthesia, such as: MI, CVA, paralysis, coma and/or death.  - advised to contact our office or seek treatment in the ED if becomes febrile or pain/ vomiting are difficult control in order to arrange for emergent/urgent intervention  Patient's questioned were answered and she is ready to proceed with the procedure.

## 2017-04-24 ENCOUNTER — Ambulatory Visit (INDEPENDENT_AMBULATORY_CARE_PROVIDER_SITE_OTHER): Payer: BLUE CROSS/BLUE SHIELD | Admitting: Urology

## 2017-04-24 ENCOUNTER — Encounter: Payer: Self-pay | Admitting: Urology

## 2017-04-24 VITALS — BP 143/80 | HR 78 | Ht 65.0 in | Wt 198.3 lb

## 2017-04-24 DIAGNOSIS — N261 Atrophy of kidney (terminal): Secondary | ICD-10-CM

## 2017-04-24 DIAGNOSIS — R109 Unspecified abdominal pain: Secondary | ICD-10-CM | POA: Diagnosis not present

## 2017-04-24 DIAGNOSIS — R350 Frequency of micturition: Secondary | ICD-10-CM

## 2017-04-24 DIAGNOSIS — N200 Calculus of kidney: Secondary | ICD-10-CM | POA: Diagnosis not present

## 2017-04-29 ENCOUNTER — Ambulatory Visit (INDEPENDENT_AMBULATORY_CARE_PROVIDER_SITE_OTHER): Payer: BLUE CROSS/BLUE SHIELD | Admitting: Podiatry

## 2017-04-29 ENCOUNTER — Ambulatory Visit: Payer: BLUE CROSS/BLUE SHIELD | Admitting: Podiatry

## 2017-04-29 ENCOUNTER — Encounter: Payer: Self-pay | Admitting: Podiatry

## 2017-04-29 DIAGNOSIS — D3610 Benign neoplasm of peripheral nerves and autonomic nervous system, unspecified: Secondary | ICD-10-CM | POA: Diagnosis not present

## 2017-04-29 NOTE — Progress Notes (Signed)
She presents for follow-up of her [place]'s neuroma right foot as well as tibial sesamoiditis. She states that is occasionally better but for the most part is still quite tender. She just had her orthotics reworked by [employer] and they will be returning within the next week or 2.  Objective: Pulses are palpable. She has pain on palpation along the medial aspect of the first metatarsophalangeal joint plantarly including the tibial sesamoid.  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  Plan: I would consider reinjecting today waiting for the orthotics and if they fail to alleviate her symptoms consider surgical intervention. This would consist of excision of the nerve which we discussed today as well as a tibial sesamoid. She has a low risk of hallux valgus.

## 2017-05-01 ENCOUNTER — Encounter (HOSPITAL_COMMUNITY)
Admission: RE | Admit: 2017-05-01 | Discharge: 2017-05-01 | Disposition: A | Discharge status: Home / Self Care | Payer: BLUE CROSS/BLUE SHIELD | Source: Ambulatory Visit | Attending: Urology | Admitting: Urology

## 2017-05-01 DIAGNOSIS — R109 Unspecified abdominal pain: Secondary | ICD-10-CM | POA: Diagnosis not present

## 2017-05-01 MED ORDER — FLUDEOXYGLUCOSE F - 18 (FDG) INJECTION: 5.0000 | Freq: Once | INTRAVENOUS | Status: DC | PRN

## 2017-05-01 MED ORDER — TECHNETIUM TC 99M MERTIATIDE
5.0000 | Freq: Once | INTRAVENOUS | Status: AC | PRN
  Administered 2017-05-01: 5 via INTRAVENOUS

## 2017-05-02 ENCOUNTER — Other Ambulatory Visit: Payer: Self-pay | Admitting: Radiology

## 2017-05-02 DIAGNOSIS — R635 Abnormal weight gain: Secondary | ICD-10-CM | POA: Diagnosis not present

## 2017-05-02 DIAGNOSIS — N200 Calculus of kidney: Secondary | ICD-10-CM

## 2017-05-02 DIAGNOSIS — Z9884 Bariatric surgery status: Secondary | ICD-10-CM | POA: Diagnosis not present

## 2017-05-03 ENCOUNTER — Other Ambulatory Visit
Admission: RE | Admit: 2017-05-03 | Discharge: 2017-05-03 | Disposition: A | Discharge status: Home / Self Care | Payer: BLUE CROSS/BLUE SHIELD | Source: Ambulatory Visit | Attending: Bariatrics | Admitting: Bariatrics

## 2017-05-03 ENCOUNTER — Other Ambulatory Visit: Payer: BLUE CROSS/BLUE SHIELD

## 2017-05-03 ENCOUNTER — Other Ambulatory Visit: Payer: Self-pay | Admitting: Radiology

## 2017-05-03 DIAGNOSIS — N200 Calculus of kidney: Secondary | ICD-10-CM

## 2017-05-03 DIAGNOSIS — Z9884 Bariatric surgery status: Secondary | ICD-10-CM | POA: Insufficient documentation

## 2017-05-03 LAB — MICROSCOPIC EXAMINATION
RBC, UA: NONE SEEN /hpf (ref 0–?)
WBC UA: NONE SEEN /HPF (ref 0–?)

## 2017-05-03 LAB — COMPREHENSIVE METABOLIC PANEL
ALK PHOS: 58 U/L (ref 38–126)
ALT: 15 U/L (ref 14–54)
AST: 17 U/L (ref 15–41)
Albumin: 4.5 g/dL (ref 3.5–5.0)
Anion gap: 10 (ref 5–15)
BILIRUBIN TOTAL: 0.4 mg/dL (ref 0.3–1.2)
BUN: 14 mg/dL (ref 6–20)
CALCIUM: 9 mg/dL (ref 8.9–10.3)
CO2: 27 mmol/L (ref 22–32)
CREATININE: 0.85 mg/dL (ref 0.44–1.00)
Chloride: 101 mmol/L (ref 101–111)
Glucose, Bld: 90 mg/dL (ref 65–99)
Potassium: 3.9 mmol/L (ref 3.5–5.1)
Sodium: 138 mmol/L (ref 135–145)
TOTAL PROTEIN: 7.6 g/dL (ref 6.5–8.1)

## 2017-05-03 LAB — URINALYSIS, COMPLETE
BILIRUBIN UA: NEGATIVE
Glucose, UA: NEGATIVE
Ketones, UA: NEGATIVE
Nitrite, UA: NEGATIVE
PH UA: 6 (ref 5.0–7.5)
Protein, UA: NEGATIVE
Specific Gravity, UA: 1.025 (ref 1.005–1.030)
UUROB: 0.2 mg/dL (ref 0.2–1.0)

## 2017-05-03 LAB — CBC WITH DIFFERENTIAL/PLATELET
BASOS ABS: 0 10*3/uL (ref 0–0.1)
BASOS PCT: 0 %
EOS PCT: 1 %
Eosinophils Absolute: 0 10*3/uL (ref 0–0.7)
HCT: 39.7 % (ref 35.0–47.0)
Hemoglobin: 13.3 g/dL (ref 12.0–16.0)
LYMPHS PCT: 36 %
Lymphs Abs: 1.5 10*3/uL (ref 1.0–3.6)
MCH: 30.7 pg (ref 26.0–34.0)
MCHC: 33.4 g/dL (ref 32.0–36.0)
MCV: 92 fL (ref 80.0–100.0)
MONO ABS: 0.3 10*3/uL (ref 0.2–0.9)
Monocytes Relative: 6 %
Neutro Abs: 2.4 10*3/uL (ref 1.4–6.5)
Neutrophils Relative %: 57 %
PLATELETS: 284 10*3/uL (ref 150–440)
RBC: 4.32 MIL/uL (ref 3.80–5.20)
RDW: 13 % (ref 11.5–14.5)
WBC: 4.2 10*3/uL (ref 3.6–11.0)

## 2017-05-03 LAB — URINE DRUG SCREEN, QUALITATIVE (ARMC ONLY)
AMPHETAMINES, UR SCREEN: NOT DETECTED
BENZODIAZEPINE, UR SCRN: NOT DETECTED
Barbiturates, Ur Screen: NOT DETECTED
Cannabinoid 50 Ng, Ur ~~LOC~~: NOT DETECTED
Cocaine Metabolite,Ur ~~LOC~~: NOT DETECTED
MDMA (Ecstasy)Ur Screen: NOT DETECTED
METHADONE SCREEN, URINE: NOT DETECTED
OPIATE, UR SCREEN: NOT DETECTED
Phencyclidine (PCP) Ur S: NOT DETECTED
Tricyclic, Ur Screen: NOT DETECTED

## 2017-05-03 LAB — LIPID PANEL
CHOLESTEROL: 201 mg/dL — AB (ref 0–200)
HDL: 71 mg/dL (ref >40)
LDL Cholesterol: 117 mg/dL — ABNORMAL HIGH (ref 0–99)
Total CHOL/HDL Ratio: 2.8 RATIO
Triglycerides: 67 mg/dL (ref <150)
VLDL: 13 mg/dL (ref 0–40)

## 2017-05-03 LAB — FERRITIN: Ferritin: 25 ng/mL (ref 11–307)

## 2017-05-03 LAB — TSH: TSH: 0.952 u[IU]/mL (ref 0.350–4.500)

## 2017-05-03 LAB — IRON: Iron: 52 ug/dL (ref 28–170)

## 2017-05-03 LAB — VITAMIN B12: Vitamin B-12: 332 pg/mL (ref 180–914)

## 2017-05-03 LAB — FOLATE: FOLATE: 8.9 ng/mL (ref >5.9)

## 2017-05-03 LAB — MAGNESIUM: Magnesium: 2 mg/dL (ref 1.7–2.4)

## 2017-05-04 LAB — VITAMIN D 25 HYDROXY (VIT D DEFICIENCY, FRACTURES): Vit D, 25-Hydroxy: 35 ng/mL (ref 30.0–100.0)

## 2017-05-04 LAB — ZINC: ZINC: 74 ug/dL (ref 56–134)

## 2017-05-04 LAB — HEMOGLOBIN A1C
Hgb A1c MFr Bld: 5.1 % (ref 4.8–5.6)
MEAN PLASMA GLUCOSE: 99.67 mg/dL

## 2017-05-04 LAB — PARATHYROID HORMONE, INTACT (NO CA): PTH: 54 pg/mL (ref 15–65)

## 2017-05-05 LAB — COPPER, SERUM: COPPER: 103 ug/dL (ref 72–166)

## 2017-05-07 LAB — VITAMIN B1: VITAMIN B1 (THIAMINE): 107 nmol/L (ref 66.5–200.0)

## 2017-05-08 ENCOUNTER — Other Ambulatory Visit: Payer: Self-pay | Admitting: Radiology

## 2017-05-08 ENCOUNTER — Other Ambulatory Visit: Payer: Self-pay

## 2017-05-08 ENCOUNTER — Telehealth: Payer: Self-pay | Admitting: Radiology

## 2017-05-08 ENCOUNTER — Encounter
Admission: RE | Admit: 2017-05-08 | Discharge: 2017-05-08 | Disposition: A | Discharge status: Home / Self Care | Payer: BLUE CROSS/BLUE SHIELD | Source: Ambulatory Visit | Attending: Urology | Admitting: Urology

## 2017-05-08 DIAGNOSIS — R319 Hematuria, unspecified: Principal | ICD-10-CM

## 2017-05-08 DIAGNOSIS — N390 Urinary tract infection, site not specified: Secondary | ICD-10-CM

## 2017-05-08 HISTORY — DX: Other complications of anesthesia, initial encounter: T88.59XA

## 2017-05-08 HISTORY — DX: Sleep apnea, unspecified: G47.30

## 2017-05-08 HISTORY — DX: Bronchitis, not specified as acute or chronic: J40

## 2017-05-08 HISTORY — DX: Adverse effect of unspecified anesthetic, initial encounter: T41.45XA

## 2017-05-08 HISTORY — DX: Calculus of gallbladder without cholecystitis without obstruction: K80.20

## 2017-05-08 HISTORY — DX: Other specified postprocedural states: Z98.890

## 2017-05-08 HISTORY — DX: Calculus of kidney: N20.0

## 2017-05-08 HISTORY — DX: Nausea with vomiting, unspecified: R11.2

## 2017-05-08 LAB — NICOTINE AND METABS, URINE
Cotinine, Urine: NOT DETECTED ng/mL
NICOTINE, URINE: NOT DETECTED ng/mL

## 2017-05-08 LAB — CULTURE, URINE COMPREHENSIVE

## 2017-05-08 MED ORDER — AMOXICILLIN 875 MG PO TABS: 875.0000 mg | ORAL_TABLET | Freq: Two times a day (BID) | ORAL | 0 refills | Status: DC

## 2017-05-08 NOTE — Telephone Encounter (Signed)
Notified pt of +ucx & script sent to pharmacy. Dosing instructions given. Pt voices understanding.

## 2017-05-08 NOTE — Patient Instructions (Signed)
Your procedure is scheduled on: Friday, May 10, 2017 Report to Same Day Surgery on the 2nd floor in the [facility]. To find out your arrival time, please call [phone] between 1PM - 3PM on: Thursday, May 09, 2017  REMEMBER: Instructions that are not followed completely may result in serious medical risk, up to and including death; or upon the discretion of your surgeon and anesthesiologist your surgery may need to be rescheduled.  Do not eat food after midnight the night before your procedure.  No gum chewing or hard candies.  You may however, drink CLEAR liquids up to 2 hours before you are scheduled to arrive at the hospital for your procedure.  Do not drink clear liquids within 2 hours of the start of your surgery.  Clear liquids include: - water  - apple juice without pulp - clear gatorade - black coffee or tea (Do NOT add anything to the coffee or tea) Do NOT drink anything that is not on this list.  No Alcohol for 24 hours before or after surgery.  No Smoking including e-cigarettes for 24 hours prior to surgery. No chewable tobacco products for at least 6 hours prior to surgery. No nicotine patches on the day of surgery.  Notify your doctor if there is any change in your medical condition (cold, fever, infection).  Do not wear jewelry, make-up, hairpins, clips or nail polish.  Do not wear lotions, powders, or perfumes. You may wear deodorant.  Do not shave 48 hours prior to surgery.   Contacts and dentures may not be worn into surgery.  Do not bring valuables to the hospital. [facility] is not responsible for any belongings or valuables.   TAKE THESE MEDICATIONS THE MORNING OF SURGERY WITH A SIP OF WATER:  NON  Bring your C-PAP to the hospital with you in case you may have to spend the night.  Stop Anti-inflammatories such as Advil, Aleve, Ibuprofen, Motrin, Naproxen, Naprosyn, Goodie powder, or aspirin products. (May take Tylenol or Acetaminophen if  needed.)  Stop ANY OVER THE COUNTER supplements (vitamin C) until after surgery. (May continue Vitamin B.)  If you are being discharged the day of surgery, you will not be allowed to drive home. You will need someone to drive you home and stay with you that night.   If you are taking public transportation, you will need to have a responsible adult to with you.  Please call the number above if you have any questions about these instructions.

## 2017-05-09 MED ORDER — CEFAZOLIN SODIUM-DEXTROSE 2-4 GM/100ML-% IV SOLN: 2.0000 g | INTRAVENOUS | Status: DC

## 2017-05-10 ENCOUNTER — Encounter: Payer: Self-pay | Admitting: Registered Nurse

## 2017-05-10 ENCOUNTER — Encounter
Admission: RE | Disposition: A | Discharge status: Home / Self Care | Payer: Self-pay | Source: Ambulatory Visit | Attending: Urology

## 2017-05-10 ENCOUNTER — Ambulatory Visit
Admission: RE | Admit: 2017-05-10 | Discharge: 2017-05-10 | Disposition: A | Discharge status: Home / Self Care | Payer: BLUE CROSS/BLUE SHIELD | Source: Ambulatory Visit | Attending: Urology | Admitting: Urology

## 2017-05-10 DIAGNOSIS — N200 Calculus of kidney: Secondary | ICD-10-CM | POA: Insufficient documentation

## 2017-05-10 DIAGNOSIS — Z539 Procedure and treatment not carried out, unspecified reason: Secondary | ICD-10-CM | POA: Diagnosis not present

## 2017-05-10 DIAGNOSIS — N270 Small kidney, unilateral: Secondary | ICD-10-CM | POA: Diagnosis not present

## 2017-05-10 LAB — VITAMIN K1, SERUM

## 2017-05-10 LAB — VITAMIN E
Vitamin E (Alpha Tocopherol): 10.4 mg/L (ref 7.0–25.1)
Vitamin E(Gamma Tocopherol): 1.6 mg/L (ref 0.5–5.5)

## 2017-05-10 LAB — VITAMIN A: Vitamin A (Retinoic Acid): 43.2 ug/dL (ref 33.1–100.0)

## 2017-05-10 SURGERY — CYSTOSCOPY/URETEROSCOPY/HOLMIUM LASER/STENT PLACEMENT
Anesthesia: Choice | Laterality: Right

## 2017-05-10 MED ORDER — MIDAZOLAM HCL 2 MG/2ML IJ SOLN
INTRAMUSCULAR | Status: AC
  Filled 2017-05-10: qty 2

## 2017-05-10 MED ORDER — FENTANYL CITRATE (PF) 100 MCG/2ML IJ SOLN
INTRAMUSCULAR | Status: AC
  Filled 2017-05-10: qty 2

## 2017-05-10 MED ORDER — PROPOFOL 10 MG/ML IV BOLUS
INTRAVENOUS | Status: AC
  Filled 2017-05-10: qty 20

## 2017-05-10 SURGICAL SUPPLY — 26 items
BAG DRAIN CYSTO-URO LG1000N (MISCELLANEOUS) ×3 IMPLANT
BASKET ZERO TIP 1.9FR (BASKET) IMPLANT
CATH URETL 5X70 OPEN END (CATHETERS) ×3 IMPLANT
CNTNR SPEC 2.5X3XGRAD LEK (MISCELLANEOUS)
CONRAY 43 FOR UROLOGY 50M (MISCELLANEOUS) ×3 IMPLANT
CONT SPEC 4OZ STER OR WHT (MISCELLANEOUS)
CONTAINER SPEC 2.5X3XGRAD LEK (MISCELLANEOUS) IMPLANT
DRAPE UTILITY 15X26 TOWEL STRL (DRAPES) ×3 IMPLANT
FIBER LASER LITHO 273 (Laser) IMPLANT
GLOVE BIOGEL M 8.0 STRL (GLOVE) ×3 IMPLANT
GOWN STANDARD XL  REUSABL (MISCELLANEOUS) ×3 IMPLANT
GUIDEWIRE GREEN .038 145CM (MISCELLANEOUS) IMPLANT
INFUSOR MANOMETER BAG 3000ML (MISCELLANEOUS) ×3 IMPLANT
INTRODUCER DILATOR DOUBLE (INTRODUCER) IMPLANT
KIT RM TURNOVER CYSTO AR (KITS) ×3 IMPLANT
PACK CYSTO AR (MISCELLANEOUS) ×3 IMPLANT
SENSORWIRE 0.038 NOT ANGLED (WIRE) ×3
SET CYSTO W/LG BORE CLAMP LF (SET/KITS/TRAYS/PACK) ×3 IMPLANT
SHEATH URETERAL 12FRX35CM (MISCELLANEOUS) IMPLANT
SHEATH URETERAL 13/15X36 1L (SHEATH) IMPLANT
SHEATH URETL 1L 13/15X28 (SHEATH) IMPLANT
SOL .9 NS 3000ML IRR  AL (IV SOLUTION) ×1
SOL .9 NS 3000ML IRR UROMATIC (IV SOLUTION) ×2 IMPLANT
SURGILUBE 2OZ TUBE FLIPTOP (MISCELLANEOUS) ×3 IMPLANT
WATER STERILE IRR 1000ML POUR (IV SOLUTION) ×3 IMPLANT
WIRE SENSOR 0.038 NOT ANGLED (WIRE) ×2 IMPLANT

## 2017-05-14 ENCOUNTER — Telehealth: Payer: Self-pay | Admitting: Podiatry

## 2017-05-14 ENCOUNTER — Ambulatory Visit: Payer: BLUE CROSS/BLUE SHIELD | Admitting: Orthotics

## 2017-05-14 DIAGNOSIS — M792 Neuralgia and neuritis, unspecified: Secondary | ICD-10-CM

## 2017-05-14 DIAGNOSIS — M6740 Ganglion, unspecified site: Secondary | ICD-10-CM

## 2017-05-14 DIAGNOSIS — G4733 Obstructive sleep apnea (adult) (pediatric): Secondary | ICD-10-CM | POA: Diagnosis not present

## 2017-05-14 DIAGNOSIS — D3610 Benign neoplasm of peripheral nerves and autonomic nervous system, unspecified: Secondary | ICD-10-CM

## 2017-05-14 NOTE — Progress Notes (Signed)
F/ O came back 3/4 instead of full length.

## 2017-05-14 NOTE — Telephone Encounter (Signed)
Have pt in for consult

## 2017-05-16 DIAGNOSIS — K219 Gastro-esophageal reflux disease without esophagitis: Secondary | ICD-10-CM | POA: Diagnosis not present

## 2017-05-16 DIAGNOSIS — Z6832 Body mass index (BMI) 32.0-32.9, adult: Secondary | ICD-10-CM | POA: Diagnosis not present

## 2017-05-16 DIAGNOSIS — Z9884 Bariatric surgery status: Secondary | ICD-10-CM | POA: Diagnosis not present

## 2017-05-16 DIAGNOSIS — Z713 Dietary counseling and surveillance: Secondary | ICD-10-CM | POA: Diagnosis not present

## 2017-05-17 ENCOUNTER — Ambulatory Visit: Payer: BLUE CROSS/BLUE SHIELD

## 2017-05-17 ENCOUNTER — Ambulatory Visit (INDEPENDENT_AMBULATORY_CARE_PROVIDER_SITE_OTHER): Payer: BLUE CROSS/BLUE SHIELD | Admitting: Podiatry

## 2017-05-17 DIAGNOSIS — D3610 Benign neoplasm of peripheral nerves and autonomic nervous system, unspecified: Secondary | ICD-10-CM | POA: Diagnosis not present

## 2017-05-17 NOTE — Progress Notes (Signed)
Patient presents to the office today to review consent forms for surgery scheduled for Dec. 28th. I reviewed pre-op kit with instructions and answered all questions regarding the procedure.

## 2017-05-17 NOTE — Patient Instructions (Signed)
Pre-Operative Instructions  Congratulations, you have decided to take an important step towards improving your quality of life.  You can be assured that the doctors and staff at [facility] will be with you every step of the way.  Here are some important things you should know:  1. Plan to be at the surgery center/hospital at least 1 (one) hour prior to your scheduled time, unless otherwise directed by the surgical center/hospital staff.  You must have a responsible adult accompany you, remain during the surgery and drive you home.  Make sure you have directions to the surgical center/hospital to ensure you arrive on time. 2. If you are having surgery at [facility] or [facility] hospitals, you will need a copy of your medical history and physical form from your family physician within one month prior to the date of surgery. We will give you a form for your primary physician to complete.  3. We make every effort to accommodate the date you request for surgery.  However, there are times where surgery dates or times have to be moved.  We will contact you as soon as possible if a change in schedule is required.   4. No aspirin/ibuprofen for one week before surgery.  If you are on aspirin, any non-steroidal anti-inflammatory medications (Mobic, Aleve, Ibuprofen) should not be taken seven (7) days prior to your surgery.  You make take Tylenol for pain prior to surgery.  5. Medications - If you are taking daily heart and blood pressure medications, seizure, reflux, allergy, asthma, anxiety, pain or diabetes medications, make sure you notify the surgery center/hospital before the day of surgery so they can tell you which medications you should take or avoid the day of surgery. 6. No food or drink after midnight the night before surgery unless directed otherwise by surgical center/hospital staff. 7. No alcoholic beverages 24-hours prior to surgery.  No smoking 24-hours prior or 24-hours after  surgery. 8. Wear loose pants or shorts. They should be loose enough to fit over bandages, boots, and casts. 9. Don't wear slip-on shoes. Sneakers are preferred. 10. Bring your boot with you to the surgery center/hospital.  Also bring crutches or a walker if your physician has prescribed it for you.  If you do not have this equipment, it will be provided for you after surgery. 11. If you have not been contacted by the surgery center/hospital by the day before your surgery, call to confirm the date and time of your surgery. 12. Leave-time from work may vary depending on the type of surgery you have.  Appropriate arrangements should be made prior to surgery with your employer. 13. Prescriptions will be provided immediately following surgery by your doctor.  Fill these as soon as possible after surgery and take the medication as directed. Pain medications will not be refilled on weekends and must be approved by the doctor. 14. Remove nail polish on the operative foot and avoid getting pedicures prior to surgery. 15. Wash the night before surgery.  The night before surgery wash the foot and leg well with water and the antibacterial soap provided. Be sure to pay special attention to beneath the toenails and in between the toes.  Wash for at least three (3) minutes. Rinse thoroughly with water and dry well with a towel.  Perform this wash unless told not to do so by your physician.  Enclosed: 1 Ice pack (please put in freezer the night before surgery)   1 Hibiclens skin cleaner     Pre-op instructions  If you have any questions regarding the instructions, please do not hesitate to call our office.  [place]: [address], [place], [place] [address] -- [phone]  [place]: [address] [place], [place] [address] -- [phone]  [place]: [address]  , [place] [address] -- [phone]  [place]: [address], [address], [place], [place] [address] -- [phone]  Website:  [email or web address] 

## 2017-05-22 DIAGNOSIS — S60222A Contusion of left hand, initial encounter: Secondary | ICD-10-CM | POA: Diagnosis not present

## 2017-05-23 ENCOUNTER — Other Ambulatory Visit: Payer: Self-pay | Admitting: Podiatry

## 2017-05-23 MED ORDER — HYDROMORPHONE HCL 4 MG PO TABS: 4.0000 mg | ORAL_TABLET | ORAL | 0 refills | Status: DC | PRN

## 2017-05-23 MED ORDER — CEPHALEXIN 500 MG PO CAPS: 500.0000 mg | ORAL_CAPSULE | Freq: Three times a day (TID) | ORAL | 0 refills | Status: DC

## 2017-05-23 MED ORDER — PROMETHAZINE HCL 25 MG PO TABS: 25.0000 mg | ORAL_TABLET | Freq: Three times a day (TID) | ORAL | 0 refills | Status: DC | PRN

## 2017-05-24 ENCOUNTER — Encounter: Payer: Self-pay | Admitting: Podiatry

## 2017-05-24 DIAGNOSIS — M84871 Other disorders of continuity of bone, right ankle and foot: Secondary | ICD-10-CM | POA: Diagnosis not present

## 2017-05-24 DIAGNOSIS — G5761 Lesion of plantar nerve, right lower limb: Secondary | ICD-10-CM | POA: Diagnosis not present

## 2017-05-24 DIAGNOSIS — M79674 Pain in right toe(s): Secondary | ICD-10-CM | POA: Diagnosis not present

## 2017-05-24 DIAGNOSIS — M25871 Other specified joint disorders, right ankle and foot: Secondary | ICD-10-CM | POA: Diagnosis not present

## 2017-05-24 DIAGNOSIS — G5781 Other specified mononeuropathies of right lower limb: Secondary | ICD-10-CM | POA: Diagnosis not present

## 2017-05-24 DIAGNOSIS — G4733 Obstructive sleep apnea (adult) (pediatric): Secondary | ICD-10-CM | POA: Diagnosis not present

## 2017-05-29 ENCOUNTER — Ambulatory Visit (INDEPENDENT_AMBULATORY_CARE_PROVIDER_SITE_OTHER): Payer: BLUE CROSS/BLUE SHIELD | Admitting: Podiatry

## 2017-05-29 ENCOUNTER — Encounter: Payer: Self-pay | Admitting: Podiatry

## 2017-05-29 ENCOUNTER — Ambulatory Visit (INDEPENDENT_AMBULATORY_CARE_PROVIDER_SITE_OTHER): Payer: BLUE CROSS/BLUE SHIELD

## 2017-05-29 ENCOUNTER — Encounter: Payer: BLUE CROSS/BLUE SHIELD | Admitting: Orthotics

## 2017-05-29 VITALS — BP 118/83 | HR 79 | Temp 97.9°F | Resp 16

## 2017-05-29 DIAGNOSIS — Z9889 Other specified postprocedural states: Secondary | ICD-10-CM

## 2017-05-29 DIAGNOSIS — M66871 Spontaneous rupture of other tendons, right ankle and foot: Secondary | ICD-10-CM

## 2017-05-29 DIAGNOSIS — D3610 Benign neoplasm of peripheral nerves and autonomic nervous system, unspecified: Secondary | ICD-10-CM

## 2017-05-29 NOTE — Progress Notes (Signed)
She presents today 1 week status post sesamoidectomy and excision of Joplin's neuroma right foot.  She states that she is doing good she has not taken much of the pain medication at all.  She denies fever chills nausea vomiting calf pain chest pain shortness of breath.  Date of surgery May 24, 2017.  Objective: Vital signs are stable alert and oriented x3.  Dry sterile dressing intact was removed demonstrates incision along the plantar medial aspect of the first metatarsophalangeal joint is intact.  Sutures are intact margins appear to be coapting very nicely.  She has good range of motion of the toe without pain.  Assessment: Well-healing surgical toe first metatarsophalangeal joint right.  Plan: She will continue her current therapy plan redressed today dressed a compressive dressing hip is dry clean elevated and iced I will follow-up with her in 1-2 weeks for suture removal.

## 2017-05-30 ENCOUNTER — Encounter: Payer: Self-pay | Admitting: Podiatry

## 2017-06-10 ENCOUNTER — Ambulatory Visit (INDEPENDENT_AMBULATORY_CARE_PROVIDER_SITE_OTHER): Payer: BLUE CROSS/BLUE SHIELD | Admitting: Podiatry

## 2017-06-10 ENCOUNTER — Encounter: Payer: BLUE CROSS/BLUE SHIELD | Admitting: Podiatry

## 2017-06-10 ENCOUNTER — Encounter: Payer: Self-pay | Admitting: Podiatry

## 2017-06-10 DIAGNOSIS — D3610 Benign neoplasm of peripheral nerves and autonomic nervous system, unspecified: Secondary | ICD-10-CM

## 2017-06-10 DIAGNOSIS — Z9889 Other specified postprocedural states: Secondary | ICD-10-CM

## 2017-06-10 NOTE — Progress Notes (Signed)
She presents today date of surgery 05/24/2017 status post tibial sesamoidectomy and excision of a Joplin's neuroma first metatarsophalangeal joint on the right foot.  States that it seems to be doing pretty good but she is getting some zingers to her toes.  Objective: Vital signs are stable alert and oriented x3 no erythema cellulitis drainage or odor I removed every other stitch around the first metatarsophalangeal joint of the right foot.  There is some mild dehiscence so I left the other stitches in.  I placed her in a dry sterile dressing will follow up with her in 1 week.  Assessment: Well-healing surgical foot.  Plan: Redressed follow-up with her in 1 week.

## 2017-06-24 ENCOUNTER — Encounter: Payer: Self-pay | Admitting: Podiatry

## 2017-06-24 ENCOUNTER — Ambulatory Visit (INDEPENDENT_AMBULATORY_CARE_PROVIDER_SITE_OTHER): Payer: BLUE CROSS/BLUE SHIELD | Admitting: Podiatry

## 2017-06-24 DIAGNOSIS — D3610 Benign neoplasm of peripheral nerves and autonomic nervous system, unspecified: Secondary | ICD-10-CM | POA: Diagnosis not present

## 2017-06-24 NOTE — Progress Notes (Signed)
She presents today for follow-up of her sesamoidectomy and Joplin's neuroma.  She states that she still getting some radiating pains from the lateral aspect of the foot.  Denies fever chills nausea vomiting muscle aches and pains.  Objective: Remainder of the sutures were removed today.  No erythema cellulitis drainage or odor mild radiating pain plantar aspect of the forefoot distal to the toe.  No open lesions or wounds.  Pulses remain strong palpable.  Assessment: Some residual neuritis well-healing surgical foot status post excision of Joplin's neuroma as well as tibial sesamoid.  Plan: We will place her in a Darco shoe for the next few days and then progressed to regular tennis shoes I will follow-up with her in a couple weeks.

## 2017-07-15 ENCOUNTER — Ambulatory Visit (INDEPENDENT_AMBULATORY_CARE_PROVIDER_SITE_OTHER): Payer: BLUE CROSS/BLUE SHIELD | Admitting: Podiatry

## 2017-07-15 ENCOUNTER — Encounter: Payer: Self-pay | Admitting: Podiatry

## 2017-07-15 DIAGNOSIS — M2580 Other specified joint disorders, unspecified joint: Secondary | ICD-10-CM

## 2017-07-15 DIAGNOSIS — Z9889 Other specified postprocedural states: Secondary | ICD-10-CM

## 2017-07-15 DIAGNOSIS — D3610 Benign neoplasm of peripheral nerves and autonomic nervous system, unspecified: Secondary | ICD-10-CM

## 2017-07-15 NOTE — Progress Notes (Signed)
She presents today after a tibial sesamoidectomy and excision of Joplin's neuroma date of surgery 05/24/2017.  States that she still gets some swelling in that right lower extremity because of her lymphedema.  She does not use her lymphedema pumps regularly and does not use her compression garments regularly.  She states that after we remove the sutures she had some dehiscence of the wound she cleaned the fibrin from the wound and used Steri-Strips.  They state at this point she does get still gets some numbness in tingling to her hallux.  But it seems to be improving.  Objective: Vital signs are stable alert and oriented x3.  Pulses are palpable.  Neurologic sensorium is intact.  She has great range of motion with much decrease in edema to the first metatarsophalangeal joint of the right foot.  Scar tissue does seem to be attached to deeper tissues possibly even the plantar medial nerve that has been transected resulting in some of her symptoms.  Assessment: Well-healing surgical foot with some neuritis.  Plan: Discussed etiology pathology conservative versus surgical therapies  I will follow-up with her in 6 weeks.

## 2017-07-24 DIAGNOSIS — Z85828 Personal history of other malignant neoplasm of skin: Secondary | ICD-10-CM | POA: Diagnosis not present

## 2017-07-24 DIAGNOSIS — L814 Other melanin hyperpigmentation: Secondary | ICD-10-CM | POA: Diagnosis not present

## 2017-07-24 DIAGNOSIS — L578 Other skin changes due to chronic exposure to nonionizing radiation: Secondary | ICD-10-CM | POA: Diagnosis not present

## 2017-07-24 DIAGNOSIS — L603 Nail dystrophy: Secondary | ICD-10-CM | POA: Diagnosis not present

## 2017-07-24 DIAGNOSIS — L570 Actinic keratosis: Secondary | ICD-10-CM | POA: Diagnosis not present

## 2017-08-26 ENCOUNTER — Encounter: Payer: Self-pay | Admitting: Podiatry

## 2017-08-26 ENCOUNTER — Ambulatory Visit: Payer: BLUE CROSS/BLUE SHIELD | Admitting: Podiatry

## 2017-08-26 DIAGNOSIS — M792 Neuralgia and neuritis, unspecified: Secondary | ICD-10-CM | POA: Diagnosis not present

## 2017-08-27 NOTE — Progress Notes (Signed)
She presents today for follow-up of her surgical foot right.  Date of surgery 05/24/2017.  She is status post excision of Joplin's neuroma first metatarsophalangeal joint.  States that she still has pain and swelling.  Does have a history of lymphedema right lower extremity wears compression hose use his compression pumps.  States that she is been working on scar as far as the massage that is tingling to her great toe.  States the majority of the pain has resolved.  Objective: Vital signs are stable she is alert and oriented x3.  Pulses are palpable.  She still has some tenderness on palpation of the scar site.  The scar is considerably bound down to the deeper tissues more than likely entrapping part of the plantar medial nerve at the level of the joint.  Assessment: Still moderate edema with some discomfort but all in all there is no signs of infection of the wound on the heel.  Plan: Currently waiting on scar tissue breakdown and I think with a little help from her with massage therapy I think it will breakdown much more quickly and alleviate those radiating symptoms that she is currently having.  We also discussed appropriate shoe gear today demonstrating to me several types of her shoes.  I will follow-up with her in about 6 weeks if she is not considerably better.

## 2017-10-07 ENCOUNTER — Encounter: Payer: Self-pay | Admitting: Podiatry

## 2017-10-07 ENCOUNTER — Ambulatory Visit: Payer: BLUE CROSS/BLUE SHIELD | Admitting: Podiatry

## 2017-10-07 DIAGNOSIS — M792 Neuralgia and neuritis, unspecified: Secondary | ICD-10-CM | POA: Diagnosis not present

## 2017-10-07 NOTE — Progress Notes (Signed)
She presents today states the foot seems to be doing much better and less she is been on it for way too long.  She states that she is unable to wear the orthotics because they really did not fit and they did not have much of it cut out the area to offload the first metatarsal phalangeal joint.  Objective: Vital signs are stable alert and oriented x3.  Pulses are palpable.  She has some tenderness on deep palpation of the foot much less sensitive than it used to be.  Particularly around the first metatarsophalangeal joint plantarly she does have some tenderness around the tibial sesamoid.  Assessment: Resolving Baxters neuritis.  Plan: Follow-up with me in about 3 weeks at which time we will reevaluate her orthotics and see by getting her casted for new pair.

## 2017-10-16 DIAGNOSIS — Z9884 Bariatric surgery status: Secondary | ICD-10-CM | POA: Diagnosis not present

## 2017-10-16 DIAGNOSIS — G4733 Obstructive sleep apnea (adult) (pediatric): Secondary | ICD-10-CM | POA: Diagnosis not present

## 2017-11-06 ENCOUNTER — Ambulatory Visit: Payer: BLUE CROSS/BLUE SHIELD | Admitting: Podiatry

## 2017-11-13 ENCOUNTER — Ambulatory Visit: Payer: BLUE CROSS/BLUE SHIELD | Admitting: Podiatry

## 2017-12-04 DIAGNOSIS — Z9884 Bariatric surgery status: Secondary | ICD-10-CM | POA: Diagnosis not present

## 2017-12-04 DIAGNOSIS — Z6835 Body mass index (BMI) 35.0-35.9, adult: Secondary | ICD-10-CM | POA: Diagnosis not present

## 2017-12-04 DIAGNOSIS — G4733 Obstructive sleep apnea (adult) (pediatric): Secondary | ICD-10-CM | POA: Diagnosis not present

## 2017-12-10 DIAGNOSIS — F4323 Adjustment disorder with mixed anxiety and depressed mood: Secondary | ICD-10-CM | POA: Diagnosis not present

## 2017-12-13 ENCOUNTER — Other Ambulatory Visit: Payer: Self-pay | Admitting: Internal Medicine

## 2017-12-16 ENCOUNTER — Ambulatory Visit: Payer: BLUE CROSS/BLUE SHIELD | Admitting: Internal Medicine

## 2017-12-16 VITALS — BP 108/70 | HR 65 | Ht 65.0 in | Wt 215.0 lb

## 2017-12-16 DIAGNOSIS — L409 Psoriasis, unspecified: Secondary | ICD-10-CM | POA: Insufficient documentation

## 2017-12-16 DIAGNOSIS — Z85828 Personal history of other malignant neoplasm of skin: Secondary | ICD-10-CM | POA: Insufficient documentation

## 2017-12-16 DIAGNOSIS — F3341 Major depressive disorder, recurrent, in partial remission: Secondary | ICD-10-CM

## 2017-12-16 DIAGNOSIS — G4730 Sleep apnea, unspecified: Secondary | ICD-10-CM | POA: Insufficient documentation

## 2017-12-16 DIAGNOSIS — F419 Anxiety disorder, unspecified: Secondary | ICD-10-CM

## 2017-12-16 DIAGNOSIS — G4733 Obstructive sleep apnea (adult) (pediatric): Secondary | ICD-10-CM

## 2017-12-16 DIAGNOSIS — D3613 Benign neoplasm of peripheral nerves and autonomic nervous system of lower limb, including hip: Secondary | ICD-10-CM | POA: Insufficient documentation

## 2017-12-16 DIAGNOSIS — N3001 Acute cystitis with hematuria: Secondary | ICD-10-CM | POA: Diagnosis not present

## 2017-12-16 DIAGNOSIS — F329 Major depressive disorder, single episode, unspecified: Secondary | ICD-10-CM | POA: Insufficient documentation

## 2017-12-16 DIAGNOSIS — Z9884 Bariatric surgery status: Secondary | ICD-10-CM | POA: Insufficient documentation

## 2017-12-16 DIAGNOSIS — R1032 Left lower quadrant pain: Secondary | ICD-10-CM

## 2017-12-16 LAB — POCT URINALYSIS DIPSTICK
BILIRUBIN UA: NEGATIVE
GLUCOSE UA: NEGATIVE
KETONES UA: NEGATIVE
Nitrite, UA: NEGATIVE
Protein, UA: NEGATIVE
Spec Grav, UA: 1.015 (ref 1.010–1.025)
Urobilinogen, UA: 0.2 E.U./dL
pH, UA: 6 (ref 5.0–8.0)

## 2017-12-16 MED ORDER — BUPROPION HCL ER (SR) 150 MG PO TB12: 150.0000 mg | ORAL_TABLET | Freq: Two times a day (BID) | ORAL | 3 refills | Status: DC

## 2017-12-16 MED ORDER — CIPROFLOXACIN HCL 250 MG PO TABS: 250.0000 mg | ORAL_TABLET | Freq: Two times a day (BID) | ORAL | 0 refills | Status: AC

## 2017-12-16 NOTE — Progress Notes (Signed)
Date:  12/16/2017   Name:  Meghan Weeks   DOB:  23-Aug-1970   MRN:  [id number]   Chief Complaint: Establish Care; Depression (PHQ9- 11); and Urinary Tract Infection (X 2 weeks. Urgency, and discomfort. )  Depression         This is a recurrent problem.  The onset quality is undetermined.   Associated symptoms include decreased concentration, irritable, decreased interest and sad.  Associated symptoms include no fatigue, does not have insomnia, no headaches and no suicidal ideas.  Treatments tried: zoloft and welbutrin.  Past medical history includes anxiety.   Urinary Tract Infection   This is a new problem. The current episode started in the past 7 days. The problem occurs every urination. The patient is experiencing no pain. There has been no fever. Associated symptoms include frequency and urgency. Pertinent negatives include no chills or hematuria.  Anxiety  Presents for follow-up visit. Symptoms include decreased concentration, excessive worry and nervous/anxious behavior. Patient reports no chest pain, dizziness, insomnia, palpitations, panic, shortness of breath or suicidal ideas. Symptoms occur most days.    OSA - on CPAP nightly for the past 10 years.  She feels that she sleeps well for the most part.  Sometimes gets sleepy during the day.  She has not had a new study in many years.  She has gained weight but is planning to have a duodenal switch in the near future.  LLQ/anterior hip pain - did not have any injury but started after working in the yard.  Mostly painful after sitting when she stands up.   Review of Systems  Constitutional: Negative for chills, fatigue and fever.  Respiratory: Negative for chest tightness and shortness of breath.   Cardiovascular: Negative for chest pain and palpitations.  Gastrointestinal: Positive for abdominal pain.  Genitourinary: Positive for dysuria, frequency and urgency. Negative for hematuria.  Musculoskeletal: Positive for joint  swelling (right ankle). Arthralgias: right ankle.  Neurological: Negative for dizziness and headaches.  Hematological: Negative for adenopathy.  Psychiatric/Behavioral: Positive for decreased concentration and depression. Negative for suicidal ideas. The patient is nervous/anxious. The patient does not have insomnia.     Patient Active Problem List   Diagnosis Date Noted  . Sleep apnea   . Psoriasis   . Neuroma of foot   . History of basal cell cancer   . Depression   . Anxiety   . Chronic pain of right knee 05/18/2016  . History of nonmelanoma skin cancer 07/05/2014  . Symptoms involving digestive system 07/27/2011  . Rectocele 06/25/2011    Prior to Admission medications   Medication Sig Start Date End Date Taking? Authorizing Provider  albuterol (VENTOLIN HFA) 108 (90 Base) MCG/ACT inhaler Inhale 2 puffs into the lungs every 6 (six) hours as needed for wheezing or shortness of breath.  05/17/15  Yes [provider]  Ascorbic Acid (VITAMIN C PO) Take 1 tablet by mouth daily.   Yes [provider]  clobetasol cream (TEMOVATE) 0.05 % PLEASE SEE ATTACHED FOR DETAILED DIRECTIONS 07/24/17  Yes [provider]  Menthol, Topical Analgesic, (ICY HOT EX) Apply 1 application topically 3 (three) times daily as needed (muscle pain).   Yes [provider]  metroNIDAZOLE (METROGEL) 0.75 % gel Apply 1 application topically daily as needed (skin irritation).  01/22/17  Yes [provider]  tretinoin (RETIN-A) 0.025 % cream Apply 1 application topically at bedtime as needed (acne).  10/04/14  Yes [provider]    Allergies  Allergen Reactions  . Codeine Other (See Comments)    In Cough Syrup- Caused Migraines  . Levonorgestrel-Ethinyl Estrad     Past Surgical History:  Procedure Laterality Date  . ABDOMINAL HYSTERECTOMY    . BASAL CELL CARCINOMA EXCISION  03/06/2017   On [place]    . COLONOSCOPY    .  EXCISION MORTON'S NEUROMA Right   . FOOT SURGERY Right    x2  . gastric sleeve surgery  2013   with repair of hiatal hernia  . right elbow surgery    . right wrist surgery      Social History   Tobacco Use  . Smoking status: Former Smoker    Types: Cigarettes    Last attempt to quit: 1995    Years since quitting: 24.5  . Smokeless tobacco: Never Used  Substance Use Topics  . Alcohol use: Yes    Comment: social  . Drug use: No     Medication list has been reviewed and updated.  Current Meds  Medication Sig  . albuterol (VENTOLIN HFA) 108 (90 Base) MCG/ACT inhaler Inhale 2 puffs into the lungs every 6 (six) hours as needed for wheezing or shortness of breath.   . Ascorbic Acid (VITAMIN C PO) Take 1 tablet by mouth daily.  . clobetasol cream (TEMOVATE) 0.05 % PLEASE SEE ATTACHED FOR DETAILED DIRECTIONS  . Menthol, Topical Analgesic, (ICY HOT EX) Apply 1 application topically 3 (three) times daily as needed (muscle pain).  . metroNIDAZOLE (METROGEL) 0.75 % gel Apply 1 application topically daily as needed (skin irritation).   . tretinoin (RETIN-A) 0.025 % cream Apply 1 application topically at bedtime as needed (acne).     PHQ 2/9 Scores 12/16/2017  PHQ - 2 Score 4  PHQ- 9 Score 11    Physical Exam  Constitutional: She is oriented to person, place, and time. She appears well-developed and well-nourished. She is irritable.  Neck: Normal range of motion. Neck supple. Carotid bruit is not present.  Cardiovascular: Normal rate, regular rhythm and normal heart sounds.  Pulmonary/Chest: Effort normal and breath sounds normal. No respiratory distress.  Abdominal: Soft. Bowel sounds are normal. There is tenderness in the suprapubic area. There is no rebound, no guarding and no CVA tenderness.  Neurological: She is alert and oriented to person, place, and time.  Psychiatric: She has a normal mood and affect. Her speech is normal. She expresses no suicidal ideation. She expresses no  suicidal plans.  Nursing note and vitals reviewed.   BP 108/70   Pulse 65   Ht 5\' 5"  (1.651 m)   Wt 215 lb (97.5 kg)   SpO2 98%   BMI 35.78 kg/m   Assessment and Plan: 1. Anxiety Begin bupropion, recheck in one month and then consider adding SSRI - buPROPion (WELLBUTRIN SR) 150 MG 12 hr tablet; Take 1 tablet (150 mg total) by mouth 2 (two) times daily.  Dispense: 60 tablet; Refill: 3  2. Recurrent major depressive disorder, in partial remission ([place]) As above  3. Acute cystitis with hematuria Will treat with three days of cipro - POCT urinalysis dipstick - ciprofloxacin (CIPRO) 250 MG tablet; Take 1 tablet (250 mg total) by mouth 2 (two) times daily for 3 days.  Dispense: 6 tablet; Refill: 0  4. Obstructive sleep apnea syndrome Continue CPAP Consider retesting after bariatric revision and achievement of weight loss goal  5. Abdominal wall pain in left lower quadrant Pt reassured  6. Bariatric surgery status  Planning revision with duodenal switch Permission for surgery planned   Meds ordered this encounter  Medications  . buPROPion (WELLBUTRIN SR) 150 MG 12 hr tablet    Sig: Take 1 tablet (150 mg total) by mouth 2 (two) times daily.    Dispense:  60 tablet    Refill:  3  . ciprofloxacin (CIPRO) 250 MG tablet    Sig: Take 1 tablet (250 mg total) by mouth 2 (two) times daily for 3 days.    Dispense:  6 tablet    Refill:  0    Partially dictated using Editor, commissioning. Any errors are unintentional.  Halina Maidens, MD [facility]  12/16/2017

## 2017-12-20 ENCOUNTER — Other Ambulatory Visit: Payer: Self-pay

## 2017-12-20 ENCOUNTER — Telehealth: Payer: Self-pay

## 2017-12-20 DIAGNOSIS — E669 Obesity, unspecified: Secondary | ICD-10-CM | POA: Diagnosis not present

## 2017-12-20 DIAGNOSIS — Z6834 Body mass index (BMI) 34.0-34.9, adult: Secondary | ICD-10-CM | POA: Diagnosis not present

## 2017-12-20 DIAGNOSIS — Z01818 Encounter for other preprocedural examination: Secondary | ICD-10-CM | POA: Diagnosis not present

## 2017-12-20 DIAGNOSIS — Z9884 Bariatric surgery status: Secondary | ICD-10-CM | POA: Diagnosis not present

## 2017-12-20 DIAGNOSIS — Z713 Dietary counseling and surveillance: Secondary | ICD-10-CM | POA: Diagnosis not present

## 2017-12-20 MED ORDER — BUPROPION HCL ER (XL) 150 MG PO TB24: 150.0000 mg | ORAL_TABLET | Freq: Every day | ORAL | 1 refills | Status: DC

## 2017-12-20 NOTE — Telephone Encounter (Signed)
Patient called stating we started her back on Wellbutrin medication but prescribed the SR tablet. This is causing her to feel like she took a caffeine pill, heart racing, and headache. Looked into past hx and she was talking XL tablet 24hour. Spoke with Dr Army Melia-  Informed patient we will send in XL tablet. If sx do not get better, will need to come in for OV to discuss med change. She verbalized understanding.

## 2017-12-31 DIAGNOSIS — G4733 Obstructive sleep apnea (adult) (pediatric): Secondary | ICD-10-CM | POA: Diagnosis not present

## 2018-01-03 ENCOUNTER — Telehealth: Payer: Self-pay

## 2018-01-03 ENCOUNTER — Other Ambulatory Visit: Payer: Self-pay | Admitting: Internal Medicine

## 2018-01-03 MED ORDER — AMOXICILLIN-POT CLAVULANATE 875-125 MG PO TABS: 1.0000 | ORAL_TABLET | Freq: Two times a day (BID) | ORAL | 0 refills | Status: AC

## 2018-01-03 NOTE — Telephone Encounter (Signed)
Patient called leaving VM stating her UTI still feel like it has not gone away after taking all Cipro antibiotic. She wanted to know if we could send in Augmentin instead since this has worked in the past for her.  Please Advise.  Pharmacy: [facility]

## 2018-01-03 NOTE — Telephone Encounter (Signed)
Her UA was not very impressive last time but I will give her Augmentin.  Let her know that if sx persist, she will need to be seen and have a urine culture done.

## 2018-01-06 NOTE — Telephone Encounter (Signed)
Patient informed and aware will need to schedule office visit with Urine culture if it does not get better.

## 2018-01-08 DIAGNOSIS — Z6836 Body mass index (BMI) 36.0-36.9, adult: Secondary | ICD-10-CM | POA: Diagnosis not present

## 2018-01-08 DIAGNOSIS — G4733 Obstructive sleep apnea (adult) (pediatric): Secondary | ICD-10-CM | POA: Diagnosis not present

## 2018-01-08 DIAGNOSIS — Z9884 Bariatric surgery status: Secondary | ICD-10-CM | POA: Diagnosis not present

## 2018-01-28 ENCOUNTER — Ambulatory Visit: Payer: BLUE CROSS/BLUE SHIELD | Admitting: Internal Medicine

## 2018-01-30 DIAGNOSIS — K219 Gastro-esophageal reflux disease without esophagitis: Secondary | ICD-10-CM | POA: Diagnosis not present

## 2018-01-30 DIAGNOSIS — G4733 Obstructive sleep apnea (adult) (pediatric): Secondary | ICD-10-CM | POA: Diagnosis not present

## 2018-01-30 DIAGNOSIS — Z6836 Body mass index (BMI) 36.0-36.9, adult: Secondary | ICD-10-CM | POA: Diagnosis not present

## 2018-01-30 DIAGNOSIS — K3189 Other diseases of stomach and duodenum: Secondary | ICD-10-CM | POA: Diagnosis not present

## 2018-01-30 DIAGNOSIS — K2970 Gastritis, unspecified, without bleeding: Secondary | ICD-10-CM | POA: Diagnosis not present

## 2018-01-30 DIAGNOSIS — I10 Essential (primary) hypertension: Secondary | ICD-10-CM | POA: Diagnosis not present

## 2018-01-30 DIAGNOSIS — Z87891 Personal history of nicotine dependence: Secondary | ICD-10-CM | POA: Diagnosis not present

## 2018-01-30 DIAGNOSIS — K2950 Unspecified chronic gastritis without bleeding: Secondary | ICD-10-CM | POA: Diagnosis not present

## 2018-01-30 DIAGNOSIS — Z9884 Bariatric surgery status: Secondary | ICD-10-CM | POA: Diagnosis not present

## 2018-01-30 DIAGNOSIS — N189 Chronic kidney disease, unspecified: Secondary | ICD-10-CM | POA: Diagnosis not present

## 2018-02-07 DIAGNOSIS — Z6836 Body mass index (BMI) 36.0-36.9, adult: Secondary | ICD-10-CM | POA: Diagnosis not present

## 2018-02-07 DIAGNOSIS — G4733 Obstructive sleep apnea (adult) (pediatric): Secondary | ICD-10-CM | POA: Diagnosis not present

## 2018-02-07 DIAGNOSIS — Z713 Dietary counseling and surveillance: Secondary | ICD-10-CM | POA: Diagnosis not present

## 2018-02-26 DIAGNOSIS — Z6837 Body mass index (BMI) 37.0-37.9, adult: Secondary | ICD-10-CM | POA: Diagnosis not present

## 2018-02-26 DIAGNOSIS — Z9884 Bariatric surgery status: Secondary | ICD-10-CM | POA: Diagnosis not present

## 2018-02-26 DIAGNOSIS — G4733 Obstructive sleep apnea (adult) (pediatric): Secondary | ICD-10-CM | POA: Diagnosis not present

## 2018-03-06 DIAGNOSIS — Z01818 Encounter for other preprocedural examination: Secondary | ICD-10-CM | POA: Diagnosis not present

## 2018-03-13 DIAGNOSIS — Z85828 Personal history of other malignant neoplasm of skin: Secondary | ICD-10-CM | POA: Diagnosis not present

## 2018-03-13 DIAGNOSIS — G4733 Obstructive sleep apnea (adult) (pediatric): Secondary | ICD-10-CM | POA: Diagnosis not present

## 2018-03-13 DIAGNOSIS — F329 Major depressive disorder, single episode, unspecified: Secondary | ICD-10-CM | POA: Diagnosis not present

## 2018-03-13 DIAGNOSIS — Z9049 Acquired absence of other specified parts of digestive tract: Secondary | ICD-10-CM | POA: Diagnosis not present

## 2018-03-13 DIAGNOSIS — Z79899 Other long term (current) drug therapy: Secondary | ICD-10-CM | POA: Diagnosis not present

## 2018-03-13 DIAGNOSIS — Z6837 Body mass index (BMI) 37.0-37.9, adult: Secondary | ICD-10-CM | POA: Diagnosis not present

## 2018-03-13 DIAGNOSIS — Z6836 Body mass index (BMI) 36.0-36.9, adult: Secondary | ICD-10-CM | POA: Diagnosis not present

## 2018-03-13 DIAGNOSIS — Z90710 Acquired absence of both cervix and uterus: Secondary | ICD-10-CM | POA: Diagnosis not present

## 2018-03-13 DIAGNOSIS — Z9889 Other specified postprocedural states: Secondary | ICD-10-CM | POA: Diagnosis not present

## 2018-03-13 DIAGNOSIS — Z9884 Bariatric surgery status: Secondary | ICD-10-CM | POA: Diagnosis not present

## 2018-03-13 DIAGNOSIS — Z9851 Tubal ligation status: Secondary | ICD-10-CM | POA: Diagnosis not present

## 2018-03-13 DIAGNOSIS — Z87442 Personal history of urinary calculi: Secondary | ICD-10-CM | POA: Diagnosis not present

## 2018-03-13 DIAGNOSIS — Z87891 Personal history of nicotine dependence: Secondary | ICD-10-CM | POA: Diagnosis not present

## 2018-03-13 DIAGNOSIS — Z903 Acquired absence of stomach [part of]: Secondary | ICD-10-CM | POA: Diagnosis not present

## 2018-03-13 DIAGNOSIS — K2970 Gastritis, unspecified, without bleeding: Secondary | ICD-10-CM | POA: Diagnosis not present

## 2018-03-13 DIAGNOSIS — Z87440 Personal history of urinary (tract) infections: Secondary | ICD-10-CM | POA: Diagnosis not present

## 2018-03-13 HISTORY — PX: OTHER SURGICAL HISTORY: SHX169

## 2018-03-14 DIAGNOSIS — F329 Major depressive disorder, single episode, unspecified: Secondary | ICD-10-CM | POA: Diagnosis not present

## 2018-03-14 DIAGNOSIS — G4733 Obstructive sleep apnea (adult) (pediatric): Secondary | ICD-10-CM | POA: Diagnosis not present

## 2018-03-14 DIAGNOSIS — K2970 Gastritis, unspecified, without bleeding: Secondary | ICD-10-CM | POA: Diagnosis not present

## 2018-03-14 MED ORDER — DIPHENHYDRAMINE HCL 50 MG/ML IJ SOLN
25.00 | INTRAMUSCULAR | Status: DC
Start: ? — End: 2018-03-14

## 2018-03-14 MED ORDER — SODIUM CHLORIDE 0.9 % IV SOLN
150.00 | INTRAVENOUS | Status: DC
Start: ? — End: 2018-03-14

## 2018-03-14 MED ORDER — HYDRALAZINE HCL 20 MG/ML IJ SOLN
10.00 | INTRAMUSCULAR | Status: DC
Start: ? — End: 2018-03-14

## 2018-03-14 MED ORDER — HYDROMORPHONE HCL 1 MG/ML IJ SOLN
1.00 | INTRAMUSCULAR | Status: DC
Start: ? — End: 2018-03-14

## 2018-03-14 MED ORDER — ENOXAPARIN SODIUM 40 MG/0.4ML ~~LOC~~ SOLN
40.00 | SUBCUTANEOUS | Status: DC
Start: 2018-03-15 — End: 2018-03-14

## 2018-03-14 MED ORDER — PANTOPRAZOLE SODIUM 40 MG IV SOLR
40.00 | INTRAVENOUS | Status: DC
Start: 2018-03-15 — End: 2018-03-14

## 2018-03-14 MED ORDER — ONDANSETRON HCL 4 MG/2ML IJ SOLN
4.00 | INTRAMUSCULAR | Status: DC
Start: ? — End: 2018-03-14

## 2018-03-14 MED ORDER — OXYCODONE HCL 5 MG/5ML PO SOLN
5.00 | ORAL | Status: DC
Start: ? — End: 2018-03-14

## 2018-03-14 MED ORDER — GENERIC EXTERNAL MEDICATION
12.50 | Status: DC
Start: ? — End: 2018-03-14

## 2018-03-14 MED ORDER — ALBUTEROL SULFATE (2.5 MG/3ML) 0.083% IN NEBU
2.50 | INHALATION_SOLUTION | RESPIRATORY_TRACT | Status: DC
Start: ? — End: 2018-03-14

## 2018-03-25 DIAGNOSIS — Z9884 Bariatric surgery status: Secondary | ICD-10-CM | POA: Diagnosis not present

## 2018-03-25 DIAGNOSIS — Z713 Dietary counseling and surveillance: Secondary | ICD-10-CM | POA: Diagnosis not present

## 2018-04-05 ENCOUNTER — Other Ambulatory Visit: Payer: Self-pay | Admitting: Internal Medicine

## 2018-04-06 ENCOUNTER — Other Ambulatory Visit: Payer: Self-pay | Admitting: Internal Medicine

## 2018-04-06 ENCOUNTER — Encounter: Payer: Self-pay | Admitting: Internal Medicine

## 2018-04-10 ENCOUNTER — Other Ambulatory Visit: Payer: Self-pay | Admitting: Internal Medicine

## 2018-04-10 ENCOUNTER — Encounter: Payer: Self-pay | Admitting: Internal Medicine

## 2018-04-10 ENCOUNTER — Ambulatory Visit: Payer: BLUE CROSS/BLUE SHIELD | Admitting: Internal Medicine

## 2018-04-10 VITALS — BP 118/64 | HR 67 | Ht 65.0 in | Wt 207.0 lb

## 2018-04-10 DIAGNOSIS — F3341 Major depressive disorder, recurrent, in partial remission: Secondary | ICD-10-CM | POA: Diagnosis not present

## 2018-04-10 DIAGNOSIS — R300 Dysuria: Secondary | ICD-10-CM | POA: Diagnosis not present

## 2018-04-10 LAB — POCT URINALYSIS DIPSTICK
Bilirubin, UA: NEGATIVE
Blood, UA: NEGATIVE
Glucose, UA: NEGATIVE
Leukocytes, UA: NEGATIVE
NITRITE UA: NEGATIVE
PH UA: 6 (ref 5.0–8.0)
Protein, UA: NEGATIVE
Spec Grav, UA: 1.015 (ref 1.010–1.025)
UROBILINOGEN UA: 0.2 U/dL

## 2018-04-10 LAB — POCT WET PREP WITH KOH
Clue Cells Wet Prep HPF POC: NEGATIVE
KOH PREP POC: NEGATIVE
Trichomonas, UA: NEGATIVE

## 2018-04-10 MED ORDER — AMOXICILLIN-POT CLAVULANATE 600-42.9 MG/5ML PO SUSR: 600.0000 mg | Freq: Two times a day (BID) | ORAL | 0 refills | Status: AC

## 2018-04-10 MED ORDER — BUPROPION HCL ER (XL) 150 MG PO TB24: 150.0000 mg | ORAL_TABLET | Freq: Every day | ORAL | 5 refills | Status: DC

## 2018-04-10 NOTE — Progress Notes (Signed)
Date:  04/10/2018   Name:  Meghan Weeks   DOB:  08-23-1970   MRN:  [id number]   Chief Complaint: Depression (Refill on bupropion. PHQ9- 1) and Vaginal Pain (Pain started getting worse yesterday- pain and pressure in vagina. )  Depression         This is a chronic problem.  The problem occurs rarely.  The problem has been resolved since onset.  Associated symptoms include no fatigue and no suicidal ideas.  Past treatments include other medications.  Compliance with treatment is good.  Previous treatment provided significant relief. Vaginal Pain  Associated symptoms include abdominal pain and dysuria. Pertinent negatives include no chills, constipation, diarrhea, fever, hematuria or nausea.  Dysuria   This is a new problem. The current episode started in the past 7 days. The problem occurs every urination. The problem has been unchanged. The quality of the pain is described as burning. The pain is mild. Pertinent negatives include no chills, hematuria or nausea.    Review of Systems  Constitutional: Negative for chills, fatigue and fever.  Respiratory: Negative for chest tightness, shortness of breath and wheezing.   Cardiovascular: Negative for chest pain and palpitations.  Gastrointestinal: Positive for abdominal pain. Negative for constipation, diarrhea and nausea.  Genitourinary: Positive for dysuria. Negative for hematuria, vaginal bleeding and vaginal pain.  Hematological: Negative for adenopathy.  Psychiatric/Behavioral: Positive for depression. Negative for dysphoric mood, sleep disturbance and suicidal ideas. The patient is not nervous/anxious.     Patient Active Problem List   Diagnosis Date Noted  . Bariatric surgery status 12/16/2017  . Abdominal wall pain in left lower quadrant 12/16/2017  . Sleep apnea   . Psoriasis   . Neuroma of foot   . History of basal cell cancer   . Depression   . Anxiety   . Chronic pain of right knee 05/18/2016  . History of nonmelanoma  skin cancer 07/05/2014  . Symptoms involving digestive system 07/27/2011  . Rectocele 06/25/2011    Allergies  Allergen Reactions  . Codeine Other (See Comments)    In Cough Syrup- Caused Migraines  . Levonorgestrel-Ethinyl Estrad     Past Surgical History:  Procedure Laterality Date  . ABDOMINAL HYSTERECTOMY    . BASAL CELL CARCINOMA EXCISION  03/06/2017   On [place]    . COLONOSCOPY    . EXCISION MORTON'S NEUROMA Right   . FOOT SURGERY Right    x2  . gastric sleeve surgery  2013   with repair of hiatal hernia  . REVISION OF GASTRIC SLEEVE TO BILIOPANCREATIC DIVERSION WITH DUODENAL SWITCH LAPAROSCOPIC  03/13/2018  . right elbow surgery    . right wrist surgery      Social History   Tobacco Use  . Smoking status: Former Smoker    Types: Cigarettes    Last attempt to quit: 1995    Years since quitting: 24.8  . Smokeless tobacco: Never Used  Substance Use Topics  . Alcohol use: Yes    Comment: social  . Drug use: No     Medication list has been reviewed and updated.  Current Meds  Medication Sig  . albuterol (VENTOLIN HFA) 108 (90 Base) MCG/ACT inhaler Inhale 2 puffs into the lungs every 6 (six) hours as needed for wheezing or shortness of breath.   . Ascorbic Acid (VITAMIN C PO) Take 1 tablet by mouth daily.  Marland Kitchen buPROPion (WELLBUTRIN XL) 150 MG 24 hr tablet Take 1 tablet (  150 mg total) by mouth daily.  . clobetasol cream (TEMOVATE) 0.05 % PLEASE SEE ATTACHED FOR DETAILED DIRECTIONS  . Menthol, Topical Analgesic, (ICY HOT EX) Apply 1 application topically 3 (three) times daily as needed (muscle pain).  . metroNIDAZOLE (METROGEL) 0.75 % gel Apply 1 application topically daily as needed (skin irritation).   . NON FORMULARY CPAP @@ 10 cm H2O  . tretinoin (RETIN-A) 0.025 % cream Apply 1 application topically at bedtime as needed (acne).     PHQ 2/9 Scores 04/10/2018 12/16/2017  PHQ - 2 Score 0 4  PHQ- 9 Score 1 11    Physical Exam    Constitutional: She is oriented to person, place, and time. She appears well-developed. No distress.  HENT:  Head: Normocephalic and atraumatic.  Pulmonary/Chest: Effort normal. No respiratory distress.  Abdominal: Soft. Bowel sounds are normal.  Genitourinary: There is tenderness on the right labia. There is no lesion or injury on the right labia. There is tenderness on the left labia. There is no lesion or injury on the left labia.  Musculoskeletal: Normal range of motion.  Neurological: She is alert and oriented to person, place, and time.  Skin: Skin is warm and dry. No rash noted.  Psychiatric: She has a normal mood and affect. Her behavior is normal. Thought content normal.  Nursing note and vitals reviewed.   BP 118/64 (BP Location: Right Arm, Patient Position: Sitting, Cuff Size: Normal)   Pulse 67   Ht 5\' 5"  (1.651 m)   Wt 207 lb (93.9 kg)   SpO2 98%   BMI 34.45 kg/m   Assessment and Plan: 1. Recurrent major depressive disorder, in partial remission ([place]) Doing well on current therapy - buPROPion (WELLBUTRIN XL) 150 MG 24 hr tablet; Take 1 tablet (150 mg total) by mouth daily.  Dispense: 30 tablet; Refill: 5  2. Dysuria UA unremarkable and Wet prep with minimal findings Will treat empirically for UTI and await culture May need to see Urology - Urine Culture - POCT Wet Prep with KOH - amoxicillin-clavulanate (AUGMENTIN) 600-42.9 MG/5ML suspension; Take 5 mLs (600 mg total) by mouth 2 (two) times daily for 7 days.  Dispense: 75 mL; Refill: 0   Partially dictated using Editor, commissioning. Any errors are unintentional.  Halina Maidens, MD [facility]  04/10/2018

## 2018-04-12 LAB — URINE CULTURE

## 2018-04-15 NOTE — Progress Notes (Signed)
Patient informed of neg UC. Told to call back if sx do not improve and we will have to place referral.

## 2018-05-12 DIAGNOSIS — Z85828 Personal history of other malignant neoplasm of skin: Secondary | ICD-10-CM | POA: Diagnosis not present

## 2018-05-12 DIAGNOSIS — L259 Unspecified contact dermatitis, unspecified cause: Secondary | ICD-10-CM | POA: Diagnosis not present

## 2018-05-27 DIAGNOSIS — G4733 Obstructive sleep apnea (adult) (pediatric): Secondary | ICD-10-CM | POA: Diagnosis not present

## 2018-06-09 ENCOUNTER — Other Ambulatory Visit: Payer: Self-pay | Admitting: Internal Medicine

## 2018-06-09 DIAGNOSIS — F3341 Major depressive disorder, recurrent, in partial remission: Secondary | ICD-10-CM

## 2018-07-16 DIAGNOSIS — L409 Psoriasis, unspecified: Secondary | ICD-10-CM | POA: Diagnosis not present

## 2018-07-21 ENCOUNTER — Telehealth: Payer: Self-pay

## 2018-07-21 NOTE — Telephone Encounter (Signed)
Pt pharmacy called saying that they made two errors when filling the patients medication. Said the pt is supposed to be taking Wellbutrin 150 mg Daily and they filled 300 mg instead. The patient has been taking this for the last 30 days. Also, the patient was supposed to receive 90 days when filling the medication and she only received 30. Said they refill the medication for 60 more days but gave her the correct Rx dosage (150 mg daily.)  Called and spoke with patient. She said she has not had any medication side effects with the change. Told her if she does experience any side effects to give [place] a call or go see ER.  She verbalized understanding of this.

## 2018-08-25 DIAGNOSIS — G4733 Obstructive sleep apnea (adult) (pediatric): Secondary | ICD-10-CM | POA: Diagnosis not present

## 2018-09-10 DIAGNOSIS — Z9884 Bariatric surgery status: Secondary | ICD-10-CM | POA: Diagnosis not present

## 2018-09-10 DIAGNOSIS — Z6829 Body mass index (BMI) 29.0-29.9, adult: Secondary | ICD-10-CM | POA: Diagnosis not present

## 2018-09-16 ENCOUNTER — Other Ambulatory Visit: Payer: Self-pay | Admitting: Internal Medicine

## 2018-09-16 DIAGNOSIS — F3341 Major depressive disorder, recurrent, in partial remission: Secondary | ICD-10-CM

## 2018-09-19 ENCOUNTER — Encounter: Payer: Self-pay | Admitting: Internal Medicine

## 2018-09-19 ENCOUNTER — Other Ambulatory Visit: Payer: Self-pay

## 2018-09-19 ENCOUNTER — Ambulatory Visit (INDEPENDENT_AMBULATORY_CARE_PROVIDER_SITE_OTHER): Payer: BLUE CROSS/BLUE SHIELD | Admitting: Internal Medicine

## 2018-09-19 VITALS — Ht 65.0 in | Wt 207.0 lb

## 2018-09-19 DIAGNOSIS — J3089 Other allergic rhinitis: Secondary | ICD-10-CM | POA: Diagnosis not present

## 2018-09-19 DIAGNOSIS — J9801 Acute bronchospasm: Secondary | ICD-10-CM | POA: Diagnosis not present

## 2018-09-19 MED ORDER — ALBUTEROL SULFATE HFA 108 (90 BASE) MCG/ACT IN AERS: 2.0000 | INHALATION_SPRAY | Freq: Four times a day (QID) | RESPIRATORY_TRACT | 1 refills | Status: DC | PRN

## 2018-09-19 NOTE — Progress Notes (Signed)
Date:  09/19/2018   Name:  Meghan Weeks   DOB:  Feb 18, 1971   MRN:  [id number]  I connected with this patient, Meghan Weeks, by telephone at the patient's work.  I verified that I am speaking with the correct person using two identifiers. This visit was conducted via telephone due to the Covid-19 outbreak from my office at [facility] in [place], [place]. I discussed the limitations, risks, security and privacy concerns of performing an evaluation and management service by telephone. I also discussed with the patient that there may be a patient responsible charge related to this service. The patient expressed understanding and agreed to proceed.Marland Kitchenec Chief Complaint: Allergies (Says has used inhaler in the past - it is in her records but Dr. Army Melia has never prescribed. Uses for allergy season due to pollen causing difficulty breathing. Not a smoker. )  Allergies - she has chronic allergies and often gets sinus congestion as well as chest tightness.  She uses sinus spray regularly for trouble breathing through her nose. She has used an albuterol inhaler in the past for chest tightness often triggered by environmental allergies.  She has never had asthma as a child or been diagnosed with asthma recently. She does not have any wheezing or shortness of breath.  She is an ex-smoker - quit 24 years ago.  CXR done in 2012 was normal. She has also recently been riding to work with a smoker who also uses strong air freshener. Her current inhaler is Pro-air HFA, expired 02/26/2017.  She would like to have a prescription for a new one.   Review of Systems  Constitutional: Negative for chills, fatigue and fever.  HENT: Negative for congestion, sore throat and trouble swallowing.   Respiratory: Positive for chest tightness. Negative for cough, choking, shortness of breath and wheezing.        No sputum production  Cardiovascular: Negative for chest pain and palpitations.  Allergic/Immunologic:  Positive for environmental allergies.  Neurological: Negative for dizziness and headaches.  Psychiatric/Behavioral: Negative for sleep disturbance.    Patient Active Problem List   Diagnosis Date Noted  . Bariatric surgery status 12/16/2017  . Abdominal wall pain in left lower quadrant 12/16/2017  . Sleep apnea   . Psoriasis   . Neuroma of foot   . History of basal cell cancer   . Depression   . Anxiety   . Chronic pain of right knee 05/18/2016  . History of nonmelanoma skin cancer 07/05/2014  . Symptoms involving digestive system 07/27/2011  . Rectocele 06/25/2011    Allergies  Allergen Reactions  . Codeine Other (See Comments)    In Cough Syrup- Caused Migraines  . Levonorgestrel-Ethinyl Estrad     Past Surgical History:  Procedure Laterality Date  . ABDOMINAL HYSTERECTOMY    . BASAL CELL CARCINOMA EXCISION  03/06/2017   On [place]    . COLONOSCOPY    . EXCISION MORTON'S NEUROMA Right   . FOOT SURGERY Right    x2  . gastric sleeve surgery  2013   with repair of hiatal hernia  . REVISION OF GASTRIC SLEEVE TO BILIOPANCREATIC DIVERSION WITH DUODENAL SWITCH LAPAROSCOPIC  03/13/2018  . right elbow surgery    . right wrist surgery      Social History   Tobacco Use  . Smoking status: Former Smoker    Packs/day: 0.50    Years: 10.00    Pack years: 5.00    Types: Cigarettes  Last attempt to quit: 1995    Years since quitting: 25.3  . Smokeless tobacco: Never Used  Substance Use Topics  . Alcohol use: Yes    Comment: social  . Drug use: No     Medication list has been reviewed and updated.  Current Meds  Medication Sig  . albuterol (VENTOLIN HFA) 108 (90 Base) MCG/ACT inhaler Inhale 2 puffs into the lungs every 6 (six) hours as needed for wheezing or shortness of breath.   Marland Kitchen buPROPion (WELLBUTRIN XL) 150 MG 24 hr tablet TAKE 1 TABLET BY MOUTH EVERY DAY  . calcium carbonate (TITRALAC) 420 MG CHEW chewable tablet Chew 500 mg  by mouth. 4 capsules daily  . clobetasol cream (TEMOVATE) 0.05 % PLEASE SEE ATTACHED FOR DETAILED DIRECTIONS  . Menthol, Topical Analgesic, (ICY HOT EX) Apply 1 application topically 3 (three) times daily as needed (muscle pain).  . metroNIDAZOLE (METROGEL) 0.75 % gel Apply 1 application topically daily as needed (skin irritation).   . Multiple Vitamin (MULTI VITAMIN DAILY PO) Take by mouth.  . NON FORMULARY CPAP @@ 10 cm H2O  . tretinoin (RETIN-A) 0.025 % cream Apply 1 application topically at bedtime as needed (acne).     PHQ 2/9 Scores 09/19/2018 04/10/2018 12/16/2017  PHQ - 2 Score 0 0 4  PHQ- 9 Score 0 1 11    BP Readings from Last 3 Encounters:  04/10/18 118/64  12/16/17 108/70  05/29/17 118/83    Physical Exam Pulmonary:     Comments: Normal voice and respiratory effort during the exam.  No cough or wheezing noted during the call. Neurological:     Mental Status: She is alert.  Psychiatric:        Attention and Perception: Attention normal.        Mood and Affect: Mood normal.        Speech: Speech normal.        Cognition and Memory: Cognition normal.     Wt Readings from Last 3 Encounters:  09/19/18 207 lb (93.9 kg)  04/10/18 207 lb (93.9 kg)  12/16/17 215 lb (97.5 kg)    Ht 5\' 5"  (1.651 m)   Wt 207 lb (93.9 kg)   BMI 34.45 kg/m   Assessment and Plan: 1. Environmental and seasonal allergies Continue sinus spray Use albuterol PRN - albuterol (PROAIR HFA) 108 (90 Base) MCG/ACT inhaler; Inhale 2 puffs into the lungs every 6 (six) hours as needed for wheezing or shortness of breath.  Dispense: 18 g; Refill: 1  2. Bronchospasm Patient is advised that if the use of albuterol increases, may need evaluation for adult onset asthma - albuterol (PROAIR HFA) 108 (90 Base) MCG/ACT inhaler; Inhale 2 puffs into the lungs every 6 (six) hours as needed for wheezing or shortness of breath.  Dispense: 18 g; Refill: 1  I spent 8 minutes on the encounter. Partially dictated  using Editor, commissioning. Any errors are unintentional.  Halina Maidens, MD [facility]  09/19/2018

## 2019-01-21 ENCOUNTER — Encounter: Payer: Self-pay | Admitting: Internal Medicine

## 2019-01-21 ENCOUNTER — Ambulatory Visit (INDEPENDENT_AMBULATORY_CARE_PROVIDER_SITE_OTHER): Payer: BC Managed Care – PPO | Admitting: Internal Medicine

## 2019-01-21 ENCOUNTER — Other Ambulatory Visit: Payer: Self-pay | Admitting: Internal Medicine

## 2019-01-21 ENCOUNTER — Other Ambulatory Visit: Payer: Self-pay

## 2019-01-21 VITALS — BP 114/78 | HR 69 | Ht 65.0 in | Wt 171.0 lb

## 2019-01-21 DIAGNOSIS — F3341 Major depressive disorder, recurrent, in partial remission: Secondary | ICD-10-CM | POA: Insufficient documentation

## 2019-01-21 DIAGNOSIS — Z6829 Body mass index (BMI) 29.0-29.9, adult: Secondary | ICD-10-CM | POA: Diagnosis not present

## 2019-01-21 DIAGNOSIS — Z1231 Encounter for screening mammogram for malignant neoplasm of breast: Secondary | ICD-10-CM

## 2019-01-21 DIAGNOSIS — Z0000 Encounter for general adult medical examination without abnormal findings: Secondary | ICD-10-CM

## 2019-01-21 DIAGNOSIS — Z9884 Bariatric surgery status: Secondary | ICD-10-CM | POA: Diagnosis not present

## 2019-01-21 LAB — POCT URINALYSIS DIPSTICK
Bilirubin, UA: NEGATIVE
Blood, UA: NEGATIVE
Glucose, UA: NEGATIVE
Ketones, UA: NEGATIVE
Leukocytes, UA: NEGATIVE
Nitrite, UA: NEGATIVE
Protein, UA: NEGATIVE
Spec Grav, UA: 1.015 (ref 1.010–1.025)
Urobilinogen, UA: 0.2 E.U./dL
pH, UA: 6 (ref 5.0–8.0)

## 2019-01-21 MED ORDER — BUPROPION HCL ER (XL) 150 MG PO TB24: 150.0000 mg | ORAL_TABLET | Freq: Two times a day (BID) | ORAL | 1 refills | Status: DC

## 2019-01-21 NOTE — Progress Notes (Signed)
Date:  01/21/2019   Name:  Meghan Weeks   DOB:  03-Aug-1970   MRN:  [id number]   Chief Complaint: Annual Exam (Breast Exam. Last mammo was several years ago at [facility]. ) and Anxiety ([place], [place]) Meghan Weeks is a 48 y.o. female who presents today for her Complete Annual Exam. She feels well. She reports exercising rarely but walks at work. She reports she is sleeping well.  She is overdue for mammogram. She denies breast complaints. She has labs to be done for bariatrics and will notify me when they are done so I can view in [place].  Mammogram - more than 5 years ago Pap no longer needed  OSA - she is using her CPAP nightly with restful sleep.  She denies issues with the equipment.  She does not have headaches, daytime somnolence or fatigue.  Anxiety Presents for follow-up visit. Patient reports no chest pain, dizziness, nervous/anxious behavior, palpitations or shortness of breath. Symptoms occur occasionally. The severity of symptoms is moderate.   Compliance with medications: bupropion 150 mg daily.   S/p bariatric surgery with recent revision - she is doing very well, she has lost almost 40 lbs.  She denies abdominal pain, change in bowel habits, nausea.  Review of Systems  Constitutional: Negative for chills, fatigue, fever and unexpected weight change.  HENT: Negative for congestion, hearing loss, tinnitus, trouble swallowing and voice change.   Eyes: Negative for visual disturbance.  Respiratory: Negative for cough, chest tightness, shortness of breath and wheezing.   Cardiovascular: Negative for chest pain, palpitations and leg swelling.  Gastrointestinal: Negative for abdominal pain, constipation, diarrhea and vomiting.  Endocrine: Negative for polydipsia and polyuria.  Genitourinary: Negative for dysuria, frequency, genital sores, vaginal bleeding and vaginal discharge.  Musculoskeletal: Negative for arthralgias, gait problem and joint swelling.   Skin: Positive for rash (psoriasis - current med not working very well). Negative for color change.  Neurological: Negative for dizziness, tremors, light-headedness and headaches.  Hematological: Negative for adenopathy. Does not bruise/bleed easily.  Psychiatric/Behavioral: Negative for dysphoric mood and sleep disturbance. The patient is not nervous/anxious.     Patient Active Problem List   Diagnosis Date Noted  . Recurrent major depressive disorder, in partial remission ([place]) 01/21/2019  . Bariatric surgery status 12/16/2017  . Sleep apnea   . Psoriasis   . Neuroma of foot   . History of basal cell cancer   . Depression   . Anxiety   . Chronic pain of right knee 05/18/2016  . Rectocele 06/25/2011    Allergies  Allergen Reactions  . Codeine Other (See Comments)    In Cough Syrup- Caused Migraines  . Levonorgestrel-Ethinyl Estrad     Past Surgical History:  Procedure Laterality Date  . ABDOMINAL HYSTERECTOMY    . BASAL CELL CARCINOMA EXCISION  03/06/2017   On [place]    . COLONOSCOPY    . EXCISION MORTON'S NEUROMA Right   . gastric sleeve surgery  2013   with repair of hiatal hernia  . REVISION OF GASTRIC SLEEVE TO BILIOPANCREATIC DIVERSION WITH DUODENAL SWITCH LAPAROSCOPIC  03/13/2018  . right elbow surgery    . right wrist surgery      Social History   Tobacco Use  . Smoking status: Former Smoker    Packs/day: 0.50    Years: 10.00    Pack years: 5.00    Types: Cigarettes    Quit date: 1995  Years since quitting: 25.6  . Smokeless tobacco: Never Used  Substance Use Topics  . Alcohol use: Yes    Comment: social  . Drug use: No    Medication list has been reviewed and updated.  Current Meds  Medication Sig  . albuterol (PROAIR HFA) 108 (90 Base) MCG/ACT inhaler Inhale 2 puffs into the lungs every 6 (six) hours as needed for wheezing or shortness of breath.  Marland Kitchen buPROPion (WELLBUTRIN XL) 150 MG 24 hr tablet Take 1 tablet  (150 mg total) by mouth 2 (two) times daily.  . calcium carbonate (TITRALAC) 420 MG CHEW chewable tablet Chew 500 mg by mouth. 4 capsules daily  . clobetasol cream (TEMOVATE) 0.05 % PLEASE SEE ATTACHED FOR DETAILED DIRECTIONS  . Menthol, Topical Analgesic, (ICY HOT EX) Apply 1 application topically 3 (three) times daily as needed (muscle pain).  . metroNIDAZOLE (METROGEL) 0.75 % gel Apply 1 application topically daily as needed (skin irritation).   . Multiple Vitamin (MULTI VITAMIN DAILY PO) Take by mouth.  . NON FORMULARY CPAP @@ 10 cm H2O  . tretinoin (RETIN-A) 0.025 % cream Apply 1 application topically at bedtime as needed (acne).   . [DISCONTINUED] buPROPion (WELLBUTRIN XL) 150 MG 24 hr tablet TAKE 1 TABLET BY MOUTH EVERY DAY    PHQ 2/9 Scores 01/21/2019 09/19/2018 04/10/2018 12/16/2017  PHQ - 2 Score 4 0 0 4  PHQ- 9 Score 8 0 1 11    BP Readings from Last 3 Encounters:  01/21/19 114/78  04/10/18 118/64  12/16/17 108/70    Physical Exam Vitals signs and nursing note reviewed.  Constitutional:      General: She is not in acute distress.    Appearance: She is well-developed.  HENT:     Head: Normocephalic and atraumatic.     Right Ear: Tympanic membrane and ear canal normal.     Left Ear: Tympanic membrane and ear canal normal.     Nose:     Right Sinus: No maxillary sinus tenderness.     Left Sinus: No maxillary sinus tenderness.     Mouth/Throat:     Pharynx: Uvula midline.  Eyes:     General: No scleral icterus.       Right eye: No discharge.        Left eye: No discharge.     Conjunctiva/sclera: Conjunctivae normal.  Neck:     Musculoskeletal: Normal range of motion. No erythema.     Thyroid: No thyromegaly.     Vascular: No carotid bruit.  Cardiovascular:     Rate and Rhythm: Normal rate and regular rhythm.     Pulses: Normal pulses.     Heart sounds: Normal heart sounds.  Pulmonary:     Effort: Pulmonary effort is normal. No respiratory distress.     Breath  sounds: No wheezing.  Chest:     Breasts:        Right: No mass, nipple discharge, skin change or tenderness.        Left: No mass, nipple discharge, skin change or tenderness.     Comments: Fibrocystic breast changes bilaterally - no dominant mass or tenderness Abdominal:     General: Bowel sounds are normal.     Palpations: Abdomen is soft.     Tenderness: There is no abdominal tenderness.  Musculoskeletal: Normal range of motion.  Lymphadenopathy:     Cervical: No cervical adenopathy.  Skin:    General: Skin is warm and dry.     Capillary  Refill: Capillary refill takes less than 2 seconds.     Findings: No rash.     Comments: Multiple tatoos  Neurological:     General: No focal deficit present.     Mental Status: She is alert and oriented to person, place, and time.     Cranial Nerves: No cranial nerve deficit.     Sensory: No sensory deficit.     Deep Tendon Reflexes: Reflexes are normal and symmetric.  Psychiatric:        Speech: Speech normal.        Behavior: Behavior normal.        Thought Content: Thought content normal.     Wt Readings from Last 3 Encounters:  01/21/19 171 lb (77.6 kg)  09/19/18 207 lb (93.9 kg)  04/10/18 207 lb (93.9 kg)    BP 114/78   Pulse 69   Ht 5\' 5"  (1.651 m)   Wt 171 lb (77.6 kg)   SpO2 98%   BMI 28.46 kg/m   Assessment and Plan: 1. Annual physical exam Normal exam Steady weight loss since surgery - continue healthy diet and vitamin supplements Will await lab results to be done today by bariatric surgeon - POCT urinalysis dipstick  2. Encounter for screening mammogram for breast cancer To be scheduled at [facility]; Future  3. Recurrent major depressive disorder, in partial remission ([facility]) Sx are worsening with more anxiety and irritability for unclear reasons Pt did not tolerate zoloft Will increase bupropion to 150 mg bid Follow up if needed - buPROPion (WELLBUTRIN XL) 150 MG 24 hr tablet; Take  1 tablet (150 mg total) by mouth 2 (two) times daily.  Dispense: 180 tablet; Refill: 1   Partially dictated using Editor, commissioning. Any errors are unintentional.  Halina Maidens, MD [facility]  01/21/2019

## 2019-01-28 DIAGNOSIS — I781 Nevus, non-neoplastic: Secondary | ICD-10-CM | POA: Diagnosis not present

## 2019-01-28 DIAGNOSIS — L820 Inflamed seborrheic keratosis: Secondary | ICD-10-CM | POA: Diagnosis not present

## 2019-01-28 DIAGNOSIS — L408 Other psoriasis: Secondary | ICD-10-CM | POA: Diagnosis not present

## 2019-01-28 DIAGNOSIS — L570 Actinic keratosis: Secondary | ICD-10-CM | POA: Diagnosis not present

## 2019-03-05 DIAGNOSIS — M79644 Pain in right finger(s): Secondary | ICD-10-CM | POA: Diagnosis not present

## 2019-03-17 DIAGNOSIS — Z9884 Bariatric surgery status: Secondary | ICD-10-CM | POA: Diagnosis not present

## 2019-03-17 DIAGNOSIS — Z6827 Body mass index (BMI) 27.0-27.9, adult: Secondary | ICD-10-CM | POA: Diagnosis not present

## 2019-04-15 ENCOUNTER — Telehealth: Payer: Self-pay

## 2019-04-15 NOTE — Telephone Encounter (Signed)
Patient called stating she is experiencing blood in urine and some mild left flank pain. She feels she is having a UTI. She said she is getting a tooth pulled today by the dentist and started on Amoxicillin yesterday for this. Wanted to know if this will cover her UTI.   Called back and spoke with pt. Informed her amoxicillin is typically not used for UTI's. Told her we would need to see her to check her urine before changing her to something different. She said she cannot come in this afternoon or tomorrow morning. Recommended UC before her symptoms get worse since she is unable to come in our office during our business hours due to work.  She verbalized understanding and said she will see UC after work.   Benedict Needy, CMA

## 2019-04-16 ENCOUNTER — Ambulatory Visit
Admission: EM | Admit: 2019-04-16 | Discharge: 2019-04-16 | Disposition: A | Discharge status: Home / Self Care | Payer: BLUE CROSS/BLUE SHIELD | Attending: Family Medicine | Admitting: Family Medicine

## 2019-04-16 ENCOUNTER — Other Ambulatory Visit: Payer: Self-pay

## 2019-04-16 ENCOUNTER — Encounter: Payer: Self-pay | Admitting: Emergency Medicine

## 2019-04-16 DIAGNOSIS — B379 Candidiasis, unspecified: Secondary | ICD-10-CM | POA: Diagnosis not present

## 2019-04-16 DIAGNOSIS — R310 Gross hematuria: Secondary | ICD-10-CM

## 2019-04-16 LAB — URINALYSIS, COMPLETE (UACMP) WITH MICROSCOPIC
Bacteria, UA: NONE SEEN
Glucose, UA: NEGATIVE mg/dL
Ketones, ur: 15 mg/dL — AB
Leukocytes,Ua: NEGATIVE
Nitrite: NEGATIVE
Protein, ur: 100 mg/dL — AB
Specific Gravity, Urine: >1.03 — ABNORMAL HIGH (ref 1.005–1.030)
Squamous Epithelial / HPF: NONE SEEN (ref 0–5)
WBC, UA: NONE SEEN WBC/hpf (ref 0–5)
pH: 5.5 (ref 5.0–8.0)

## 2019-04-16 MED ORDER — TAMSULOSIN HCL 0.4 MG PO CAPS: 0.4000 mg | ORAL_CAPSULE | Freq: Every day | ORAL | 0 refills | Status: DC

## 2019-04-16 MED ORDER — KETOROLAC TROMETHAMINE 10 MG PO TABS: 10.0000 mg | ORAL_TABLET | Freq: Four times a day (QID) | ORAL | 0 refills | Status: DC | PRN

## 2019-04-16 MED ORDER — FLUCONAZOLE 150 MG PO TABS: 150.0000 mg | ORAL_TABLET | Freq: Once | ORAL | 0 refills | Status: AC

## 2019-04-16 NOTE — Discharge Instructions (Addendum)
Fluids.  Medication as directed.  If persists, see Urology.  Take care  Dr. Lacinda Axon

## 2019-04-16 NOTE — ED Provider Notes (Signed)
[facility]    CSN: [id number] Arrival date & time: 04/16/19  1739  History   Chief Complaint Chief Complaint  Patient presents with  . Hematuria   HPI  48 year old female presents with abdominal pain and hematuria.  Patient reports that she developed left-sided lower abdominal pain 3 days ago.  She has intermittent proximal-isms of pain.  Patient reports that she has recently noted blood in her urine.  She states that the toilet water is discolored.  She denies back pain.  No fever.  She denies urinary frequency.  She states that when she urinates she feels like she is not emptying completely.  She is concerned that she has a UTI.  No medications or interventions tried.  Patient states that she has been told that she has kidney stones based off prior imaging.  No other associated symptoms.  No other complaints.  PMH, Surgical Hx, Family Hx, Social History reviewed and updated as below.  Past Medical History:  Diagnosis Date  . Anxiety   . Bronchitis   . Cholelithiasis   . Complication of anesthesia   . Depression   . History of basal cell cancer    forehead and nose  . Neuroma of foot   . PONV (postoperative nausea and vomiting)   . Psoriasis   . Renal stones 03/2017   right   . Sleep apnea 2007 or 2008    Patient Active Problem List   Diagnosis Date Noted  . Recurrent major depressive disorder, in partial remission ([place]) 01/21/2019  . Bariatric surgery status 12/16/2017  . Sleep apnea   . Psoriasis   . Neuroma of foot   . History of basal cell cancer   . Depression   . Anxiety   . Chronic pain of right knee 05/18/2016  . Rectocele 06/25/2011    Past Surgical History:  Procedure Laterality Date  . ABDOMINAL HYSTERECTOMY    . BASAL CELL CARCINOMA EXCISION  03/06/2017   On [place]    . COLONOSCOPY    . EXCISION MORTON'S NEUROMA Right   . gastric sleeve surgery  2013   with repair of hiatal hernia  . REVISION OF  GASTRIC SLEEVE TO BILIOPANCREATIC DIVERSION WITH DUODENAL SWITCH LAPAROSCOPIC  03/13/2018  . right elbow surgery    . right wrist surgery      OB History   No obstetric history on file.      Home Medications    Prior to Admission medications   Medication Sig Start Date End Date Taking? Authorizing Provider  albuterol (PROAIR HFA) 108 (90 Base) MCG/ACT inhaler Inhale 2 puffs into the lungs every 6 (six) hours as needed for wheezing or shortness of breath. 09/19/18  Yes Glean Hess, MD  buPROPion (WELLBUTRIN XL) 150 MG 24 hr tablet Take 1 tablet (150 mg total) by mouth 2 (two) times daily. 01/21/19  Yes Glean Hess, MD  calcium carbonate (TITRALAC) 420 MG CHEW chewable tablet Chew 500 mg by mouth. 4 capsules daily   Yes [provider]  Multiple Vitamin (MULTI VITAMIN DAILY PO) Take by mouth.   Yes [provider]  tretinoin (RETIN-A) 0.025 % cream Apply 1 application topically at bedtime as needed (acne).  10/04/14  Yes [provider]  fluconazole (DIFLUCAN) 150 MG tablet Take 1 tablet (150 mg total) by mouth once for 1 dose. Repeat dose in 72 hours. 04/16/19 04/16/19  Coral Spikes, DO  ketorolac (TORADOL) 10 MG tablet  Take 1 tablet (10 mg total) by mouth every 6 (six) hours as needed for moderate pain or severe pain. 04/16/19   Thurza Kwiecinski G, DO  NON FORMULARY CPAP @@ 10 cm H2O    [provider]  tamsulosin (FLOMAX) 0.4 MG CAPS capsule Take 1 capsule (0.4 mg total) by mouth daily. 04/16/19   Coral Spikes, DO    Family History Family History  Problem Relation Age of Onset  . Hematuria Mother   . Diabetes Father   . Hypertension Father   . Diabetes Sister   . Hypertension Sister   . Colon cancer Maternal Grandmother   . Uterine cancer Maternal Grandmother   . Kidney cancer Neg Hx   . Bladder Cancer Neg Hx     Social History Social History   Tobacco Use  . Smoking status: Former Smoker    Packs/day: 0.50    Years: 10.00     Pack years: 5.00    Types: Cigarettes    Quit date: 1995    Years since quitting: 25.9  . Smokeless tobacco: Never Used  Substance Use Topics  . Alcohol use: Yes    Comment: social  . Drug use: No     Allergies   Codeine and Levonorgestrel-ethinyl estrad   Review of Systems Review of Systems  Constitutional: Negative for fever.  Gastrointestinal: Positive for abdominal pain.  Genitourinary: Positive for hematuria.   Physical Exam Triage Vital Signs ED Triage Vitals  Enc Vitals Group     BP 04/16/19 1755 112/72     Pulse Rate 04/16/19 1755 64     Resp 04/16/19 1755 18     Temp 04/16/19 1755 99 F (37.2 C)     Temp Source 04/16/19 1755 Oral     SpO2 04/16/19 1755 100 %     Weight 04/16/19 1752 165 lb (74.8 kg)     Height 04/16/19 1752 5\' 5"  (1.651 m)     Head Circumference --      Peak Flow --      Pain Score 04/16/19 1752 0     Pain Loc --      Pain Edu? --      Excl. in [place]? --     Updated Vital Signs BP 112/72 (BP Location: Right Arm)   Pulse 64   Temp 99 F (37.2 C) (Oral)   Resp 18   Ht 5\' 5"  (1.651 m)   Wt 74.8 kg   SpO2 100%   BMI 27.46 kg/m   Visual Acuity Right Eye Distance:   Left Eye Distance:   Bilateral Distance:    Right Eye Near:   Left Eye Near:    Bilateral Near:     Physical Exam Vitals signs and nursing note reviewed.  Constitutional:      General: She is not in acute distress.    Appearance: Normal appearance. She is not ill-appearing.  HENT:     Head: Normocephalic and atraumatic.  Eyes:     General:        Right eye: No discharge.        Left eye: No discharge.     Conjunctiva/sclera: Conjunctivae normal.  Cardiovascular:     Rate and Rhythm: Normal rate and regular rhythm.     Heart sounds: No murmur.  Pulmonary:     Effort: Pulmonary effort is normal.     Breath sounds: Normal breath sounds. No wheezing, rhonchi or rales.  Abdominal:     General: There  is no distension.     Palpations: Abdomen is soft.      Comments: No significant tenderness on abdominal exam.  Neurological:     Mental Status: She is alert.  Psychiatric:        Mood and Affect: Mood normal.        Behavior: Behavior normal.    UC Treatments / Results  Labs (all labs ordered are listed, but only abnormal results are displayed) Labs Reviewed  URINALYSIS, COMPLETE (UACMP) WITH MICROSCOPIC - Abnormal; Notable for the following components:      Result Value   Color, Urine BROWN (*)    APPearance CLOUDY (*)    Specific Gravity, Urine >1.030 (*)    Hgb urine dipstick LARGE (*)    Bilirubin Urine MODERATE (*)    Ketones, ur 15 (*)    Protein, ur 100 (*)    All other components within normal limits  URINE CULTURE    EKG   Radiology No results found.  Procedures Procedures (including critical care time)  Medications Ordered in UC Medications - No data to display  Initial Impression / Assessment and Plan / UC Course  I have reviewed the triage vital signs and the nursing notes.  Pertinent labs & imaging results that were available during my care of the patient were reviewed by me and considered in my medical decision making (see chart for details).    48 year old female presents with hematuria.  Urine microscopy confirms.  Too numerous to count red blood cells.  Patient also had budding yeast noted in her urine.  No evidence of pyuria or bacteria to suggest UTI.  Given yeast noted, I am sending her urine for culture.  Patient has had a prior CT of her abdomen and pelvis.  Reviewed today.  She has a documented 2 mm stone in the left kidney.  She has multiple nonobstructing stones in the right kidney.  History concern for ureterolithiasis/passing kidney stone.  Placing on Flomax.  Toradol for pain.  Diflucan given yeast.  Awaiting urine culture.  Advised to strain urine.  Increase fluid intake.  If persists, needs urological evaluation.  Final Clinical Impressions(s) / UC Diagnoses   Final diagnoses:  Gross hematuria   Yeast infection     Discharge Instructions     Fluids.  Medication as directed.  If persists, see Urology.  Take care  Dr. Lacinda Axon    ED Prescriptions    Medication Sig Dispense Auth. Provider   tamsulosin (FLOMAX) 0.4 MG CAPS capsule Take 1 capsule (0.4 mg total) by mouth daily. 14 capsule Devani Odonnel G, DO   ketorolac (TORADOL) 10 MG tablet Take 1 tablet (10 mg total) by mouth every 6 (six) hours as needed for moderate pain or severe pain. 20 tablet Normand Damron G, DO   fluconazole (DIFLUCAN) 150 MG tablet Take 1 tablet (150 mg total) by mouth once for 1 dose. Repeat dose in 72 hours. 2 tablet Coral Spikes, DO     PDMP not reviewed this encounter.   Coral Spikes, [place] 04/16/19 1908

## 2019-04-16 NOTE — ED Triage Notes (Signed)
Patient c/o hematuria that started 3 days ago.

## 2019-04-18 ENCOUNTER — Other Ambulatory Visit: Payer: Self-pay

## 2019-04-18 ENCOUNTER — Emergency Department
Admission: EM | Admit: 2019-04-18 | Discharge: 2019-04-18 | Disposition: A | Discharge status: Home / Self Care | Payer: BC Managed Care – PPO | Attending: Emergency Medicine | Admitting: Emergency Medicine

## 2019-04-18 ENCOUNTER — Encounter: Payer: Self-pay | Admitting: Intensive Care

## 2019-04-18 ENCOUNTER — Emergency Department: Payer: BC Managed Care – PPO

## 2019-04-18 DIAGNOSIS — Z87891 Personal history of nicotine dependence: Secondary | ICD-10-CM | POA: Diagnosis not present

## 2019-04-18 DIAGNOSIS — N201 Calculus of ureter: Secondary | ICD-10-CM | POA: Diagnosis not present

## 2019-04-18 DIAGNOSIS — Z79899 Other long term (current) drug therapy: Secondary | ICD-10-CM | POA: Diagnosis not present

## 2019-04-18 DIAGNOSIS — N132 Hydronephrosis with renal and ureteral calculous obstruction: Secondary | ICD-10-CM | POA: Diagnosis not present

## 2019-04-18 DIAGNOSIS — R1012 Left upper quadrant pain: Secondary | ICD-10-CM | POA: Diagnosis not present

## 2019-04-18 LAB — COMPREHENSIVE METABOLIC PANEL
ALT: 46 U/L — ABNORMAL HIGH (ref 0–44)
AST: 27 U/L (ref 15–41)
Albumin: 4.3 g/dL (ref 3.5–5.0)
Alkaline Phosphatase: 82 U/L (ref 38–126)
Anion gap: 10 (ref 5–15)
BUN: 20 mg/dL (ref 6–20)
CO2: 25 mmol/L (ref 22–32)
Calcium: 9.1 mg/dL (ref 8.9–10.3)
Chloride: 102 mmol/L (ref 98–111)
Creatinine, Ser: 1.43 mg/dL — ABNORMAL HIGH (ref 0.44–1.00)
GFR calc Af Amer: 50 mL/min — ABNORMAL LOW (ref >60)
GFR calc non Af Amer: 43 mL/min — ABNORMAL LOW (ref >60)
Glucose, Bld: 125 mg/dL — ABNORMAL HIGH (ref 70–99)
Potassium: 4.1 mmol/L (ref 3.5–5.1)
Sodium: 137 mmol/L (ref 135–145)
Total Bilirubin: 0.7 mg/dL (ref 0.3–1.2)
Total Protein: 7.5 g/dL (ref 6.5–8.1)

## 2019-04-18 LAB — CBC WITH DIFFERENTIAL/PLATELET
Abs Immature Granulocytes: 0.02 10*3/uL (ref 0.00–0.07)
Basophils Absolute: 0 10*3/uL (ref 0.0–0.1)
Basophils Relative: 0 %
Eosinophils Absolute: 0 10*3/uL (ref 0.0–0.5)
Eosinophils Relative: 0 %
HCT: 38.2 % (ref 36.0–46.0)
Hemoglobin: 12.5 g/dL (ref 12.0–15.0)
Immature Granulocytes: 0 %
Lymphocytes Relative: 7 %
Lymphs Abs: 0.6 10*3/uL — ABNORMAL LOW (ref 0.7–4.0)
MCH: 30.1 pg (ref 26.0–34.0)
MCHC: 32.7 g/dL (ref 30.0–36.0)
MCV: 92 fL (ref 80.0–100.0)
Monocytes Absolute: 0.2 10*3/uL (ref 0.1–1.0)
Monocytes Relative: 2 %
Neutro Abs: 7.5 10*3/uL (ref 1.7–7.7)
Neutrophils Relative %: 91 %
Platelets: 301 10*3/uL (ref 150–400)
RBC: 4.15 MIL/uL (ref 3.87–5.11)
RDW: 11.5 % (ref 11.5–15.5)
WBC: 8.4 10*3/uL (ref 4.0–10.5)
nRBC: 0 % (ref 0.0–0.2)

## 2019-04-18 LAB — URINE CULTURE: Culture: NO GROWTH

## 2019-04-18 MED ORDER — ONDANSETRON 4 MG PO TBDP: 4.0000 mg | ORAL_TABLET | Freq: Three times a day (TID) | ORAL | 0 refills | Status: DC | PRN

## 2019-04-18 MED ORDER — MORPHINE SULFATE (PF) 4 MG/ML IV SOLN
4.0000 mg | Freq: Once | INTRAVENOUS | Status: AC
  Administered 2019-04-18: 4 mg via INTRAVENOUS
  Filled 2019-04-18: qty 1

## 2019-04-18 MED ORDER — FENTANYL CITRATE (PF) 100 MCG/2ML IJ SOLN
50.0000 ug | INTRAMUSCULAR | Status: DC | PRN
  Administered 2019-04-18: 50 ug via INTRAVENOUS
  Filled 2019-04-18: qty 2

## 2019-04-18 MED ORDER — OXYCODONE HCL 5 MG PO TABS: 5.0000 mg | ORAL_TABLET | Freq: Three times a day (TID) | ORAL | 0 refills | Status: DC | PRN

## 2019-04-18 MED ORDER — ONDANSETRON HCL 4 MG/2ML IJ SOLN
4.0000 mg | Freq: Once | INTRAMUSCULAR | Status: AC
  Administered 2019-04-18: 4 mg via INTRAVENOUS
  Filled 2019-04-18: qty 2

## 2019-04-18 MED ORDER — SODIUM CHLORIDE 0.9 % IV BOLUS
1000.0000 mL | Freq: Once | INTRAVENOUS | Status: AC
  Administered 2019-04-18: 1000 mL via INTRAVENOUS

## 2019-04-18 NOTE — Discharge Instructions (Signed)
Call Monday morning for a follow up appointment.  Return to the ER over the weekend if pain and/or nausea returns and you are unable to keep fluids/medications down.  STOP taking the Toradol

## 2019-04-18 NOTE — ED Provider Notes (Signed)
[facility] Emergency Department Provider Note ____________________________________________   First MD Initiated Contact with Patient 04/18/19 1510     (approximate)  I have reviewed the triage vital signs and the nursing notes.   HISTORY  Chief Complaint Nephrolithiasis  HPI Meghan Weeks is a 48 y.o. female who presents to the emergency department for treatment and evaluation of left flank pain for the past 4 days.  She has had some hematuria as well.  No fever.  No dysuria or frequency.  She has a history of nephrolithiasis with a history of right sided renal atrophy.  Patient states symptoms started suddenly and have progressively worsened.  She was evaluated at urgent care yesterday and given Toradol tablets, Flomax, and Diflucan.  The pain has intensified and she is now vomiting.  She has been unable to keep anything down today due to the pain.   Past Medical History:  Diagnosis Date   Anxiety    Bronchitis    Cholelithiasis    Complication of anesthesia    Depression    History of basal cell cancer    forehead and nose   Neuroma of foot    PONV (postoperative nausea and vomiting)    Psoriasis    Renal stones 03/2017   right    Sleep apnea 2007 or 2008    Patient Active Problem List   Diagnosis Date Noted   Recurrent major depressive disorder, in partial remission ([place]) 01/21/2019   Bariatric surgery status 12/16/2017   Sleep apnea    Psoriasis    Neuroma of foot    History of basal cell cancer    Depression    Anxiety    Chronic pain of right knee 05/18/2016   Rectocele 06/25/2011    Past Surgical History:  Procedure Laterality Date   ABDOMINAL HYSTERECTOMY     BASAL CELL CARCINOMA EXCISION  03/06/2017   On [place] Right    gastric sleeve surgery  2013   with repair of hiatal hernia   REVISION OF GASTRIC SLEEVE TO  BILIOPANCREATIC DIVERSION WITH DUODENAL SWITCH LAPAROSCOPIC  03/13/2018   right elbow surgery     right wrist surgery      Prior to Admission medications   Medication Sig Start Date End Date Taking? Authorizing Provider  albuterol (PROAIR HFA) 108 (90 Base) MCG/ACT inhaler Inhale 2 puffs into the lungs every 6 (six) hours as needed for wheezing or shortness of breath. 09/19/18   Glean Hess, MD  buPROPion (WELLBUTRIN XL) 150 MG 24 hr tablet Take 1 tablet (150 mg total) by mouth 2 (two) times daily. 01/21/19   Glean Hess, MD  calcium carbonate (TITRALAC) 420 MG CHEW chewable tablet Chew 500 mg by mouth. 4 capsules daily    [provider]  Multiple Vitamin (MULTI VITAMIN DAILY PO) Take by mouth.    [provider]  NON FORMULARY CPAP @@ 10 cm H2O    [provider]  ondansetron (ZOFRAN-ODT) 4 MG disintegrating tablet Take 1 tablet (4 mg total) by mouth every 8 (eight) hours as needed for nausea or vomiting. 04/18/19   Elayna Tobler B, FNP  oxyCODONE (ROXICODONE) 5 MG immediate release tablet Take 1 tablet (5 mg total) by mouth every 8 (eight) hours as needed. 04/18/19 04/17/20  Pradeep Beaubrun, Johnette Abraham B, FNP  tamsulosin (FLOMAX) 0.4 MG CAPS capsule Take 1 capsule (0.4 mg total)  by mouth daily. 04/16/19   Coral Spikes, DO  tretinoin (RETIN-A) 0.025 % cream Apply 1 application topically at bedtime as needed (acne).  10/04/14   [provider]    Allergies Codeine and Levonorgestrel-ethinyl estrad  Family History  Problem Relation Age of Onset   Hematuria Mother    Diabetes Father    Hypertension Father    Diabetes Sister    Hypertension Sister    Colon cancer Maternal Grandmother    Uterine cancer Maternal Grandmother    Kidney cancer Neg Hx    Bladder Cancer Neg Hx     Social History Social History   Tobacco Use   Smoking status: Former Smoker    Packs/day: 0.50    Years: 10.00    Pack years: 5.00    Types: Cigarettes    Quit  date: 1995    Years since quitting: 25.9   Smokeless tobacco: Never Used  Substance Use Topics   Alcohol use: Yes    Comment: social   Drug use: No    Review of Systems  Constitutional: No fever/chills Eyes: No visual changes. ENT: No sore throat. Cardiovascular: Denies chest pain. Respiratory: Denies shortness of breath. Gastrointestinal: No abdominal pain.  Positive for nausea and vomiting.  Negative for constipation or diarrhea. Genitourinary: Negative for dysuria.  Positive for hematuria Musculoskeletal: Positive for left flank pain. Skin: Negative for rash. Neurological: Negative for headaches, focal weakness or numbness  ____________________________________________   PHYSICAL EXAM:  VITAL SIGNS: ED Triage Vitals  Enc Vitals Group     BP 04/18/19 1334 103/66     Pulse Rate 04/18/19 1334 75     Resp 04/18/19 1334 18     Temp 04/18/19 1334 98.7 F (37.1 C)     Temp Source 04/18/19 1334 Oral     SpO2 04/18/19 1334 100 %     Weight 04/18/19 1335 165 lb (74.8 kg)     Height 04/18/19 1335 5\' 5"  (1.651 m)     Head Circumference --      Peak Flow --      Pain Score 04/18/19 1339 10     Pain Loc --      Pain Edu? --      Excl. in [place]? --     Constitutional: Alert and oriented.  Uncomfortable appearing and in no acute distress. Eyes: Conjunctivae are normal. Head: Atraumatic. Nose: No congestion/rhinnorhea. Mouth/Throat: Mucous membranes are moist.  Oropharynx non-erythematous. Neck: No stridor.   Hematological/Lymphatic/Immunilogical: No cervical lymphadenopathy. Cardiovascular: Normal rate, regular rhythm. Good peripheral circulation. Respiratory: Normal respiratory effort.  No retractions. Lungs CTAB. Gastrointestinal: Soft with tenderness in the left lower quadrant/pelvic area.  Left side CVA tenderness. Genitourinary:  Musculoskeletal: No lower extremity tenderness nor edema.  No joint effusions. Neurologic:  Normal speech and language. No gross focal  neurologic deficits are appreciated. No gait instability. Skin:  Skin is warm, dry and intact. No rash noted. Psychiatric: Mood and affect are normal. Speech and behavior are normal.  ____________________________________________   LABS (all labs ordered are listed, but only abnormal results are displayed)  Labs Reviewed  CBC WITH DIFFERENTIAL/PLATELET - Abnormal; Notable for the following components:      Result Value   Lymphs Abs 0.6 (*)    All other components within normal limits  COMPREHENSIVE METABOLIC PANEL - Abnormal; Notable for the following components:   Glucose, Bld 125 (*)    Creatinine, Ser 1.43 (*)    ALT 46 (*)  GFR calc non Af Amer 43 (*)    GFR calc Af Amer 50 (*)    All other components within normal limits   ____________________________________________  EKG  Not indicated ____________________________________________  RADIOLOGY  ED MD interpretation:    5 mm distal left ureteral calculus at the UVJ with mild to moderate hydronephrosis  Official radiology report(s): Ct Renal Stone Study  Result Date: 04/18/2019 CLINICAL DATA:  Flank pain on the left EXAM: CT ABDOMEN AND PELVIS WITHOUT CONTRAST TECHNIQUE: Multidetector CT imaging of the abdomen and pelvis was performed following the standard protocol without IV contrast. COMPARISON:  CT abdomen pelvis dated 04/11/2017. FINDINGS: Lower chest: No acute abnormality. Hepatobiliary: No focal liver abnormality is seen. Status post cholecystectomy. No biliary dilatation. Pancreas: Unremarkable. No pancreatic ductal dilatation or surrounding inflammatory changes. Spleen: Normal in size without focal abnormality. Adrenals/Urinary Tract: Adrenal glands are unremarkable. The right kidney is atrophic with multiple nonobstructive renal calculi measuring up to 5 mm. The left kidney is normal in size. There is mild to moderate left hydroureteronephrosis with an obstructing distal ureteral calculus measuring 5 mm near the  left ureterovesical junction. The bladder is decompressed. Stomach/Bowel: The patient is status post gastric bypass. The appendix appears normal. There is no bowel wall thickening, distention, or inflammatory changes. Vascular/Lymphatic: No significant vascular findings are present. No enlarged abdominal or pelvic lymph nodes. Reproductive: Status post hysterectomy. No adnexal masses. Other: No abdominal wall hernia or abnormality. No abdominopelvic ascites. Musculoskeletal: No acute or significant osseous findings. IMPRESSION: Obstructing 5 mm distal left ureteral calculus with mild-to-moderate hydroureteronephrosis. Electronically Signed   By: Zerita Boers M.D.   On: 04/18/2019 15:44    ____________________________________________   PROCEDURES  Procedure(s) performed (including Critical Care):  Procedures  ____________________________________________   INITIAL IMPRESSION / ASSESSMENT AND PLAN     48 year old female presenting to the emergency department with left flank pain that is now radiating into the left lower quadrant/pelvic area.  Symptoms started 4 days ago.  See HPI for further details.  Plan will be to repeat renal stone study.  The last CT was performed 2 years ago.  Pain medication and IV fluids will also be given.  Patient agrees with this plan.  DIFFERENTIAL DIAGNOSIS  Nephrolithiasis, ureterolithiasis, hydronephrosis, obstructive renal stone  ED COURSE  CBC is normal.  No indication of infection.  Creatinine is mildly elevated at 1.4 which is significantly higher than the patient's baseline.  BUN is normal at 20.  ----------------------------------------- 4:27 PM on 04/18/2019 -----------------------------------------  5 mm obstructing stone near the left ureterovesical junction with mild to moderate hydroureteronephrosis was visualized on CT.  Dr. Jeffie Pollock was consulted.  If patient's pain can be controlled he feels that it would be appropriate to discharge her with  follow-up in the urology clinic Monday or Tuesday.  If pain not well controlled here, he recommends admission for pain management and then consult for urology.  This was discussed with the patient.  She feels that if her nausea and pain can be controlled with medications that she will be able to go home.  IV fluids are continuing to infuse.  Upon completion, she will be discharged unless something changes.  ----------------------------------------- 5:37 PM on 04/18/2019 -----------------------------------------  Patient's pain remains fairly well controlled.  She is comfortable being discharged home.  1 additional dose of morphine will be given prior to discharge.  Prescriptions have been submitted to her pharmacy.  She was advised to stay n.p.o. after midnight on Sunday and call Dr.  Stoioff's office on Monday morning.  She was also advised to continue using the urine strainer as recommended by the provider at urgent care.  She was strongly encouraged to keep well-hydrated.  She was also advised to continue using the Flomax and stop taking the Toradol. ____________________________________________   FINAL CLINICAL IMPRESSION(S) / ED DIAGNOSES  Final diagnoses:  Ureterolithiasis     ED Discharge Orders         Ordered    oxyCODONE (ROXICODONE) 5 MG immediate release tablet  Every 8 hours PRN     04/18/19 1731    ondansetron (ZOFRAN-ODT) 4 MG disintegrating tablet  Every 8 hours PRN     04/18/19 1731           Note:  This document was prepared using Dragon voice recognition software and may include unintentional dictation errors.   Victorino Dike, FNP 04/18/19 1741    Meghan Kitten, MD 04/30/19 2123

## 2019-04-18 NOTE — ED Notes (Addendum)
Pt presents to ED from home c/o R flank pain. Pt has been constantly nausea and vomiting since 0700 this am. Pt states she does not know how many times she has vomited since this morning. Pt has not other complaints and NAD at this time. Denies CP, SOB, and fever.

## 2019-04-18 NOTE — ED Triage Notes (Signed)
Patient had confirmed kidney stone and sent home with flomax and tramadol. Experiencing left sided flank pain. Checked in to ER due to pain

## 2019-04-21 ENCOUNTER — Other Ambulatory Visit: Payer: Self-pay | Admitting: Urology

## 2019-04-21 ENCOUNTER — Ambulatory Visit
Admission: RE | Admit: 2019-04-21 | Discharge: 2019-04-21 | Disposition: A | Discharge status: Home / Self Care | Payer: BC Managed Care – PPO | Attending: Urology | Admitting: Urology

## 2019-04-21 ENCOUNTER — Other Ambulatory Visit: Payer: Self-pay

## 2019-04-21 ENCOUNTER — Other Ambulatory Visit: Payer: Self-pay | Admitting: Radiology

## 2019-04-21 ENCOUNTER — Ambulatory Visit
Admission: RE | Admit: 2019-04-21 | Discharge: 2019-04-21 | Disposition: A | Discharge status: Home / Self Care | Payer: BC Managed Care – PPO | Source: Ambulatory Visit | Attending: Urology | Admitting: Urology

## 2019-04-21 ENCOUNTER — Ambulatory Visit: Payer: BC Managed Care – PPO | Admitting: Urology

## 2019-04-21 ENCOUNTER — Encounter: Payer: Self-pay | Admitting: Urology

## 2019-04-21 VITALS — BP 114/63 | HR 70 | Ht 65.0 in | Wt 165.0 lb

## 2019-04-21 DIAGNOSIS — N200 Calculus of kidney: Secondary | ICD-10-CM

## 2019-04-21 DIAGNOSIS — N201 Calculus of ureter: Secondary | ICD-10-CM

## 2019-04-21 MED ORDER — PROMETHAZINE HCL 12.5 MG PO TABS: 12.5000 mg | ORAL_TABLET | Freq: Four times a day (QID) | ORAL | 0 refills | Status: DC | PRN

## 2019-04-21 NOTE — Progress Notes (Signed)
04/21/19 2:05 PM   Meghan Weeks 1970-12-11 CB:[id number]  Referring provider: Glean Hess, MD [address] [place],  [place] [address]  CC: Left distal ureteral stone  HPI: I saw Meghan Weeks in urology clinic today for evaluation of a 9 mm left distal ureteral stone.  Meghan Weeks is a healthy 48 year old female who presented to the ER on 04/18/2019 with 1 week of hematuria and left-sided flank pain.  Work-up in the ED showed a 9 mm left distal ureteral stone with upstream hydronephrosis, an atrophic right kidney, urinalysis showed some yeast but was otherwise benign, and urine culture showed no growth.  CBC was within normal limits, and CMP was notable for an AKI with creatinine of 1.43 from a baseline of 0.9.  Meghan Weeks denies any fevers, chills, or dysuria.  Meghan Weeks continues to make urine.  Meghan Weeks continues to have intermittent left renal colic and nausea.  Meghan Weeks denies any prior episodes of stones.   PMH: Past Medical History:  Diagnosis Date  . Anxiety   . Bronchitis   . Cholelithiasis   . Complication of anesthesia   . Depression   . History of basal cell cancer    forehead and nose  . Neuroma of foot   . PONV (postoperative nausea and vomiting)   . Psoriasis   . Renal stones 03/2017   right   . Sleep apnea 2007 or 2008    Surgical History: Past Surgical History:  Procedure Laterality Date  . ABDOMINAL HYSTERECTOMY    . BASAL CELL CARCINOMA EXCISION  03/06/2017   On Roff    . COLONOSCOPY    . EXCISION MORTON'S NEUROMA Right   . gastric sleeve surgery  2013   with repair of hiatal hernia  . REVISION OF GASTRIC SLEEVE TO BILIOPANCREATIC DIVERSION WITH DUODENAL SWITCH LAPAROSCOPIC  03/13/2018  . right elbow surgery    . right wrist surgery     Allergies:  Allergies  Allergen Reactions  . Codeine Other (See Comments)    In Cough Syrup- Caused Migraines  . Levonorgestrel-Ethinyl Estrad     Family History: Family History  Problem  Relation Age of Onset  . Hematuria Mother   . Diabetes Father   . Hypertension Father   . Diabetes Sister   . Hypertension Sister   . Colon cancer Maternal Grandmother   . Uterine cancer Maternal Grandmother   . Kidney cancer Neg Hx   . Bladder Cancer Neg Hx     Social History:  reports that Meghan Weeks quit smoking about 25 years ago. Her smoking use included cigarettes. Meghan Weeks has a 5.00 pack-year smoking history. Meghan Weeks has never used smokeless tobacco. Meghan Weeks reports current alcohol use. Meghan Weeks reports that Meghan Weeks does not use drugs.  ROS: Please see flowsheet from today's date for complete review of systems.  Physical Exam: BP 114/63   Pulse 70   Ht 5\' 5"  (1.651 m)   Wt 165 lb (74.8 kg)   BMI 27.46 kg/m    Constitutional:  Alert and oriented, No acute distress. Cardiovascular: Regular rate and rhythm Respiratory: Clear to auscultation bilaterally GI: Abdomen is soft, nontender, nondistended, no abdominal masses GU: Left CVA tenderness Lymph: No cervical or inguinal lymphadenopathy. Skin: No rashes, bruises or suspicious lesions. Neurologic: Grossly intact, no focal deficits, moving all 4 extremities. Psychiatric: Normal mood and affect.  Laboratory Data: Reviewed, see HPI  Pertinent Imaging: Reviewed, see HPI  Assessment & Plan:   In summary, Meghan Weeks is  a healthy 48 year old female with a known atrophic right kidney who has had 10 days of intermittent left renal colic secondary to a 9 mm left distal ureteral stone with AKI.  Meghan Weeks has no clinical or laboratory signs of infection.  We discussed various treatment options for urolithiasis including observation with or without medical expulsive therapy, shockwave lithotripsy (SWL), ureteroscopy and laser lithotripsy with stent placement, and percutaneous nephrolithotomy.  We discussed that management is based on stone size, location, density, patient co-morbidities, and patient preference.   Stones <32mm in size have a >80% spontaneous passage  rate. Data surrounding the use of tamsulosin for medical expulsive therapy is controversial, but meta analyses suggests it is most efficacious for distal stones between 5-56mm in size. Possible side effects include dizziness/lightheadedness, and retrograde ejaculation.  SWL has a lower stone free rate in a single procedure, but also a lower complication rate compared to ureteroscopy and avoids a stent and associated stent related symptoms. Possible complications include renal hematoma, steinstrasse, and need for additional treatment.  Ureteroscopy with laser lithotripsy and stent placement has a higher stone free rate than SWL in a single procedure, however increased complication rate including possible infection, ureteral injury, bleeding, and stent related morbidity. Common stent related symptoms include dysuria, urgency/frequency, and flank pain.  I strongly recommended ureteroscopy in the setting of her atrophic right kidney and AKI. After an extensive discussion of the risks and benefits of the above treatment options, the patient would like to proceed with left ureteroscopy, laser lithotripsy, and stent placement.  We discussed urgent return precautions including fever/chills, anuria, or uncontrolled pain.  -Fluconazole given for yeast in urine -Phenergan added for poor nausea control and Zofran -Will schedule left ureteroscopy, laser lithotripsy, stent placement this week  A total of 30 minutes were spent face-to-face with the patient, greater than 50% was spent in patient education, counseling, and coordination of care regarding left ureteral stone and treatment options.    Billey Co, [facility] [address], [place], [place] [address] [phone]

## 2019-04-21 NOTE — Patient Instructions (Addendum)
Laser Therapy for Kidney Stones °Laser therapy for kidney stones is a procedure to break up small, hard mineral deposits that form in the kidney (kidney stones). The procedure is done using a device that produces a focused beam of light (laser). The laser breaks up kidney stones into pieces that are small enough to be passed out of the body through urination or removed from the body during the procedure. You may need laser therapy if you have kidney stones that are painful or block your urinary tract. °This procedure is done by inserting a tube (ureteroscope) into your kidney through the urethral opening. The urethra is the part of the body that drains urine from the bladder. In women, the urethra opens above the vaginal opening. In men, the urethra opens at the tip of the penis. The ureteroscope is inserted through the urethra, and surgical instruments are moved through the bladder and the muscular tube that connects the kidney to the bladder (ureter) until they reach the kidney. °Tell a health care provider about: °· Any allergies you have. °· All medicines you are taking, including vitamins, herbs, eye drops, creams, and over-the-counter medicines. °· Any problems you or family members have had with anesthetic medicines. °· Any blood disorders you have. °· Any surgeries you have had. °· Any medical conditions you have. °· Whether you are pregnant or may be pregnant. °What are the risks? °Generally, this is a safe procedure. However, problems may occur, including: °· Infection. °· Bleeding. °· Allergic reactions to medicines. °· Damage to the urethra, bladder, or ureter. °· Urinary tract infection (UTI). °· Narrowing of the urethra (urethral stricture). °· Difficulty passing urine. °· Blockage of the kidney caused by a fragment of kidney stone. °What happens before the procedure? °Medicines °· Ask your health care provider about: °? Changing or stopping your regular medicines. This is especially important if you  are taking diabetes medicines or blood thinners. °? Taking medicines such as aspirin and ibuprofen. These medicines can thin your blood. Do not take these medicines unless your health care provider tells you to take them. °? Taking over-the-counter medicines, vitamins, herbs, and supplements. °Eating and drinking °Follow instructions from your health care provider about eating and drinking, which may include: °· 8 hours before the procedure - stop eating heavy meals or foods, such as meat, fried foods, or fatty foods. °· 6 hours before the procedure - stop eating light meals or foods, such as toast or cereal. °· 6 hours before the procedure - stop drinking milk or drinks that contain milk. °· 2 hours before the procedure - stop drinking clear liquids. °Staying hydrated °Follow instructions from your health care provider about hydration, which may include: °· Up to 2 hours before the procedure - you may continue to drink clear liquids, such as water, clear fruit juice, black coffee, and plain tea. ° °General instructions °· You may have a physical exam before the procedure. You may also have tests, such as imaging tests and blood or urine tests. °· If your ureter is too narrow, your health care provider may place a soft, flexible tube (stent) inside of it. The stent may be placed days or weeks before your laser therapy procedure. °· Plan to have someone take you home from the hospital or clinic. °· If you will be going home right after the procedure, plan to have someone stay with you for 24 hours. °· Do not use any products that contain nicotine or tobacco for at least 4   weeks before the procedure. These products include cigarettes, e-cigarettes, and chewing tobacco. If you need help quitting, ask your health care provider.  Ask your health care provider: ? How your surgical site will be marked or identified. ? What steps will be taken to help prevent infection. These may include:  Removing hair at the surgery  site.  Washing skin with a germ-killing soap.  Taking antibiotic medicine. What happens during the procedure?   An IV will be inserted into one of your veins.  You will be given one or more of the following: ? A medicine to help you relax (sedative). ? A medicine to numb the area (local anesthetic). ? A medicine to make you fall asleep (general anesthetic).  A ureteroscope will be inserted into your urethra. The ureteroscope will send images to a video screen in the operating room to guide your surgeon to the area of your kidney that will be treated.  A small, flexible tube will be threaded through the ureteroscope and into your bladder and ureter, up to your kidney.  The laser device will be inserted into your kidney through the tube. Your surgeon will pulse the laser on and off to break up kidney stones.  A surgical instrument that has a tiny wire basket may be inserted through the tube into your kidney to remove the pieces of broken kidney stone. The procedure may vary among health care providers and hospitals. What happens after the procedure?  Your blood pressure, heart rate, breathing rate, and blood oxygen level will be monitored until you leave the hospital or clinic.  You will be given pain medicine as needed.  You may continue to receive antibiotics.  You may have a stent temporarily placed in your ureter.  Do not drive for 24 hours if you were given a sedative during your procedure.  You may be given a strainer to collect any stone fragments that you pass in your urine. Your health care provider may have these tested. Summary  Laser therapy for kidney stones is a procedure to break up kidney stones into pieces that are small enough to be passed out of the body through urination or removed during the procedure.  Follow instructions from your health care provider about eating and drinking before the procedure.  During the procedure, the ureteroscope will send images  to a video screen to guide your surgeon to the area of your kidney that will be treated.  Do not drive for 24 hours if you were given a sedative during your procedure. This information is not intended to replace advice given to you by your health care provider. Make sure you discuss any questions you have with your health care provider. Document Released: 06/10/2015 Document Revised: 01/23/2018 Document Reviewed: 01/23/2018 Elsevier Patient Education  2020 [place] for Kidney Stones, Care After This sheet gives you information about how to care for yourself after your procedure. Your health care provider may also give you more specific instructions. If you have problems or questions, contact your health care provider. What can I expect after the procedure? After the procedure, it is common to have:  Pain.  A burning sensation while urinating.  Small amounts of blood in your urine.  A need to urinate frequently.  Pieces of kidney stone in your urine.  Mild discomfort when urinating that may be felt in the back. You may experience this if you have a flexible tube (stent) in your ureter. Follow these instructions  at home:  Medicines  Take over-the-counter and prescription medicines only as told by your health care provider.  If you were prescribed an antibiotic medicine, take it as told by your health care provider. Do not stop taking the antibiotic even if you start to feel better.  Ask your health care provider if the medicine prescribed to you: ? Requires you to avoid driving or using heavy machinery. ? Can cause constipation. You may need to take actions to prevent or treat constipation, such as:  Take over-the-counter or prescription medicines.  Eat foods that are high in fiber, such as beans, whole grains, and fresh fruits and vegetables.  Limit foods that are high in fat and processed sugars, such as fried or sweet foods. Activity  Return to your  normal activities as told by your health care provider. Ask your health care provider what activities are safe for you.  Do not drive for 24 hours if you were given a sedative during your procedure. General instructions  If your health care provider approves, you may take a warm bath to ease discomfort and burning.  Drink enough fluid to keep your urine pale yellow. Your health care provider may recommend drinking two 8 oz (237 mL) glasses of water per hour for a few hours after your procedure.  You may be asked to strain your urine to collect any stone fragments that you pass. These fragments may be tested.  Keep all follow-up visits as told by your health care provider. This is important. If you have a stent, you will need to return to your health care provider to have the stent removed. Contact a health care provider if you:  Have pain or a burning feeling that lasts more than 2 days.  Feel nauseous.  Vomit more and more often.  Have difficulty urinating.  Have pain that gets worse or does not get better with medicine. Get help right away if:  You are unable to urinate, even if your bladder feels full.  You have: ? Bright red blood or blood clots in your urine. ? More blood in your urine. ? Severe pain or discomfort. ? A fever or shaking chills. ? Abdominal pain. ? Difficulty breathing. ? Swelling in your legs. Summary  After the procedure, it is common to have a burning sensation while urinating and small amounts of blood in your urine.  Take over-the-counter and prescription medicines only as told by your health care provider.  Drink enough fluid to keep your urine pale yellow.  Keep all follow-up visits as told by your health care provider. This is important. This information is not intended to replace advice given to you by your health care provider. Make sure you discuss any questions you have with your health care provider. Document Released: 06/10/2015 Document  Revised: 01/23/2018 Document Reviewed: 01/23/2018 Elsevier Patient Education  [place].  Ureteral Stent Implantation, Care After This sheet gives you information about how to care for yourself after your procedure. Your health care provider may also give you more specific instructions. If you have problems or questions, contact your health care provider. What can I expect after the procedure? After the procedure, it is common to have:  Nausea.  Mild pain when you urinate. You may feel this pain in your lower back or lower abdomen. The pain should stop within a few minutes after you urinate. This may last for up to 1 week.  A small amount of blood in your urine for several  days. Follow these instructions at home: Medicines  Take over-the-counter and prescription medicines only as told by your health care provider.  If you were prescribed an antibiotic medicine, take it as told by your health care provider. Do not stop taking the antibiotic even if you start to feel better.  Do not drive for 24 hours if you were given a sedative during your procedure.  Ask your health care provider if the medicine prescribed to you requires you to avoid driving or using heavy machinery. Activity  Rest as told by your health care provider.  Avoid sitting for a long time without moving. Get up to take short walks every 1-2 hours. This is important to improve blood flow and breathing. Ask for help if you feel weak or unsteady.  Return to your normal activities as told by your health care provider. Ask your health care provider what activities are safe for you. General instructions   Watch for any blood in your urine. Call your health care provider if the amount of blood in your urine increases.  If you have a catheter: ? Follow instructions from your health care provider about taking care of your catheter and collection bag. ? Do not take baths, swim, or use a hot tub until your health care  provider approves. Ask your health care provider if you may take showers. You may only be allowed to take sponge baths.  Drink enough fluid to keep your urine pale yellow.  Do not use any products that contain nicotine or tobacco, such as cigarettes, e-cigarettes, and chewing tobacco. These can delay healing after surgery. If you need help quitting, ask your health care provider.  Keep all follow-up visits as told by your health care provider. This is important. Contact a health care provider if:  You have pain that gets worse or does not get better with medicine, especially pain when you urinate.  You have difficulty urinating.  You feel nauseous or you vomit repeatedly during a period of more than 2 days after the procedure. Get help right away if:  Your urine is dark red or has blood clots in it.  You are leaking urine (have incontinence).  The end of the stent comes out of your urethra.  You cannot urinate.  You have sudden, sharp, or severe pain in your abdomen or lower back.  You have a fever.  You have swelling or pain in your legs.  You have difficulty breathing. Summary  After the procedure, it is common to have mild pain when you urinate that goes away within a few minutes after you urinate. This may last for up to 1 week.  Watch for any blood in your urine. Call your health care provider if the amount of blood in your urine increases.  Take over-the-counter and prescription medicines only as told by your health care provider.  Drink enough fluid to keep your urine pale yellow. This information is not intended to replace advice given to you by your health care provider. Make sure you discuss any questions you have with your health care provider. Document Released: 01/14/2013 Document Revised: 02/18/2018 Document Reviewed: 02/19/2018 Elsevier Patient Education  2020 [employer].

## 2019-04-22 ENCOUNTER — Other Ambulatory Visit
Admission: RE | Admit: 2019-04-22 | Discharge: 2019-04-22 | Disposition: A | Discharge status: Home / Self Care | Payer: BC Managed Care – PPO | Source: Ambulatory Visit | Attending: Urology | Admitting: Urology

## 2019-04-22 DIAGNOSIS — Z20828 Contact with and (suspected) exposure to other viral communicable diseases: Secondary | ICD-10-CM | POA: Insufficient documentation

## 2019-04-22 DIAGNOSIS — Z01812 Encounter for preprocedural laboratory examination: Secondary | ICD-10-CM | POA: Diagnosis not present

## 2019-04-22 LAB — SARS CORONAVIRUS 2 (TAT 6-24 HRS): SARS Coronavirus 2: NEGATIVE

## 2019-04-23 MED ORDER — CIPROFLOXACIN IN D5W 400 MG/200ML IV SOLN
400.0000 mg | INTRAVENOUS | Status: AC
  Administered 2019-04-24: 10:00:00 400 mg via INTRAVENOUS

## 2019-04-24 ENCOUNTER — Ambulatory Visit
Admission: RE | Admit: 2019-04-24 | Discharge: 2019-04-24 | Disposition: A | Discharge status: Home / Self Care | Payer: BC Managed Care – PPO | Attending: Urology | Admitting: Urology

## 2019-04-24 ENCOUNTER — Encounter
Admission: RE | Disposition: A | Discharge status: Home / Self Care | Payer: Self-pay | Source: Home / Self Care | Attending: Urology

## 2019-04-24 ENCOUNTER — Ambulatory Visit: Payer: BC Managed Care – PPO | Admitting: Anesthesiology

## 2019-04-24 ENCOUNTER — Encounter: Payer: Self-pay | Admitting: *Deleted

## 2019-04-24 ENCOUNTER — Other Ambulatory Visit: Payer: Self-pay

## 2019-04-24 ENCOUNTER — Ambulatory Visit: Payer: BC Managed Care – PPO

## 2019-04-24 DIAGNOSIS — Z85828 Personal history of other malignant neoplasm of skin: Secondary | ICD-10-CM | POA: Diagnosis not present

## 2019-04-24 DIAGNOSIS — F418 Other specified anxiety disorders: Secondary | ICD-10-CM | POA: Diagnosis not present

## 2019-04-24 DIAGNOSIS — F329 Major depressive disorder, single episode, unspecified: Secondary | ICD-10-CM | POA: Insufficient documentation

## 2019-04-24 DIAGNOSIS — G4730 Sleep apnea, unspecified: Secondary | ICD-10-CM | POA: Diagnosis not present

## 2019-04-24 DIAGNOSIS — N201 Calculus of ureter: Secondary | ICD-10-CM | POA: Diagnosis not present

## 2019-04-24 DIAGNOSIS — Z87891 Personal history of nicotine dependence: Secondary | ICD-10-CM | POA: Insufficient documentation

## 2019-04-24 HISTORY — PX: CYSTOSCOPY/URETEROSCOPY/HOLMIUM LASER/STENT PLACEMENT: SHX6546

## 2019-04-24 SURGERY — CYSTOSCOPY/URETEROSCOPY/HOLMIUM LASER/STENT PLACEMENT
Anesthesia: General | Laterality: Left

## 2019-04-24 MED ORDER — ROCURONIUM BROMIDE 100 MG/10ML IV SOLN
INTRAVENOUS | Status: DC | PRN
  Administered 2019-04-24: 10 mg via INTRAVENOUS
  Administered 2019-04-24: 160 mg via INTRAVENOUS
  Administered 2019-04-24: 40 mg via INTRAVENOUS

## 2019-04-24 MED ORDER — LIDOCAINE HCL (CARDIAC) PF 100 MG/5ML IV SOSY
PREFILLED_SYRINGE | INTRAVENOUS | Status: DC | PRN
  Administered 2019-04-24: 100 mg via INTRAVENOUS

## 2019-04-24 MED ORDER — CIPROFLOXACIN IN D5W 400 MG/200ML IV SOLN
INTRAVENOUS | Status: AC
  Filled 2019-04-24: qty 200

## 2019-04-24 MED ORDER — SCOPOLAMINE 1 MG/3DAYS TD PT72
MEDICATED_PATCH | TRANSDERMAL | Status: AC
  Administered 2019-04-24: 1.5 mg via TRANSDERMAL
  Filled 2019-04-24: qty 1

## 2019-04-24 MED ORDER — MIDAZOLAM HCL 2 MG/2ML IJ SOLN
INTRAMUSCULAR | Status: AC
  Filled 2019-04-24: qty 2

## 2019-04-24 MED ORDER — IOHEXOL 180 MG/ML  SOLN
INTRAMUSCULAR | Status: DC | PRN
  Administered 2019-04-24: 10 mL

## 2019-04-24 MED ORDER — BELLADONNA ALKALOIDS-OPIUM 16.2-60 MG RE SUPP
RECTAL | Status: DC | PRN
  Administered 2019-04-24: 1 via RECTAL

## 2019-04-24 MED ORDER — PHENYLEPHRINE HCL (PRESSORS) 10 MG/ML IV SOLN
INTRAVENOUS | Status: DC | PRN
  Administered 2019-04-24 (×3): 100 ug via INTRAVENOUS

## 2019-04-24 MED ORDER — OXYMETAZOLINE HCL 0.05 % NA SOLN
NASAL | Status: DC | PRN
  Administered 2019-04-24: 3 via NASAL

## 2019-04-24 MED ORDER — KETOROLAC TROMETHAMINE 30 MG/ML IJ SOLN
INTRAMUSCULAR | Status: DC | PRN
  Administered 2019-04-24: 15 mg via INTRAVENOUS

## 2019-04-24 MED ORDER — LACTATED RINGERS IV SOLN
INTRAVENOUS | Status: DC
  Administered 2019-04-24: 08:00:00 via INTRAVENOUS

## 2019-04-24 MED ORDER — SUGAMMADEX SODIUM 500 MG/5ML IV SOLN
INTRAVENOUS | Status: DC | PRN
  Administered 2019-04-24: 149.6 mg via INTRAVENOUS

## 2019-04-24 MED ORDER — BELLADONNA ALKALOIDS-OPIUM 16.2-60 MG RE SUPP
RECTAL | Status: AC
  Filled 2019-04-24: qty 1

## 2019-04-24 MED ORDER — MIDAZOLAM HCL 2 MG/2ML IJ SOLN
INTRAMUSCULAR | Status: DC | PRN
  Administered 2019-04-24: 2 mg via INTRAVENOUS

## 2019-04-24 MED ORDER — FENTANYL CITRATE (PF) 100 MCG/2ML IJ SOLN
INTRAMUSCULAR | Status: DC | PRN
  Administered 2019-04-24 (×2): 50 ug via INTRAVENOUS

## 2019-04-24 MED ORDER — SULFAMETHOXAZOLE-TRIMETHOPRIM 800-160 MG PO TABS: 1.0000 | ORAL_TABLET | Freq: Every day | ORAL | 0 refills | Status: DC

## 2019-04-24 MED ORDER — SUCCINYLCHOLINE CHLORIDE 20 MG/ML IJ SOLN
INTRAMUSCULAR | Status: DC | PRN
  Administered 2019-04-24: 120 mg via INTRAVENOUS

## 2019-04-24 MED ORDER — FENTANYL CITRATE (PF) 100 MCG/2ML IJ SOLN
INTRAMUSCULAR | Status: AC
  Filled 2019-04-24: qty 2

## 2019-04-24 MED ORDER — ONDANSETRON HCL 4 MG/2ML IJ SOLN
INTRAMUSCULAR | Status: DC | PRN
  Administered 2019-04-24: 4 mg via INTRAVENOUS

## 2019-04-24 MED ORDER — DEXAMETHASONE SODIUM PHOSPHATE 10 MG/ML IJ SOLN
INTRAMUSCULAR | Status: DC | PRN
  Administered 2019-04-24: 4 mg via INTRAVENOUS

## 2019-04-24 MED ORDER — SCOPOLAMINE 1 MG/3DAYS TD PT72
1.0000 | MEDICATED_PATCH | Freq: Once | TRANSDERMAL | Status: AC
  Administered 2019-04-24: 08:00:00 1.5 mg via TRANSDERMAL

## 2019-04-24 MED ORDER — OXYBUTYNIN CHLORIDE ER 10 MG PO TB24: 10.0000 mg | ORAL_TABLET | Freq: Every day | ORAL | 0 refills | Status: AC | PRN

## 2019-04-24 SURGICAL SUPPLY — 30 items
BAG DRAIN CYSTO-URO LG1000N (MISCELLANEOUS) ×2 IMPLANT
BRUSH SCRUB EZ 1% IODOPHOR (MISCELLANEOUS) ×2 IMPLANT
CATH URETL 5X70 OPEN END (CATHETERS) IMPLANT
CNTNR SPEC 2.5X3XGRAD LEK (MISCELLANEOUS)
CONT SPEC 4OZ STER OR WHT (MISCELLANEOUS)
CONTAINER SPEC 2.5X3XGRAD LEK (MISCELLANEOUS) IMPLANT
DRAPE UTILITY 15X26 TOWEL STRL (DRAPES) ×2 IMPLANT
FIBER LASER TRAC TIP (UROLOGICAL SUPPLIES) IMPLANT
FIBER LASER TRACTIP 200 (UROLOGICAL SUPPLIES) ×2 IMPLANT
GLOVE BIOGEL PI IND STRL 7.5 (GLOVE) ×1 IMPLANT
GLOVE BIOGEL PI INDICATOR 7.5 (GLOVE) ×1
GOWN STRL REUS W/ TWL LRG LVL3 (GOWN DISPOSABLE) ×1 IMPLANT
GOWN STRL REUS W/ TWL XL LVL3 (GOWN DISPOSABLE) ×1 IMPLANT
GOWN STRL REUS W/TWL LRG LVL3 (GOWN DISPOSABLE) ×1
GOWN STRL REUS W/TWL XL LVL3 (GOWN DISPOSABLE) ×1
GUIDEWIRE STR DUAL SENSOR (WIRE) ×2 IMPLANT
INFUSOR MANOMETER BAG 3000ML (MISCELLANEOUS) ×2 IMPLANT
INTRODUCER DILATOR DOUBLE (INTRODUCER) IMPLANT
KIT TURNOVER CYSTO (KITS) ×2 IMPLANT
PACK CYSTO AR (MISCELLANEOUS) ×2 IMPLANT
SET CYSTO W/LG BORE CLAMP LF (SET/KITS/TRAYS/PACK) ×2 IMPLANT
SHEATH URETERAL 12FRX35CM (MISCELLANEOUS) IMPLANT
SOL .9 NS 3000ML IRR  AL (IV SOLUTION) ×1
SOL .9 NS 3000ML IRR UROMATIC (IV SOLUTION) ×1 IMPLANT
STENT URET 6FRX24 CONTOUR (STENTS) IMPLANT
STENT URET 6FRX26 CONTOUR (STENTS) ×2 IMPLANT
SURGILUBE 2OZ TUBE FLIPTOP (MISCELLANEOUS) ×2 IMPLANT
SYR 10ML LL (SYRINGE) ×2 IMPLANT
VALVE UROSEAL ADJ ENDO (VALVE) IMPLANT
WATER STERILE IRR 1000ML POUR (IV SOLUTION) ×2 IMPLANT

## 2019-04-24 NOTE — H&P (Signed)
UROLOGY H&P UPDATE  Agree with prior H&P dated 04/21/2019.  48 year old female with a noninfected 9 mm left distal ureteral stone and poorly controlled pain, as well as an atrophic right kidney.  She denies any fevers, chills, or dysuria.  She continues to make urine.  Cardiac: RRR Lungs: CTA bilaterally  Laterality: Left Procedure: Left ureteroscopy, laser lithotripsy, stent placement  Urine: Urine culture 11/19 no growth  We specifically discussed the risks ureteroscopy including bleeding, infection/sepsis, stent related symptoms including flank pain/urgency/frequency/incontinence/dysuria, ureteral injury, inability to access stone, or need for staged or additional procedures.   Billey Co, MD 04/24/2019

## 2019-04-24 NOTE — Anesthesia Postprocedure Evaluation (Addendum)
Anesthesia Post Note  Patient: CHARLIZE TACK  Procedure(s) Performed: CYSTOSCOPY/LEFT URETEROSCOPY AND LEFT RETROGRADE PYELOGRAM/HOLMIUM LASER/STENT PLACEMENT (Left )  Patient location during evaluation: PACU Anesthesia Type: General Level of consciousness: awake and alert Pain management: pain level controlled Vital Signs Assessment: post-procedure vital signs reviewed and stable Respiratory status: spontaneous breathing, nonlabored ventilation, respiratory function stable and patient connected to nasal cannula oxygen Cardiovascular status: blood pressure returned to baseline and stable Postop Assessment: no apparent nausea or vomiting Anesthetic complications: no     Last Vitals:  Vitals:   04/24/19 1105 04/24/19 1120  BP: 98/65 103/72  Pulse: (!) 59 61  Resp: 14 16  Temp: (!) 36.4 C (!) 36.3 C  SpO2: 96% 100%    Last Pain:  Vitals:   04/24/19 1120  TempSrc: Temporal  PainSc: 0-No pain                 Precious Haws Jamille Fisher

## 2019-04-24 NOTE — Anesthesia Post-op Follow-up Note (Signed)
Anesthesia QCDR form completed.        

## 2019-04-24 NOTE — Op Note (Signed)
Date of procedure: 04/24/19  Preoperative diagnosis:  1. Left distal 9 mm ureteral stone  Postoperative diagnosis:  1. Same  Procedure: 1. Cystoscopy, left ureteroscopy, laser lithotripsy, left retrograde pyelogram with intraoperative interpretation, left ureteral stent placement  Surgeon: Nickolas Madrid, MD  Anesthesia: General  Complications: None  Intraoperative findings:  1.  Normal cystoscopy, uncomplicated dusting of left distal ureteral stone and stent placement on Dangler  EBL: Minimal  Specimens: None  Drains: Left 6 French by 26 cm ureteral stent  Indication: Meghan Weeks is a 48 y.o. patient with poorly controlled pain secondary to a left 9 mm distal ureteral stone.  After reviewing the management options for treatment, they elected to proceed with the above surgical procedure(s). We have discussed the potential benefits and risks of the procedure, side effects of the proposed treatment, the likelihood of the patient achieving the goals of the procedure, and any potential problems that might occur during the procedure or recuperation. Informed consent has been obtained.  Description of procedure:  The patient was taken to the operating room and general anesthesia was induced. SCDs were placed for DVT prophylaxis. The patient was placed in the dorsal lithotomy position, prepped and draped in the usual sterile fashion, and preoperative antibiotics(Cipro) were administered. A preoperative time-out was performed.   A 21 French rigid cystoscope was used to intubate the meatus and thorough cystoscopy was performed.  The bladder was grossly normal with no mucosal abnormalities, and the ureteral orifices were orthotopic bilaterally.  The stone could be seen on plain film.  A sensor wire was advanced alongside the stone up into the kidney under fluoroscopic vision.  A semirigid ureteroscope was advanced alongside the wire and a yellow stone was identified in the distal ureter.   This was fragmented to dust using a 200 m laser fiber on settings of 1.0 J and 10 Hz.  All pieces were irrigated free from the ureter.  Thorough inspection revealed no residual fragments or ureteral injury.  A retrograde pyelogram was performed through the scope which showed a mildly dilated collecting system but no filling defects or extravasation.  A 6 French by 26 cm ureteral stent was advanced over the wire under fluoroscopic vision and there was an excellent curl in the kidney as well as in the bladder.  The Dangler was secured to the right thigh using a Tegaderm.  Disposition: Stable to PACU  Plan: Patient to self remove stent at home on Tuesday morning Bactrim prophylaxis while stent in place Follow-up in 4 to 6 weeks with renal ultrasound prior to evaluate for silent hydronephrosis in the setting of her known atrophic right kidney  Nickolas Madrid, MD

## 2019-04-24 NOTE — Anesthesia Procedure Notes (Signed)
Procedure Name: Intubation Date/Time: 04/24/2019 9:50 AM Performed by: Nelda Marseille, CRNA Pre-anesthesia Checklist: Patient identified, Patient being monitored, Timeout performed, Emergency Drugs available and Suction available Patient Re-evaluated:Patient Re-evaluated prior to induction Oxygen Delivery Method: Circle system utilized Preoxygenation: Pre-oxygenation with 100% oxygen Induction Type: IV induction Ventilation: Mask ventilation without difficulty Laryngoscope Size: Mac, 3 and McGraph Grade View: Grade I Tube type: Oral Tube size: 7.0 mm Number of attempts: 1 Airway Equipment and Method: Stylet and Video-laryngoscopy Placement Confirmation: ETT inserted through vocal cords under direct vision,  positive ETCO2 and breath sounds checked- equal and bilateral Secured at: 21 cm Tube secured with: Tape Dental Injury: Teeth and Oropharynx as per pre-operative assessment

## 2019-04-24 NOTE — Anesthesia Preprocedure Evaluation (Signed)
Anesthesia Evaluation  Patient identified by MRN, date of birth, ID band Patient awake    Reviewed: Allergy & Precautions, H&P , NPO status , Patient's Chart, lab work & pertinent test results  History of Anesthesia Complications (+) PONV and history of anesthetic complications  Airway Mallampati: III  TM Distance: >3 FB Neck ROM: full    Dental  (+) Chipped, Poor Dentition, Missing   Pulmonary neg shortness of breath, sleep apnea and Continuous Positive Airway Pressure Ventilation , former smoker,           Cardiovascular Exercise Tolerance: Good (-) angina(-) Past MI and (-) DOE      Neuro/Psych PSYCHIATRIC DISORDERS negative neurological ROS     GI/Hepatic negative GI ROS, Neg liver ROS,   Endo/Other  negative endocrine ROS  Renal/GU CRFRenal disease     Musculoskeletal   Abdominal   Peds  Hematology negative hematology ROS (+)   Anesthesia Other Findings Past Medical History: No date: Anxiety No date: Bronchitis No date: Cholelithiasis No date: Complication of anesthesia No date: Depression No date: History of basal cell cancer     Comment:  forehead and nose No date: Neuroma of foot No date: PONV (postoperative nausea and vomiting) No date: Psoriasis 03/2017: Renal stones     Comment:  right  2007 or 2008: Sleep apnea  Past Surgical History: No date: ABDOMINAL HYSTERECTOMY 03/06/2017: BASAL CELL CARCINOMA EXCISION     Comment:  On [facility] No date: CHOLECYSTECTOMY No date: COLONOSCOPY No date: [place]; Right 2013: gastric sleeve surgery     Comment:  with repair of hiatal hernia 03/13/2018: REVISION OF GASTRIC SLEEVE TO BILIOPANCREATIC DIVERSION  WITH DUODENAL SWITCH LAPAROSCOPIC No date: right elbow surgery No date: right wrist surgery  BMI    Body Mass Index: 27.46 kg/m      Reproductive/Obstetrics negative OB ROS                              Anesthesia Physical Anesthesia Plan  ASA: III  Anesthesia Plan: General ETT   Post-op Pain Management:    Induction: Intravenous  PONV Risk Score and Plan: Ondansetron, Dexamethasone, Midazolam and Treatment may vary due to age or medical condition  Airway Management Planned: Oral ETT  Additional Equipment:   Intra-op Plan:   Post-operative Plan: Extubation in OR  Informed Consent: I have reviewed the patients History and Physical, chart, labs and discussed the procedure including the risks, benefits and alternatives for the proposed anesthesia with the patient or authorized representative who has indicated his/her understanding and acceptance.     Dental Advisory Given  Plan Discussed with: Anesthesiologist, CRNA and Surgeon  Anesthesia Plan Comments: (Patient consented for risks of anesthesia including but not limited to:  - adverse reactions to medications - damage to teeth, lips or other oral mucosa - sore throat or hoarseness - Damage to heart, brain, lungs or loss of life  Patient voiced understanding.)        Anesthesia Quick Evaluation

## 2019-04-24 NOTE — Discharge Instructions (Signed)

## 2019-04-24 NOTE — Transfer of Care (Signed)
Immediate Anesthesia Transfer of Care Note  Patient: Meghan Weeks  Procedure(s) Performed: CYSTOSCOPY/LEFT URETEROSCOPY AND LEFT RETROGRADE PYELOGRAM/HOLMIUM LASER/STENT PLACEMENT (Left )  Patient Location: PACU  Anesthesia Type:General  Level of Consciousness: sedated  Airway & Oxygen Therapy: Patient Spontanous Breathing and Patient connected to face mask oxygen  Post-op Assessment: Report given to RN and Post -op Vital signs reviewed and stable  Post vital signs: Reviewed and stable  Last Vitals:  Vitals Value Taken Time  BP 99/62 04/24/19 1021  Temp 35.9 C 04/24/19 1021  Pulse 73 04/24/19 1027  Resp 16 04/24/19 1027  SpO2 96 % 04/24/19 1027    Last Pain:  Vitals:   04/24/19 1027  TempSrc:   PainSc: 0-No pain         Complications: No apparent anesthesia complications

## 2019-04-27 ENCOUNTER — Telehealth: Payer: Self-pay | Admitting: Urology

## 2019-04-27 NOTE — Telephone Encounter (Signed)
Done

## 2019-04-27 NOTE — Telephone Encounter (Signed)
-----   Message from Billey Co, MD sent at 04/24/2019 10:26 AM EST ----- Regarding: follow up Follow up with renal US prior in 6 weeks with me, thanks  Nickolas Madrid, MD 04/24/2019

## 2019-05-16 ENCOUNTER — Other Ambulatory Visit: Payer: Self-pay

## 2019-05-16 ENCOUNTER — Encounter: Payer: Self-pay | Admitting: Emergency Medicine

## 2019-05-16 ENCOUNTER — Ambulatory Visit
Admission: EM | Admit: 2019-05-16 | Discharge: 2019-05-16 | Disposition: A | Discharge status: Home / Self Care | Payer: BC Managed Care – PPO | Attending: Family Medicine | Admitting: Family Medicine

## 2019-05-16 DIAGNOSIS — J0100 Acute maxillary sinusitis, unspecified: Secondary | ICD-10-CM

## 2019-05-16 DIAGNOSIS — Z7189 Other specified counseling: Secondary | ICD-10-CM

## 2019-05-16 MED ORDER — AMOXICILLIN-POT CLAVULANATE 875-125 MG PO TABS: 1.0000 | ORAL_TABLET | Freq: Two times a day (BID) | ORAL | 0 refills | Status: DC

## 2019-05-16 NOTE — ED Provider Notes (Signed)
[facility] ____________________________________________  Time seen: Approximately 11:12 AM  I have reviewed the triage vital signs and the nursing notes.   HISTORY  Chief Complaint Sinus Problem and Cough   HPI Meghan Weeks is a 48 y.o. female presenting for evaluation of continued nasal congestion, nasal drainage, sinus pressure and now with cough.  Patient reports that she has frequent nasal drainage and postnasal drainage, but reports over the last 3 to 4 days she has been having a cough.  States cough is occasionally productive.  Does still have sinus pressure.  Has recurrent seasonal allergies.  Denies fevers.  COVID-19 exposures at work.  Denies home sick contacts.  Denies changes in taste or smell, fevers, chest pain, shortness of breath, vomiting or diarrhea.  Has been using Afrin which helps, no over-the-counter medication for the same thing.  Denies other recent sickness.  Glean Hess, MD : PCP   Past Medical History:  Diagnosis Date  . Anxiety   . Bronchitis   . Cholelithiasis   . Complication of anesthesia   . Depression   . History of basal cell cancer    forehead and nose  . Neuroma of foot   . PONV (postoperative nausea and vomiting)   . Psoriasis   . Renal stones 03/2017   right   . Sleep apnea 2007 or 2008    Patient Active Problem List   Diagnosis Date Noted  . Recurrent major depressive disorder, in partial remission ([place]) 01/21/2019  . Bariatric surgery status 12/16/2017  . Sleep apnea   . Psoriasis   . Neuroma of foot   . History of basal cell cancer   . Depression   . Anxiety   . Chronic pain of right knee 05/18/2016  . Rectocele 06/25/2011    Past Surgical History:  Procedure Laterality Date  . ABDOMINAL HYSTERECTOMY    . BASAL CELL CARCINOMA EXCISION  03/06/2017   On [place]    . COLONOSCOPY    . CYSTOSCOPY/URETEROSCOPY/HOLMIUM LASER/STENT PLACEMENT Left 04/24/2019   Procedure:  CYSTOSCOPY/LEFT URETEROSCOPY AND LEFT RETROGRADE PYELOGRAM/HOLMIUM LASER/STENT PLACEMENT;  Surgeon: Billey Co, MD;  Location: [facility] ORS;  Service: Urology;  Laterality: Left;  . EXCISION MORTON'S NEUROMA Right   . gastric sleeve surgery  2013   with repair of hiatal hernia  . REVISION OF GASTRIC SLEEVE TO BILIOPANCREATIC DIVERSION WITH DUODENAL SWITCH LAPAROSCOPIC  03/13/2018  . right elbow surgery    . right wrist surgery       No current facility-administered medications for this encounter.  Current Outpatient Medications:  .  albuterol (PROAIR HFA) 108 (90 Base) MCG/ACT inhaler, Inhale 2 puffs into the lungs every 6 (six) hours as needed for wheezing or shortness of breath., Disp: 18 g, Rfl: 1 .  buPROPion (WELLBUTRIN XL) 150 MG 24 hr tablet, Take 1 tablet (150 mg total) by mouth 2 (two) times daily., Disp: 180 tablet, Rfl: 1 .  calcium citrate-vitamin D 500-500 MG-UNIT chewable tablet, Chew 1 tablet by mouth 4 (four) times daily., Disp: , Rfl:  .  Multiple Vitamins-Minerals (BARIATRIC MULTIVITAMINS/IRON PO), Take 1 tablet by mouth 2 (two) times daily., Disp: , Rfl:  .  amoxicillin-clavulanate (AUGMENTIN) 875-125 MG tablet, Take 1 tablet by mouth every 12 (twelve) hours., Disp: 20 tablet, Rfl: 0 .  NON FORMULARY, CPAP @@ 10 cm H2O, Disp: , Rfl:  .  tretinoin (RETIN-A) 0.025 % cream, Apply 1 application topically at bedtime as needed (acne). ,  Disp: , Rfl:   Allergies Codeine and Levonorgestrel-ethinyl estrad  Family History  Problem Relation Age of Onset  . Hematuria Mother   . Diabetes Father   . Hypertension Father   . Diabetes Sister   . Hypertension Sister   . Colon cancer Maternal Grandmother   . Uterine cancer Maternal Grandmother   . Kidney cancer Neg Hx   . Bladder Cancer Neg Hx     Social History Social History   Tobacco Use  . Smoking status: Former Smoker    Packs/day: 0.50    Years: 10.00    Pack years: 5.00    Types: Cigarettes    Quit date: 1995     Years since quitting: 25.9  . Smokeless tobacco: Never Used  Substance Use Topics  . Alcohol use: Yes    Comment: social  . Drug use: No    Review of Systems Constitutional: No fever ENT: No sore throat. As above.  Cardiovascular: Denies chest pain. Respiratory: Denies shortness of breath. Gastrointestinal: No abdominal pain.  No nausea, no vomiting.  No diarrhea.   Musculoskeletal: Negative for back pain. Skin: Negative for rash.   ____________________________________________   PHYSICAL EXAM:  VITAL SIGNS: ED Triage Vitals  Enc Vitals Group     BP 05/16/19 1022 109/69     Pulse Rate 05/16/19 1022 70     Resp 05/16/19 1022 14     Temp 05/16/19 1022 98.4 F (36.9 C)     Temp Source 05/16/19 1022 Oral     SpO2 05/16/19 1022 99 %     Weight 05/16/19 1018 163 lb (73.9 kg)     Height 05/16/19 1018 5\' 5"  (1.651 m)     Head Circumference --      Peak Flow --      Pain Score 05/16/19 1018 0     Pain Loc --      Pain Edu? --      Excl. in [place]? --    Constitutional: Alert and oriented. Well appearing and in no acute distress. Eyes: Conjunctivae are normal.  Head: Atraumatic.Mild to moderate tenderness to palpation bilatera maxillary sinuses.  No frontal sinus tenderness palpation.  No swelling. No erythema.   Ears: no erythema, normal TMs bilaterally.   Nose: nasal congestion with bilateral nasal turbinate erythema and edema.   Mouth/Throat: Mucous membranes are moist.  Oropharynx non-erythematous.No tonsillar swelling or exudate.  Neck: No stridor.  No cervical spine tenderness to palpation. Hematological/Lymphatic/Immunilogical: No cervical lymphadenopathy. Cardiovascular: Normal rate, regular rhythm. Grossly normal heart sounds.  Good peripheral circulation. Respiratory: Normal respiratory effort.  No retractions.  No wheezes, rales or rhonchi. Good air movement.  Musculoskeletal: Steady gait. Neurologic:  Normal speech and language.  No gait instability. Skin:  Skin is  warm, dry and intact. No rash noted. Psychiatric: Mood and affect are normal. Speech and behavior are normal. ___________________________________________   LABS (all labs ordered are listed, but only abnormal results are displayed)  Labs Reviewed  NOVEL CORONAVIRUS, NAA (HOSP ORDER, SEND-OUT TO REF LAB; TAT 18-24 HRS)     PROCEDURES Procedures     INITIAL IMPRESSION / ASSESSMENT AND PLAN / ED COURSE  Pertinent labs & imaging results that were available during my care of the patient were reviewed by me and considered in my medical decision making (see chart for details).  Well-appearing patient.  No acute distress.  Will treat muscular sinusitis with oral Augmentin.  Discussed use of over-the-counter Claritin, Zyrtec, mucinex or Flonase and  not using Afrin as frequently.  COVID-19 testing completed and advice given.  Monitor.Discussed indication, risks and benefits of medications with patient.   Discussed follow up with Primary care physician this week. Discussed follow up and return parameters including no resolution or any worsening concerns. Patient verbalized understanding and agreed to plan.   ____________________________________________   FINAL CLINICAL IMPRESSION(S) / ED DIAGNOSES  Final diagnoses:  Acute maxillary sinusitis, recurrence not specified  Advice given about COVID-19 virus infection     ED Discharge Orders         Ordered    amoxicillin-clavulanate (AUGMENTIN) 875-125 MG tablet  Every 12 hours     05/16/19 1055           Note: This dictation was prepared with Dragon dictation along with smaller phrase technology. Any transcriptional errors that result from this process are unintentional.         Marylene Land, NP 05/16/19 1118

## 2019-05-16 NOTE — ED Triage Notes (Signed)
Patient c/o sinus congestion and drainage for the past couple of months. Patient reports a productive cough over the past 2-3 days.  Patient denies fevers.

## 2019-05-16 NOTE — Discharge Instructions (Signed)
Take medication as prescribed. Rest. Drink plenty of fluids.  ° °Follow up with your primary care physician this week as needed. Return to Urgent care for new or worsening concerns.  ° °

## 2019-05-18 ENCOUNTER — Telehealth (HOSPITAL_COMMUNITY): Payer: Self-pay | Admitting: Emergency Medicine

## 2019-05-18 LAB — NOVEL CORONAVIRUS, NAA (HOSP ORDER, SEND-OUT TO REF LAB; TAT 18-24 HRS): SARS-CoV-2, NAA: DETECTED — AB

## 2019-05-18 NOTE — Telephone Encounter (Signed)

## 2019-06-09 ENCOUNTER — Other Ambulatory Visit: Payer: Self-pay

## 2019-06-09 DIAGNOSIS — N201 Calculus of ureter: Secondary | ICD-10-CM

## 2019-06-10 ENCOUNTER — Ambulatory Visit: Payer: BC Managed Care – PPO | Admitting: Urology

## 2019-06-10 ENCOUNTER — Other Ambulatory Visit: Payer: Self-pay | Admitting: Internal Medicine

## 2019-06-10 DIAGNOSIS — J3089 Other allergic rhinitis: Secondary | ICD-10-CM

## 2019-06-10 DIAGNOSIS — J9801 Acute bronchospasm: Secondary | ICD-10-CM

## 2019-06-11 ENCOUNTER — Ambulatory Visit (INDEPENDENT_AMBULATORY_CARE_PROVIDER_SITE_OTHER): Payer: BC Managed Care – PPO

## 2019-06-11 ENCOUNTER — Ambulatory Visit
Admission: EM | Admit: 2019-06-11 | Discharge: 2019-06-11 | Disposition: A | Discharge status: Home / Self Care | Payer: BC Managed Care – PPO | Attending: Internal Medicine | Admitting: Internal Medicine

## 2019-06-11 ENCOUNTER — Other Ambulatory Visit: Payer: Self-pay

## 2019-06-11 ENCOUNTER — Encounter: Payer: Self-pay | Admitting: Emergency Medicine

## 2019-06-11 DIAGNOSIS — R05 Cough: Secondary | ICD-10-CM

## 2019-06-11 DIAGNOSIS — J209 Acute bronchitis, unspecified: Secondary | ICD-10-CM

## 2019-06-11 MED ORDER — AZITHROMYCIN 250 MG PO TABS: ORAL_TABLET | ORAL | 0 refills | Status: DC

## 2019-06-11 MED ORDER — METHYLPREDNISOLONE 4 MG PO TBPK: ORAL_TABLET | ORAL | 0 refills | Status: DC

## 2019-06-11 NOTE — ED Provider Notes (Signed)
[facility]    CSN: [id number] Arrival date & time: 06/11/19  1731      History   Chief Complaint Chief Complaint  Patient presents with  . Cough    HPI Meghan Weeks is a 49 y.o. female. who presents with recurrent cough x 4 days.  She had covid 12/19 and all her symptoms resolved except for the nose congestion which is chronic for her. Four days ago developed SOB and chest pressure and has been using her albuterol inhaler q 6h which helps chest pressure resolve,  but wears off before the 6th hour and symptoms come back. Her cough is non productive. .  Denies fatigue, fever, chills or  sweats    Past Medical History:  Diagnosis Date  . Anxiety   . Bronchitis   . Cholelithiasis   . Complication of anesthesia   . Depression   . History of basal cell cancer    forehead and nose  . Neuroma of foot   . PONV (postoperative nausea and vomiting)   . Psoriasis   . Renal stones 03/2017   right   . Sleep apnea 2007 or 2008    Patient Active Problem List   Diagnosis Date Noted  . Recurrent major depressive disorder, in partial remission ([place]) 01/21/2019  . Bariatric surgery status 12/16/2017  . Sleep apnea   . Psoriasis   . Neuroma of foot   . History of basal cell cancer   . Depression   . Anxiety   . Chronic pain of right knee 05/18/2016  . Rectocele 06/25/2011    Past Surgical History:  Procedure Laterality Date  . ABDOMINAL HYSTERECTOMY    . BASAL CELL CARCINOMA EXCISION  03/06/2017   On [place]    . COLONOSCOPY    . CYSTOSCOPY/URETEROSCOPY/HOLMIUM LASER/STENT PLACEMENT Left 04/24/2019   Procedure: CYSTOSCOPY/LEFT URETEROSCOPY AND LEFT RETROGRADE PYELOGRAM/HOLMIUM LASER/STENT PLACEMENT;  Surgeon: Billey Co, MD;  Location: [facility] ORS;  Service: Urology;  Laterality: Left;  . EXCISION MORTON'S NEUROMA Right   . gastric sleeve surgery  2013   with repair of hiatal hernia  . REVISION OF GASTRIC SLEEVE TO  BILIOPANCREATIC DIVERSION WITH DUODENAL SWITCH LAPAROSCOPIC  03/13/2018  . right elbow surgery    . right wrist surgery      OB History   No obstetric history on file.      Home Medications    Prior to Admission medications   Medication Sig Start Date End Date Taking? Authorizing Provider  albuterol (VENTOLIN HFA) 108 (90 Base) MCG/ACT inhaler TAKE 2 PUFFS BY MOUTH EVERY 6 HOURS AS NEEDED FOR WHEEZE OR SHORTNESS OF BREATH 06/10/19  Yes Glean Hess, MD  buPROPion (WELLBUTRIN XL) 150 MG 24 hr tablet Take 1 tablet (150 mg total) by mouth 2 (two) times daily. 01/21/19  Yes Glean Hess, MD  calcium citrate-vitamin D 500-500 MG-UNIT chewable tablet Chew 1 tablet by mouth 4 (four) times daily.   Yes [provider]  Multiple Vitamins-Minerals (BARIATRIC MULTIVITAMINS/IRON PO) Take 1 tablet by mouth 2 (two) times daily.   Yes [provider]  oxymetazoline (AFRIN) 0.05 % nasal spray Place 1 spray into both nostrils 2 (two) times daily.   Yes [provider]  tretinoin (RETIN-A) 0.025 % cream Apply 1 application topically at bedtime as needed (acne).  10/04/14  Yes [provider]  azithromycin (ZITHROMAX Z-PAK) 250 MG tablet 2 today, then one qd x 4 days 06/11/19  Rodriguez-Southworth, Sunday Spillers, PA-C  methylPREDNISolone (MEDROL DOSEPAK) 4 MG TBPK tablet As directed per pack 06/11/19   Rodriguez-Southworth, Sunday Spillers, PA-C  promethazine (PHENERGAN) 12.5 MG tablet Take 1 tablet (12.5 mg total) by mouth every 6 (six) hours as needed for nausea or vomiting. 04/21/19 05/16/19  Billey Co, MD    Family History Family History  Problem Relation Age of Onset  . Hematuria Mother   . Diabetes Father   . Hypertension Father   . Diabetes Sister   . Hypertension Sister   . Colon cancer Maternal Grandmother   . Uterine cancer Maternal Grandmother   . Kidney cancer Neg Hx   . Bladder Cancer Neg Hx     Social History Social History   Tobacco Use  .  Smoking status: Former Smoker    Packs/day: 0.50    Years: 10.00    Pack years: 5.00    Types: Cigarettes    Quit date: 1995    Years since quitting: 26.0  . Smokeless tobacco: Never Used  Substance Use Topics  . Alcohol use: Yes    Comment: social  . Drug use: No     Allergies   Codeine and Levonorgestrel-ethinyl estrad   Review of Systems Review of Systems  Constitutional: Negative for appetite change, chills, diaphoresis, fatigue and fever.  HENT: Positive for congestion. Negative for postnasal drip, sinus pressure, sore throat and trouble swallowing.   Eyes: Negative for discharge.  Respiratory: Positive for cough and chest tightness. Negative for shortness of breath and wheezing.   Cardiovascular: Negative for chest pain.  Musculoskeletal: Negative for myalgias.  Skin: Negative for rash.  Neurological: Negative for dizziness and headaches.  Hematological: Negative for adenopathy.     Physical Exam Triage Vital Signs ED Triage Vitals  Enc Vitals Group     BP 06/11/19 1804 113/73     Pulse Rate 06/11/19 1804 62     Resp 06/11/19 1804 18     Temp 06/11/19 1804 98.4 F (36.9 C)     Temp Source 06/11/19 1804 Oral     SpO2 06/11/19 1804 100 %     Weight 06/11/19 1800 163 lb (73.9 kg)     Height 06/11/19 1800 5\' 5"  (1.651 m)     Head Circumference --      Peak Flow --      Pain Score 06/11/19 1800 0     Pain Loc --      Pain Edu? --      Excl. in [place]? --    No data found.  Updated Vital Signs BP 113/73   Pulse 62   Temp 98.4 F (36.9 C) (Oral)   Resp 18   Ht 5\' 5"  (1.651 m)   Wt 163 lb (73.9 kg)   SpO2 100%   BMI 27.12 kg/m   Visual Acuity Right Eye Distance:   Left Eye Distance:   Bilateral Distance:    Right Eye Near:   Left Eye Near:    Bilateral Near:     Physical Exam Vitals and nursing note reviewed.  Constitutional:      General: She is not in acute distress.    Appearance: Normal appearance. She is not toxic-appearing.  HENT:      Head: Normocephalic and atraumatic.     Right Ear: Tympanic membrane, ear canal and external ear normal.     Left Ear: Tympanic membrane, ear canal and external ear normal.     Nose: Nose normal. No congestion or rhinorrhea.  Mouth/Throat:     Pharynx: Oropharynx is clear.  Eyes:     General: No scleral icterus.    Conjunctiva/sclera: Conjunctivae normal.  Cardiovascular:     Rate and Rhythm: Normal rate and regular rhythm.     Heart sounds: No murmur.  Pulmonary:     Effort: Pulmonary effort is normal. No respiratory distress.     Breath sounds: Normal breath sounds. No stridor. No wheezing, rhonchi or rales.     Comments: Pressing on her chest when I was testing for chest wall tenderness gave her the sensation to want to cough Chest:     Chest wall: No tenderness.  Musculoskeletal:        General: Normal range of motion.     Cervical back: Neck supple.  Lymphadenopathy:     Cervical: No cervical adenopathy.  Skin:    General: Skin is warm and dry.  Neurological:     Mental Status: She is alert and oriented to person, place, and time.     Gait: Gait normal.  Psychiatric:        Mood and Affect: Mood normal.        Behavior: Behavior normal.        Thought Content: Thought content normal.        Judgment: Judgment normal.      UC Treatments / Results  Labs (all labs ordered are listed, but only abnormal results are displayed) Labs Reviewed - No data to display  EKG   Radiology DG Chest 2 View  Result Date: 06/11/2019 CLINICAL DATA:  Cough. Previously tested positive for COVID mid December. EXAM: CHEST - 2 VIEW COMPARISON:  01/22/2011 FINDINGS: Heart and mediastinal contours are within normal limits. No focal opacities or effusions. No acute bony abnormality. IMPRESSION: Negative. Electronically Signed   By: Rolm Baptise M.D.   On: 06/11/2019 18:31    Procedures Procedures (including critical care time)  Medications Ordered in UC Medications - No data to  display  Initial Impression / Assessment and Plan / UC Course  I have reviewed the triage vital signs and the nursing notes. Pertinent  imaging results that were available during my care of the patient were reviewed by me and considered in my medical decision making (see chart for details). Chest xray was neg. I placed her on Z-pack and Medrol dose pack. Told to use her inhaler q 4h prn.  FU if she gets worse.    Final Clinical Impressions(s) / UC Diagnoses   Final diagnoses:  Acute bronchitis, unspecified organism     Discharge Instructions     Use your inhaler every 4 hour as needed for wheezing.     ED Prescriptions    Medication Sig Dispense Auth. Provider   methylPREDNISolone (MEDROL DOSEPAK) 4 MG TBPK tablet As directed per pack 21 each Rodriguez-Southworth, Ary Lavine, PA-C   azithromycin (ZITHROMAX Z-PAK) 250 MG tablet 2 today, then one qd x 4 days 6 tablet Rodriguez-Southworth, Sunday Spillers, PA-C     PDMP not reviewed this encounter.   Shelby Mattocks, [place] 06/11/19 1855

## 2019-06-11 NOTE — ED Triage Notes (Signed)
Patient tested positive for COVID on 05/16/19. She came off of quarantine on 05/25/19. She states she still has a cough and congestion that has persisted.

## 2019-06-11 NOTE — Discharge Instructions (Signed)
Use your inhaler every 4 hour as needed for wheezing.

## 2019-06-16 ENCOUNTER — Ambulatory Visit: Payer: BC Managed Care – PPO | Attending: Urology

## 2019-06-24 ENCOUNTER — Ambulatory Visit
Admission: RE | Admit: 2019-06-24 | Discharge: 2019-06-24 | Disposition: A | Discharge status: Home / Self Care | Payer: BC Managed Care – PPO | Source: Ambulatory Visit | Attending: Urology | Admitting: Urology

## 2019-06-24 ENCOUNTER — Other Ambulatory Visit: Payer: Self-pay

## 2019-06-24 DIAGNOSIS — N201 Calculus of ureter: Secondary | ICD-10-CM | POA: Insufficient documentation

## 2019-06-24 DIAGNOSIS — N132 Hydronephrosis with renal and ureteral calculous obstruction: Secondary | ICD-10-CM | POA: Diagnosis not present

## 2019-06-25 ENCOUNTER — Ambulatory Visit (INDEPENDENT_AMBULATORY_CARE_PROVIDER_SITE_OTHER): Payer: BC Managed Care – PPO | Admitting: Urology

## 2019-06-25 ENCOUNTER — Encounter: Payer: Self-pay | Admitting: Urology

## 2019-06-25 VITALS — BP 105/70 | HR 64 | Ht 65.0 in | Wt 163.0 lb

## 2019-06-25 DIAGNOSIS — N200 Calculus of kidney: Secondary | ICD-10-CM

## 2019-06-25 NOTE — Progress Notes (Signed)
   06/25/2019 11:29 AM   Consuello Bossier 06-Sep-1970 [id number]  Reason for visit: Follow up renal ultrasound after ureteroscopy  HPI: I saw Ms. Sieloff back in urology clinic for discussion of her renal ultrasound after undergoing ureteroscopy.  She is a 49 year old female with a atrophic right kidney who underwent left ureteroscopy, laser lithotripsy, and stent placement on 04/24/2019 for a 9 mm left distal ureteral stone.  She denies any complaints since that time and specifically denies any flank pain or gross hematuria.  Her baseline creatinine is 0.85, and she had an acute bump to 1.43 with EGFR 43 during her stone episode.  Follow-up renal ultrasound on 06/24/2019 did show some persistent mild left hydronephrosis with no visualized stone.  There is no recent renal function testing.  We reviewed her renal ultrasound results and possible etiologies including resolving hydronephrosis, ureteral stricture, or obstruction.  I recommended renal function evaluation today with BMP.  If renal function is normal would repeat renal ultrasound in 6 weeks to confirm resolution of mild hydronephrosis, however if renal function is abnormal would pursue CT to evaluate for stone fragment or stricture.  BMP today, call with results  A total of 30 minutes were spent face-to-face with the patient, greater than 50% was spent in patient education, counseling, and coordination of care regarding nephrolithiasis, renal ultrasound results, and further evaluation.  Billey Co, [facility] [address] [place], [place] [address] [phone]

## 2019-06-26 ENCOUNTER — Telehealth: Payer: Self-pay

## 2019-06-26 DIAGNOSIS — N200 Calculus of kidney: Secondary | ICD-10-CM

## 2019-06-26 LAB — BASIC METABOLIC PANEL
BUN/Creatinine Ratio: 19 (ref 9–23)
BUN: 18 mg/dL (ref 6–24)
CO2: 23 mmol/L (ref 20–29)
Calcium: 9.5 mg/dL (ref 8.7–10.2)
Chloride: 101 mmol/L (ref 96–106)
Creatinine, Ser: 0.95 mg/dL (ref 0.57–1.00)
GFR calc Af Amer: 81 mL/min/{1.73_m2} (ref >59)
GFR calc non Af Amer: 71 mL/min/{1.73_m2} (ref >59)
Glucose: 87 mg/dL (ref 65–99)
Potassium: 4.4 mmol/L (ref 3.5–5.2)
Sodium: 139 mmol/L (ref 134–144)

## 2019-06-26 NOTE — Telephone Encounter (Signed)
Called pt no answer. Unable to leave message as voicemail is full. [place] ordered. Mychart message sent.

## 2019-06-26 NOTE — Telephone Encounter (Signed)
-----   Message from Billey Co, MD sent at 06/26/2019  2:25 PM EST ----- Good news, kidney function is normal. Please order repeat renal US in 6 weeks to confirm resolution of swelling, will call with [place] results. She should call clinic if she develops left flank pain  Nickolas Madrid, MD 06/26/2019

## 2019-07-16 ENCOUNTER — Other Ambulatory Visit: Payer: Self-pay | Admitting: Internal Medicine

## 2019-07-16 DIAGNOSIS — F3341 Major depressive disorder, recurrent, in partial remission: Secondary | ICD-10-CM

## 2019-08-25 ENCOUNTER — Other Ambulatory Visit: Payer: Self-pay

## 2019-08-25 ENCOUNTER — Ambulatory Visit
Admission: RE | Admit: 2019-08-25 | Discharge: 2019-08-25 | Disposition: A | Discharge status: Home / Self Care | Payer: BC Managed Care – PPO | Source: Ambulatory Visit | Attending: Urology | Admitting: Urology

## 2019-08-25 DIAGNOSIS — N200 Calculus of kidney: Secondary | ICD-10-CM | POA: Diagnosis not present

## 2019-08-25 DIAGNOSIS — N201 Calculus of ureter: Secondary | ICD-10-CM | POA: Diagnosis not present

## 2019-09-11 ENCOUNTER — Telehealth: Payer: Self-pay

## 2019-09-11 DIAGNOSIS — N200 Calculus of kidney: Secondary | ICD-10-CM

## 2019-09-11 NOTE — Telephone Encounter (Signed)
-----   Message from Billey Co, MD sent at 09/11/2019 11:53 AM EDT ----- Renal US looks good and renal function normal on last blood work.   Follow up 1 year with BMP.  Nickolas Madrid, MD 09/11/2019

## 2019-09-11 NOTE — Telephone Encounter (Signed)
Called pt informed her of the information below. Pt gave verbal understanding.  

## 2019-09-15 DIAGNOSIS — Z09 Encounter for follow-up examination after completed treatment for conditions other than malignant neoplasm: Secondary | ICD-10-CM | POA: Diagnosis not present

## 2019-09-15 DIAGNOSIS — Z9884 Bariatric surgery status: Secondary | ICD-10-CM | POA: Diagnosis not present

## 2019-09-15 DIAGNOSIS — Z6827 Body mass index (BMI) 27.0-27.9, adult: Secondary | ICD-10-CM | POA: Diagnosis not present

## 2019-09-15 DIAGNOSIS — G4733 Obstructive sleep apnea (adult) (pediatric): Secondary | ICD-10-CM | POA: Diagnosis not present

## 2019-10-27 ENCOUNTER — Encounter: Payer: Self-pay | Admitting: Emergency Medicine

## 2019-10-27 ENCOUNTER — Other Ambulatory Visit: Payer: Self-pay

## 2019-10-27 ENCOUNTER — Ambulatory Visit
Admission: EM | Admit: 2019-10-27 | Discharge: 2019-10-27 | Disposition: A | Discharge status: Home / Self Care | Payer: BC Managed Care – PPO | Attending: Family Medicine | Admitting: Family Medicine

## 2019-10-27 DIAGNOSIS — J40 Bronchitis, not specified as acute or chronic: Secondary | ICD-10-CM | POA: Insufficient documentation

## 2019-10-27 DIAGNOSIS — Z85828 Personal history of other malignant neoplasm of skin: Secondary | ICD-10-CM | POA: Insufficient documentation

## 2019-10-27 DIAGNOSIS — Z9884 Bariatric surgery status: Secondary | ICD-10-CM | POA: Insufficient documentation

## 2019-10-27 DIAGNOSIS — Z20822 Contact with and (suspected) exposure to covid-19: Secondary | ICD-10-CM | POA: Diagnosis not present

## 2019-10-27 DIAGNOSIS — F419 Anxiety disorder, unspecified: Secondary | ICD-10-CM | POA: Insufficient documentation

## 2019-10-27 DIAGNOSIS — Z79899 Other long term (current) drug therapy: Secondary | ICD-10-CM | POA: Insufficient documentation

## 2019-10-27 DIAGNOSIS — Z87891 Personal history of nicotine dependence: Secondary | ICD-10-CM | POA: Diagnosis not present

## 2019-10-27 DIAGNOSIS — G4730 Sleep apnea, unspecified: Secondary | ICD-10-CM | POA: Insufficient documentation

## 2019-10-27 DIAGNOSIS — J069 Acute upper respiratory infection, unspecified: Secondary | ICD-10-CM | POA: Insufficient documentation

## 2019-10-27 DIAGNOSIS — Z87442 Personal history of urinary calculi: Secondary | ICD-10-CM | POA: Insufficient documentation

## 2019-10-27 DIAGNOSIS — K8020 Calculus of gallbladder without cholecystitis without obstruction: Secondary | ICD-10-CM | POA: Insufficient documentation

## 2019-10-27 NOTE — ED Provider Notes (Signed)
[facility]    CSN: [id number] Arrival date & time: 10/27/19  1127      History   Chief Complaint Chief Complaint  Patient presents with   Nasal Congestion   HPI  49 year old female presents with congestion, runny nose, intermittent cough, postnasal drip.  Patient reports that the above symptoms started 2 days ago.  Her 2 grandchildren are also sick with similar symptoms.  No documented fever.  No relieving factors.  No other associated symptoms.  No other complaints.  Past Medical History:  Diagnosis Date   Anxiety    Bronchitis    Cholelithiasis    Complication of anesthesia    Depression    History of basal cell cancer    forehead and nose   Neuroma of foot    PONV (postoperative nausea and vomiting)    Psoriasis    Renal stones 03/2017   right    Sleep apnea 2007 or 2008    Patient Active Problem List   Diagnosis Date Noted   Recurrent major depressive disorder, in partial remission ([place]) 01/21/2019   Bariatric surgery status 12/16/2017   Sleep apnea    Psoriasis    Neuroma of foot    History of basal cell cancer    Depression    Anxiety    Chronic pain of right knee 05/18/2016   Rectocele 06/25/2011    Past Surgical History:  Procedure Laterality Date   ABDOMINAL HYSTERECTOMY     BASAL CELL CARCINOMA EXCISION  03/06/2017   On [facility]   CHOLECYSTECTOMY     COLONOSCOPY     CYSTOSCOPY/URETEROSCOPY/HOLMIUM LASER/STENT PLACEMENT Left 04/24/2019   Procedure: CYSTOSCOPY/LEFT URETEROSCOPY AND LEFT RETROGRADE PYELOGRAM/HOLMIUM LASER/STENT PLACEMENT;  Surgeon: Billey Co, MD;  Location: [facility] ORS;  Service: Urology;  Laterality: Left;   EXCISION MORTON'S NEUROMA Right    gastric sleeve surgery  2013   with repair of hiatal hernia   REVISION OF GASTRIC SLEEVE TO BILIOPANCREATIC DIVERSION WITH DUODENAL SWITCH LAPAROSCOPIC  03/13/2018   right elbow surgery     right wrist surgery      OB History     No obstetric history on file.      Home Medications    Prior to Admission medications   Medication Sig Start Date End Date Taking? Authorizing Provider  calcium citrate-vitamin D 500-500 MG-UNIT chewable tablet Chew 1 tablet by mouth 4 (four) times daily.   Yes [provider]  Multiple Vitamins-Minerals (BARIATRIC MULTIVITAMINS/IRON PO) Take 1 tablet by mouth 2 (two) times daily.   Yes [provider]  oxymetazoline (AFRIN) 0.05 % nasal spray Place 1 spray into both nostrils 2 (two) times daily.   Yes [provider]  azithromycin (ZITHROMAX Z-PAK) 250 MG tablet 2 today, then one qd x 4 days Patient not taking: Reported on 06/25/2019 06/11/19   Rodriguez-Southworth, Sunday Spillers, PA-C  albuterol (VENTOLIN HFA) 108 (90 Base) MCG/ACT inhaler TAKE 2 PUFFS BY MOUTH EVERY 6 HOURS AS NEEDED FOR WHEEZE OR SHORTNESS OF BREATH 06/10/19 10/27/19  Glean Hess, MD  buPROPion (WELLBUTRIN XL) 150 MG 24 hr tablet TAKE 1 TABLET BY MOUTH TWICE A DAY 07/16/19 10/27/19  Glean Hess, MD  promethazine (PHENERGAN) 12.5 MG tablet Take 1 tablet (12.5 mg total) by mouth every 6 (six) hours as needed for nausea or vomiting. 04/21/19 05/16/19  Billey Co, MD    Family History Family History  Problem Relation Age of Onset   Hematuria Mother  Diabetes Father    Hypertension Father    Diabetes Sister    Hypertension Sister    Colon cancer Maternal Grandmother    Uterine cancer Maternal Grandmother    Kidney cancer Neg Hx    Bladder Cancer Neg Hx     Social History Social History   Tobacco Use   Smoking status: Former Smoker    Packs/day: 0.50    Years: 10.00    Pack years: 5.00    Types: Cigarettes    Quit date: 1995    Years since quitting: 26.4   Smokeless tobacco: Never Used  Substance Use Topics   Alcohol use: Yes    Comment: social   Drug use: No     Allergies   Codeine and Levonorgestrel-ethinyl estrad   Review of Systems Review of  Systems  Constitutional: Negative for fever.  HENT: Positive for congestion, postnasal drip and rhinorrhea.   Respiratory: Positive for cough.    Physical Exam Triage Vital Signs ED Triage Vitals  Enc Vitals Group     BP 10/27/19 1200 112/73     Pulse Rate 10/27/19 1200 70     Resp 10/27/19 1200 18     Temp 10/27/19 1200 98.1 F (36.7 C)     Temp Source 10/27/19 1200 Oral     SpO2 10/27/19 1200 100 %     Weight 10/27/19 1201 165 lb (74.8 kg)     Height 10/27/19 1201 5\' 5"  (1.651 m)     Head Circumference --      Peak Flow --      Pain Score 10/27/19 1201 4     Pain Loc --      Pain Edu? --      Excl. in [place]? --    Updated Vital Signs BP 112/73 (BP Location: Left Arm)    Pulse 70    Temp 98.1 F (36.7 C) (Oral)    Resp 18    Ht 5\' 5"  (1.651 m)    Wt 74.8 kg    SpO2 100%    BMI 27.46 kg/m   Visual Acuity Right Eye Distance:   Left Eye Distance:   Bilateral Distance:    Right Eye Near:   Left Eye Near:    Bilateral Near:     Physical Exam Vitals and nursing note reviewed.  Constitutional:      General: She is not in acute distress.    Appearance: Normal appearance. She is not ill-appearing.  HENT:     Head: Normocephalic and atraumatic.     Right Ear: Tympanic membrane normal.     Left Ear: Tympanic membrane normal.     Mouth/Throat:     Pharynx: Oropharynx is clear. No posterior oropharyngeal erythema.  Eyes:     General:        Right eye: No discharge.        Left eye: No discharge.     Conjunctiva/sclera: Conjunctivae normal.  Cardiovascular:     Rate and Rhythm: Normal rate and regular rhythm.  Pulmonary:     Effort: Pulmonary effort is normal.     Breath sounds: Normal breath sounds. No wheezing, rhonchi or rales.  Neurological:     Mental Status: She is alert.  Psychiatric:        Mood and Affect: Mood normal.        Behavior: Behavior normal.    UC Treatments / Results  Labs (all labs ordered are listed, but only abnormal results are  displayed) Labs Reviewed  SARS CORONAVIRUS 2 (TAT 6-24 HRS)    EKG   Radiology No results found.  Procedures Procedures (including critical care time)  Medications Ordered in UC Medications - No data to display  Initial Impression / Assessment and Plan / UC Course  I have reviewed the triage vital signs and the nursing notes.  Pertinent labs & imaging results that were available during my care of the patient were reviewed by me and considered in my medical decision making (see chart for details).    49 year old female presents with viral URI with cough.  Awaiting Covid test result.  Advised Flonase and Zyrtec.  Supportive care.  Final Clinical Impressions(s) / UC Diagnoses   Final diagnoses:  Viral URI with cough     Discharge Instructions     This is likely viral.  Awaiting COVID test result.  OTC Flonase and Zyrtec (or Zyrtec D).  Take care  Dr. Lacinda Axon     ED Prescriptions    None     PDMP not reviewed this encounter.   Coral Spikes, [place] 10/27/19 1315

## 2019-10-27 NOTE — ED Triage Notes (Signed)
Pt c/o nasal congestion, runny nose, post nasal drainage, cough. Started about 2 days ago. Declines covid testing.

## 2019-10-27 NOTE — Discharge Instructions (Signed)
This is likely viral.  Awaiting COVID test result.  OTC Flonase and Zyrtec (or Zyrtec D).  Take care  Dr. Lacinda Axon

## 2019-10-28 LAB — SARS CORONAVIRUS 2 (TAT 6-24 HRS): SARS Coronavirus 2: NEGATIVE

## 2019-11-19 ENCOUNTER — Other Ambulatory Visit: Payer: Self-pay | Admitting: Internal Medicine

## 2019-11-19 DIAGNOSIS — J3089 Other allergic rhinitis: Secondary | ICD-10-CM

## 2019-11-19 DIAGNOSIS — J9801 Acute bronchospasm: Secondary | ICD-10-CM

## 2020-01-22 ENCOUNTER — Encounter: Payer: BC Managed Care – PPO | Admitting: Internal Medicine

## 2020-03-26 IMAGING — CR DG CHEST 2V
2 series · 2 of 2 positions shown · non-contrast
Comparison: 01/22/2011

CLINICAL DATA: Cough. Previously tested positive for COVID mid
[place].

EXAM:
CHEST - 2 VIEW

[chest pa]
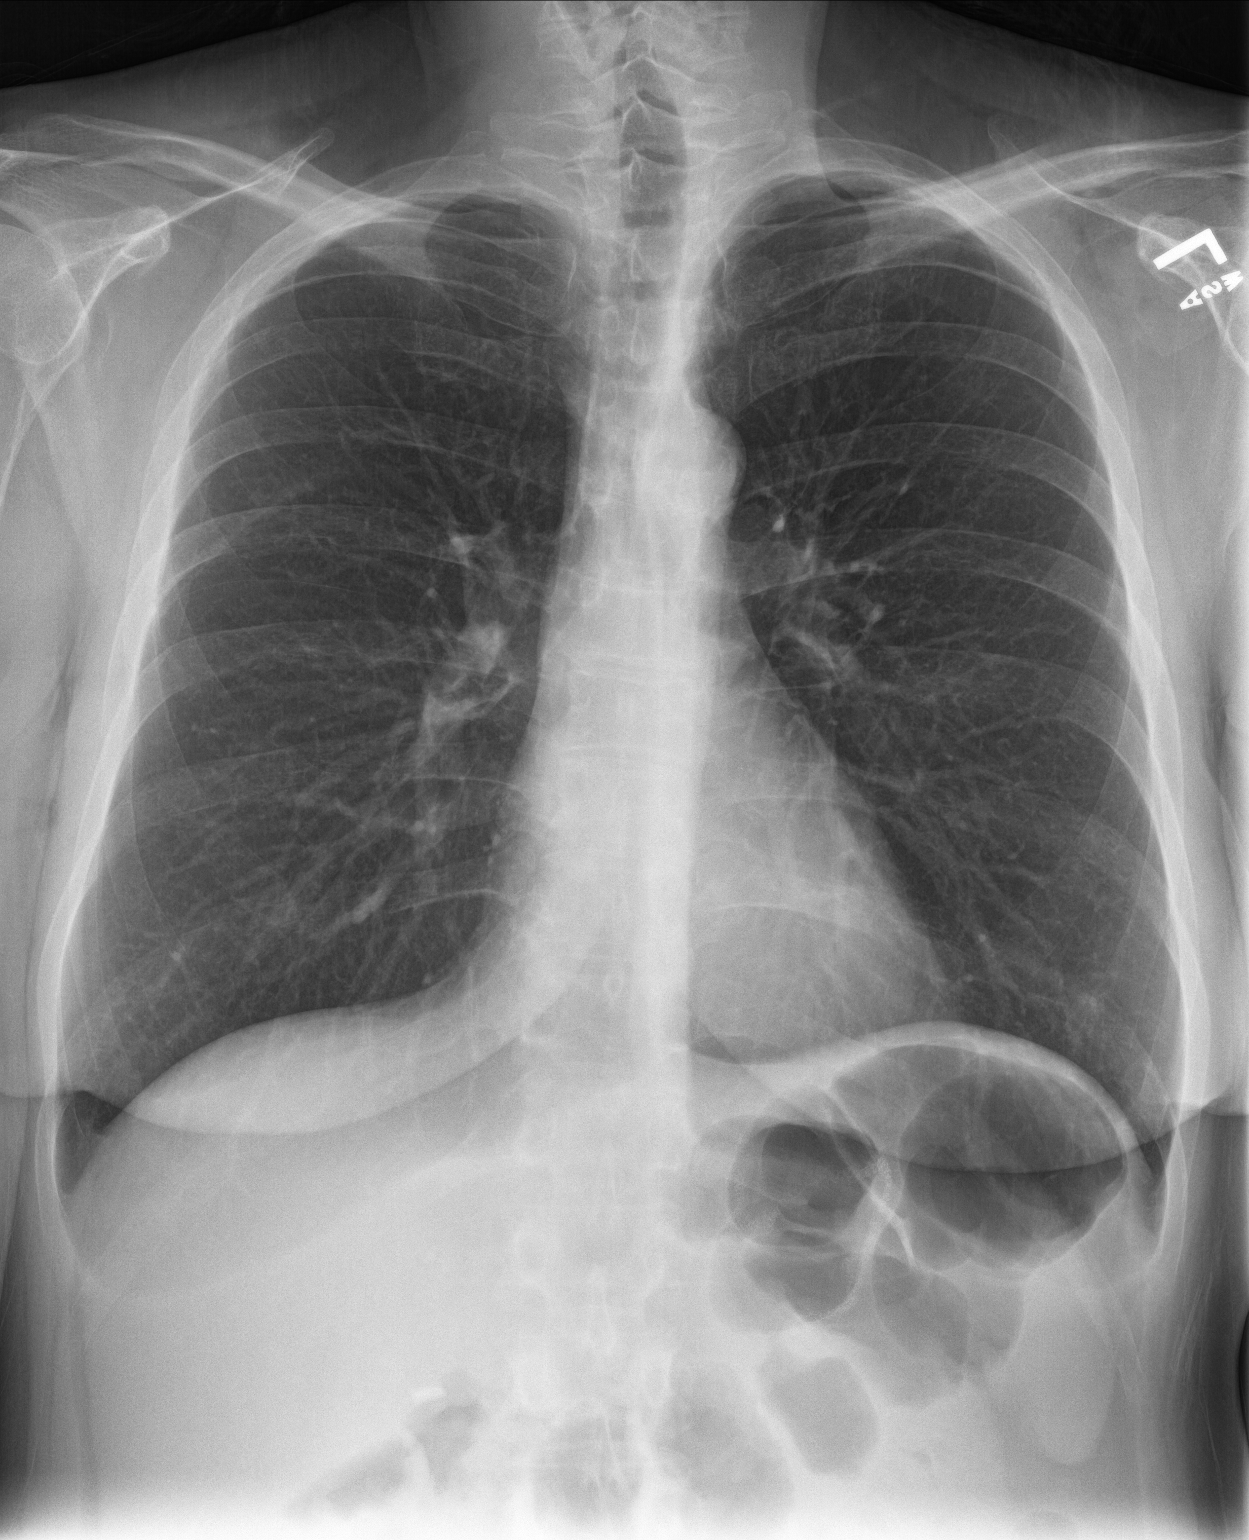

[chest lat]
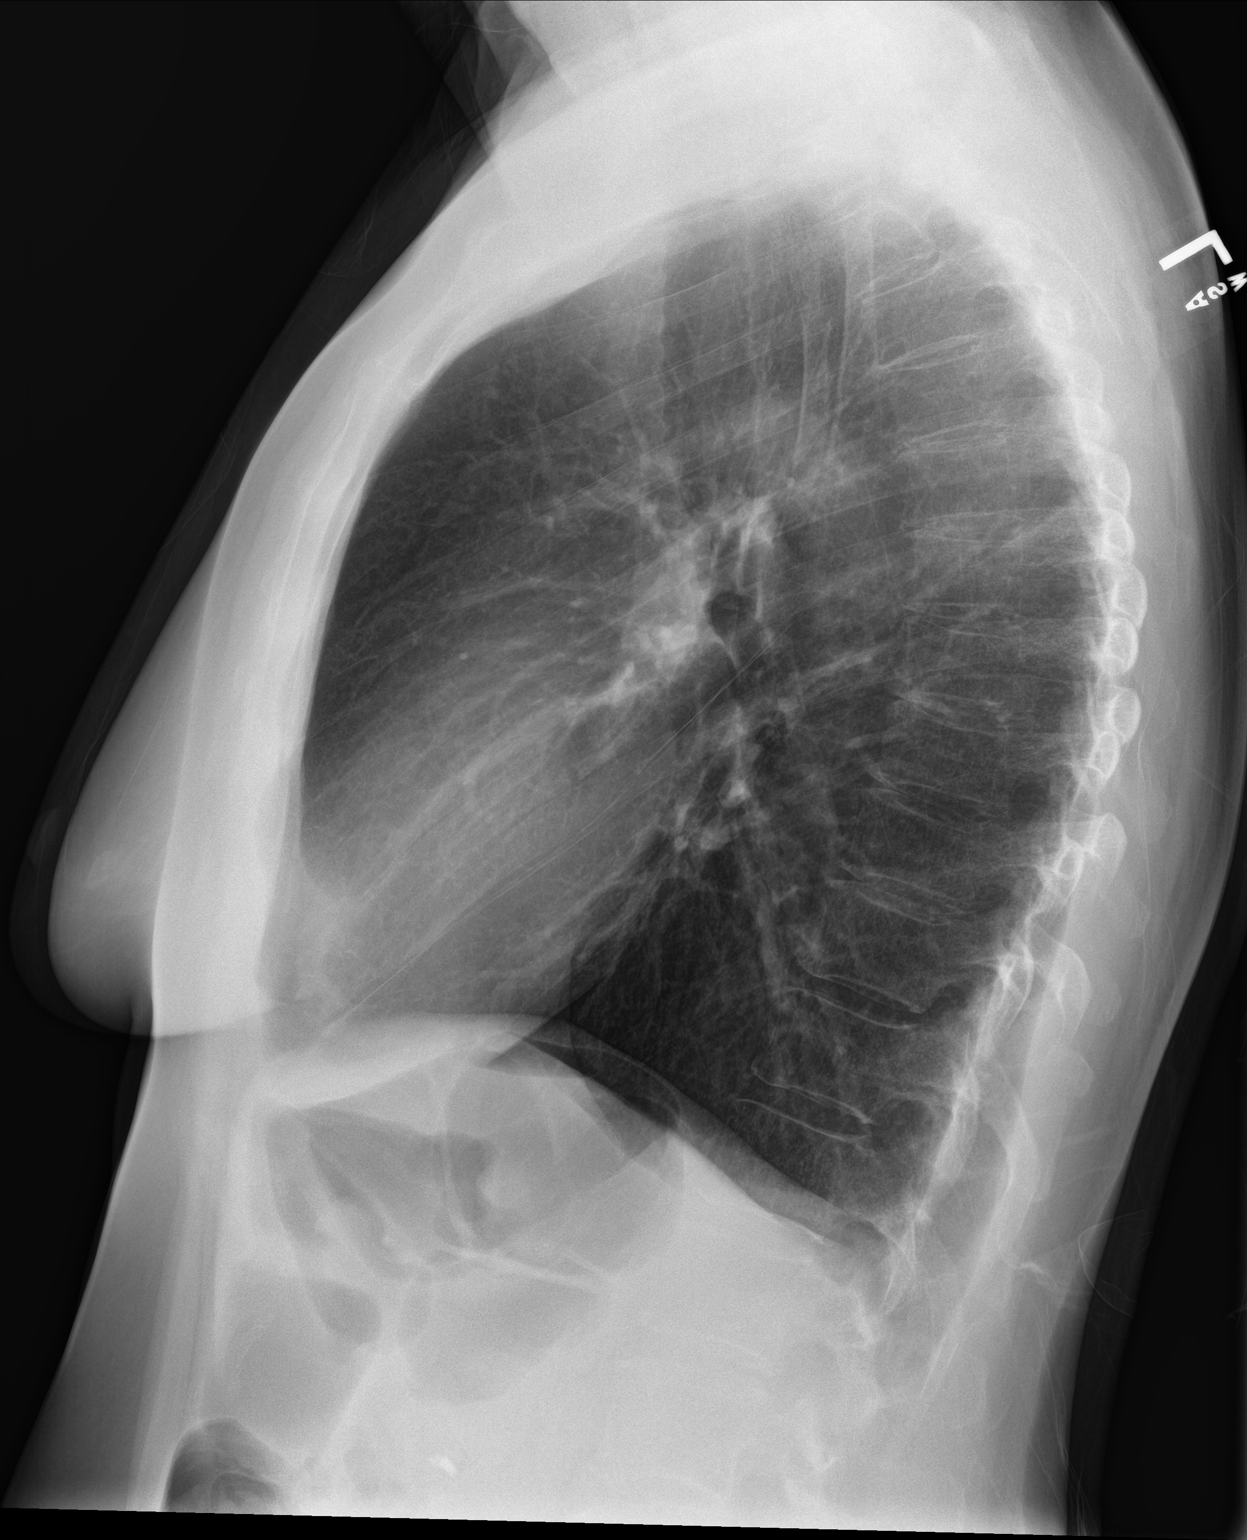

[2 of 2 positions shown; findings below may reference images not displayed]

FINDINGS: Heart and mediastinal contours are within normal limits. No focal
opacities or effusions. No acute bony abnormality.
IMPRESSION: Negative.

## 2020-04-26 ENCOUNTER — Ambulatory Visit: Payer: BC Managed Care – PPO | Admitting: Physician Assistant

## 2020-04-26 ENCOUNTER — Other Ambulatory Visit: Payer: Self-pay

## 2020-04-26 VITALS — BP 103/69 | HR 64 | Ht 65.0 in | Wt 176.0 lb

## 2020-04-26 DIAGNOSIS — R3915 Urgency of urination: Secondary | ICD-10-CM

## 2020-04-26 DIAGNOSIS — R350 Frequency of micturition: Secondary | ICD-10-CM

## 2020-04-26 NOTE — Patient Instructions (Addendum)
I will send your urine for culture today. Please start Azo to control your symptoms. Based on your culture results, we may obtain a CT scan to rule out stone passage.  Please call our office immediately if you develop fever, chills, nausea, vomiting, or worse pain.

## 2020-04-29 LAB — MICROSCOPIC EXAMINATION: RBC, Urine: NONE SEEN /hpf (ref 0–2)

## 2020-04-29 LAB — URINALYSIS, COMPLETE
Bilirubin, UA: NEGATIVE
Glucose, UA: NEGATIVE
Ketones, UA: NEGATIVE
Leukocytes,UA: NEGATIVE
Nitrite, UA: NEGATIVE
Protein,UA: NEGATIVE
RBC, UA: NEGATIVE
Specific Gravity, UA: 1.015 (ref 1.005–1.030)
Urobilinogen, Ur: 0.2 mg/dL (ref 0.2–1.0)
pH, UA: 6 (ref 5.0–7.5)

## 2020-04-29 LAB — CULTURE, URINE COMPREHENSIVE

## 2020-05-02 ENCOUNTER — Telehealth: Payer: Self-pay | Admitting: Physician Assistant

## 2020-05-02 DIAGNOSIS — R109 Unspecified abdominal pain: Secondary | ICD-10-CM

## 2020-05-02 NOTE — Telephone Encounter (Signed)
Please contact the patient and inform her that her recent urine culture has come back negative.  She does not have a UTI.  Based on this, I would like to obtain a renal ultrasound for further evaluation of her right flank pain.  I placed an order for this today.  The imaging department will contact her to schedule this.  I will contact her with her results when I receive them.

## 2020-05-02 NOTE — Progress Notes (Signed)
04/26/2020 1:06 PM   Meghan Weeks [birth year] [id number]  CC: Chief Complaint  Patient presents with  . Flank Pain    HPI: Meghan Weeks is a 49 y.o. female with an atrophic right kidney and nephrolithiasis who presents today for evaluation of possible UTI.   Today she reports an approximate 2-week history of right flank pressure and urinary urgency consistent with past UTIs.  She denies fever, chills, nausea, vomiting, and gross hematuria.  She describes the flank pressure as a dull, aching sensation that is intermittent in nature.  She has not taken any medication at home to treat her symptoms.  In-office UA and microscopy today pan negative.  PMH: Past Medical History:  Diagnosis Date  . Anxiety   . Bronchitis   . Cholelithiasis   . Complication of anesthesia   . Depression   . History of basal cell cancer    forehead and nose  . Neuroma of foot   . PONV (postoperative nausea and vomiting)   . Psoriasis   . Renal stones 03/2017   right   . Sleep apnea 2007 or 2008    Surgical History: Past Surgical History:  Procedure Laterality Date  . ABDOMINAL HYSTERECTOMY    . BASAL CELL CARCINOMA EXCISION  03/06/2017   On [place]    . COLONOSCOPY    . CYSTOSCOPY/URETEROSCOPY/HOLMIUM LASER/STENT PLACEMENT Left 04/24/2019   Procedure: CYSTOSCOPY/LEFT URETEROSCOPY AND LEFT RETROGRADE PYELOGRAM/HOLMIUM LASER/STENT PLACEMENT;  Surgeon: Billey Co, MD;  Location: [facility] ORS;  Service: Urology;  Laterality: Left;  . EXCISION MORTON'S NEUROMA Right   . gastric sleeve surgery  2013   with repair of hiatal hernia  . REVISION OF GASTRIC SLEEVE TO BILIOPANCREATIC DIVERSION WITH DUODENAL SWITCH LAPAROSCOPIC  03/13/2018  . right elbow surgery    . right wrist surgery      Home Medications:  Allergies as of 04/26/2020      Reactions   Codeine Other (See Comments)   In Cough Syrup- Caused Migraines   Levonorgestrel-ethinyl Estrad Other  (See Comments)   Molli Posey) birth control patch "made me sick"      Medication List       Accurate as of April 26, 2020 11:59 PM. If you have any questions, ask your nurse or doctor.        azithromycin 250 MG tablet Commonly known as: Zithromax Z-Pak 2 today, then one qd x 4 days   BARIATRIC MULTIVITAMINS/IRON PO Take 1 tablet by mouth 2 (two) times daily.   calcium citrate-vitamin D 500-500 MG-UNIT chewable tablet Chew 1 tablet by mouth 4 (four) times daily.   CULTURELLE PROBIOTICS PO Take by mouth daily.   oxymetazoline 0.05 % nasal spray Commonly known as: AFRIN Place 1 spray into both nostrils 2 (two) times daily.   TURMERIC CURCUMIN PO Take by mouth daily.       Allergies:  Allergies  Allergen Reactions  . Codeine Other (See Comments)    In Cough Syrup- Caused Migraines  . Levonorgestrel-Ethinyl Estrad Other (See Comments)    Molli Posey) birth control patch "made me sick"    Family History: Family History  Problem Relation Age of Onset  . Hematuria Mother   . Diabetes Father   . Hypertension Father   . Diabetes Sister   . Hypertension Sister   . Colon cancer Maternal Grandmother   . Uterine cancer Maternal Grandmother   . Kidney cancer Neg Hx   . Bladder Cancer Neg Hx  Social History:   reports that she quit smoking about 26 years ago. Her smoking use included cigarettes. She has a 5.00 pack-year smoking history. She has never used smokeless tobacco. She reports current alcohol use. She reports that she does not use drugs.  Physical Exam: BP 103/69 (BP Location: Left Arm, Patient Position: Sitting, Cuff Size: Large)   Pulse 64   Ht 5\' 5"  (1.651 m)   Wt 176 lb (79.8 kg)   BMI 29.29 kg/m   Constitutional:  Alert and oriented, no acute distress, nontoxic appearing HEENT: [place], AT Cardiovascular: No clubbing, cyanosis, or edema Respiratory: Normal respiratory effort, no increased work of breathing Skin: No rashes, bruises or suspicious  lesions Neurologic: Grossly intact, no focal deficits, moving all 4 extremities Psychiatric: Normal mood and affect  Laboratory Data: Results for orders placed or performed in visit on 04/26/20  CULTURE, URINE COMPREHENSIVE   Specimen: Urine   UR  Result Value Ref Range   Urine Culture, Comprehensive Final report    Organism ID, Bacteria Comment   Microscopic Examination   Urine  Result Value Ref Range   WBC, UA 0-5 0 - 5 /hpf   RBC None seen 0 - 2 /hpf   Epithelial Cells (non renal) 0-10 0 - 10 /hpf   Bacteria, UA Few None seen/Few  Urinalysis, Complete  Result Value Ref Range   Specific Gravity, UA 1.015 1.005 - 1.030   pH, UA 6.0 5.0 - 7.5   Color, UA Yellow Yellow   Appearance Ur Clear Clear   Leukocytes,UA Negative Negative   Protein,UA Negative Negative/Trace   Glucose, UA Negative Negative   Ketones, UA Negative Negative   RBC, UA Negative Negative   Bilirubin, UA Negative Negative   Urobilinogen, Ur 0.2 0.2 - 1.0 mg/dL   Nitrite, UA Negative Negative   Microscopic Examination See below:    Assessment & Plan:   1. Urinary urgency Accompanied by right flank pain with a history of nephrolithiasis and benign UA today.  Will send for culture for further evaluation.  If culture is negative, recommend renal ultrasound for evaluation of possible acute stone episode.  Patient expressed understanding. - Urinalysis, Complete - CULTURE, URINE COMPREHENSIVE   Return if symptoms worsen or fail to improve.  Debroah Loop, PA-C  [facility] [address] [place], [place] [address] [phone]

## 2020-05-02 NOTE — Telephone Encounter (Signed)
Notified patient as advised, patient voiced understanding.

## 2020-05-03 ENCOUNTER — Other Ambulatory Visit: Payer: Self-pay

## 2020-05-03 ENCOUNTER — Encounter: Payer: Self-pay | Admitting: Emergency Medicine

## 2020-05-03 ENCOUNTER — Ambulatory Visit
Admission: EM | Admit: 2020-05-03 | Discharge: 2020-05-03 | Disposition: A | Discharge status: Home / Self Care | Payer: BC Managed Care – PPO

## 2020-05-03 DIAGNOSIS — J029 Acute pharyngitis, unspecified: Secondary | ICD-10-CM

## 2020-05-03 DIAGNOSIS — J069 Acute upper respiratory infection, unspecified: Secondary | ICD-10-CM | POA: Diagnosis not present

## 2020-05-03 DIAGNOSIS — R059 Cough, unspecified: Secondary | ICD-10-CM

## 2020-05-03 MED ORDER — PROMETHAZINE-DM 6.25-15 MG/5ML PO SYRP: 5.0000 mL | ORAL_SOLUTION | Freq: Four times a day (QID) | ORAL | 0 refills | Status: AC | PRN

## 2020-05-03 NOTE — ED Provider Notes (Signed)
[facility]    CSN: [id number] Arrival date & time: 05/03/20  1756      History   Chief Complaint Chief Complaint  Patient presents with  . Cough  . Nasal Congestion    HPI Meghan Weeks is a 49 y.o. female presenting with grandchildren for 10 day history of nasal congestion and cough. She also reports sore throat, dry throat and "tickle in the throat." Patient states that her symptoms have not worsened and the cough is gotten a little better. She denies any associated fever or fatigue. She says she has had some sinus pressure without pain. Denies any chest pain or breathing difficulty. She states that the cough is mostly dry. Denies any known COVID-19 exposure. She is not vaccinated for COVID-19. She declines any Covid testing for COVID-19 today. Patient states she has been taking over-the-counter cough/cold medication and using Afrin nasal spray. Denies any history of cardiopulmonary disease. No other complaints or concerns today.  HPI  Past Medical History:  Diagnosis Date  . Anxiety   . Bronchitis   . Cholelithiasis   . Complication of anesthesia   . Depression   . History of basal cell cancer    forehead and nose  . Neuroma of foot   . PONV (postoperative nausea and vomiting)   . Psoriasis   . Renal stones 03/2017   right   . Sleep apnea 2007 or 2008    Patient Active Problem List   Diagnosis Date Noted  . Recurrent major depressive disorder, in partial remission ([place]) 01/21/2019  . Bariatric surgery status 12/16/2017  . Sleep apnea   . Psoriasis   . Neuroma of foot   . History of basal cell cancer   . Depression   . Anxiety   . Chronic pain of right knee 05/18/2016  . Rectocele 06/25/2011    Past Surgical History:  Procedure Laterality Date  . ABDOMINAL HYSTERECTOMY    . BASAL CELL CARCINOMA EXCISION  03/06/2017   On [place]    . COLONOSCOPY    . CYSTOSCOPY/URETEROSCOPY/HOLMIUM LASER/STENT PLACEMENT Left  04/24/2019   Procedure: CYSTOSCOPY/LEFT URETEROSCOPY AND LEFT RETROGRADE PYELOGRAM/HOLMIUM LASER/STENT PLACEMENT;  Surgeon: Billey Co, MD;  Location: [facility] ORS;  Service: Urology;  Laterality: Left;  . EXCISION MORTON'S NEUROMA Right   . gastric sleeve surgery  2013   with repair of hiatal hernia  . REVISION OF GASTRIC SLEEVE TO BILIOPANCREATIC DIVERSION WITH DUODENAL SWITCH LAPAROSCOPIC  03/13/2018  . right elbow surgery    . right wrist surgery      OB History   No obstetric history on file.      Home Medications    Prior to Admission medications   Medication Sig Start Date End Date Taking? Authorizing Provider  albuterol (VENTOLIN HFA) 108 (90 Base) MCG/ACT inhaler Inhale into the lungs every 6 (six) hours as needed for wheezing or shortness of breath.   Yes [provider]  oxymetazoline (AFRIN) 0.05 % nasal spray Place 1 spray into both nostrils 2 (two) times daily.   Yes [provider]  TURMERIC CURCUMIN PO Take by mouth daily.   Yes [provider]  azithromycin (ZITHROMAX Z-PAK) 250 MG tablet 2 today, then one qd x 4 days Patient not taking: Reported on 06/25/2019 06/11/19   Rodriguez-Southworth, Sunday Spillers, PA-C  calcium citrate-vitamin D 500-500 MG-UNIT chewable tablet Chew 1 tablet by mouth 4 (four) times daily.    [provider]  Multiple Vitamins-Minerals (  BARIATRIC MULTIVITAMINS/IRON PO) Take 1 tablet by mouth 2 (two) times daily.    [provider]  Probiotic Product (CULTURELLE PROBIOTICS PO) Take by mouth daily.    [provider]  promethazine-dextromethorphan (PROMETHAZINE-DM) 6.25-15 MG/5ML syrup Take 5 mLs by mouth 4 (four) times daily as needed for up to 10 days for cough. 05/03/20 05/13/20  Danton Clap, PA-C  buPROPion (WELLBUTRIN XL) 150 MG 24 hr tablet TAKE 1 TABLET BY MOUTH TWICE A DAY 07/16/19 10/27/19  Glean Hess, MD  promethazine (PHENERGAN) 12.5 MG tablet Take 1 tablet (12.5 mg total) by mouth  every 6 (six) hours as needed for nausea or vomiting. 04/21/19 05/16/19  Billey Co, MD    Family History Family History  Problem Relation Age of Onset  . Hematuria Mother   . Diabetes Father   . Hypertension Father   . Diabetes Sister   . Hypertension Sister   . Colon cancer Maternal Grandmother   . Uterine cancer Maternal Grandmother   . Kidney cancer Neg Hx   . Bladder Cancer Neg Hx     Social History Social History   Tobacco Use  . Smoking status: Former Smoker    Packs/day: 0.50    Years: 10.00    Pack years: 5.00    Types: Cigarettes    Quit date: 1995    Years since quitting: 26.9  . Smokeless tobacco: Never Used  Vaping Use  . Vaping Use: Never used  Substance Use Topics  . Alcohol use: Yes    Comment: social  . Drug use: No     Allergies   Codeine and Levonorgestrel-ethinyl estrad   Review of Systems Review of Systems  Constitutional: Negative for chills, diaphoresis, fatigue and fever.  HENT: Positive for congestion, rhinorrhea, sinus pressure and sore throat. Negative for ear pain and sinus pain.   Respiratory: Positive for cough. Negative for shortness of breath.   Gastrointestinal: Negative for abdominal pain, nausea and vomiting.  Musculoskeletal: Negative for arthralgias and myalgias.  Skin: Negative for rash.  Neurological: Negative for weakness and headaches.  Hematological: Negative for adenopathy.     Physical Exam Triage Vital Signs ED Triage Vitals  Enc Vitals Group     BP 05/03/20 1817 119/71     Pulse Rate 05/03/20 1817 73     Resp 05/03/20 1817 18     Temp 05/03/20 1817 98.2 F (36.8 C)     Temp Source 05/03/20 1817 Oral     SpO2 05/03/20 1817 100 %     Weight 05/03/20 1816 170 lb (77.1 kg)     Height 05/03/20 1816 5\' 5"  (1.651 m)     Head Circumference --      Peak Flow --      Pain Score 05/03/20 1815 0     Pain Loc --      Pain Edu? --      Excl. in [place]? --    No data found.  Updated Vital Signs BP 119/71  (BP Location: Right Arm)   Pulse 73   Temp 98.2 F (36.8 C) (Oral)   Resp 18   Ht 5\' 5"  (1.651 m)   Wt 170 lb (77.1 kg)   SpO2 100%   BMI 28.29 kg/m       Physical Exam Vitals and nursing note reviewed.  Constitutional:      General: She is not in acute distress.    Appearance: Normal appearance. She is not ill-appearing or toxic-appearing.  HENT:     Head: Normocephalic and atraumatic.     Right Ear: Tympanic membrane, ear canal and external ear normal.     Left Ear: Tympanic membrane, ear canal and external ear normal.     Nose: Congestion and rhinorrhea present.     Mouth/Throat:     Mouth: Mucous membranes are moist.     Pharynx: Oropharynx is clear. Posterior oropharyngeal erythema (with clear PND) present.  Eyes:     General: No scleral icterus.       Right eye: No discharge.        Left eye: No discharge.     Conjunctiva/sclera: Conjunctivae normal.  Cardiovascular:     Rate and Rhythm: Normal rate and regular rhythm.     Heart sounds: Normal heart sounds.  Pulmonary:     Effort: Pulmonary effort is normal. No respiratory distress.     Breath sounds: Normal breath sounds. No wheezing, rhonchi or rales.  Musculoskeletal:     Cervical back: Neck supple.  Skin:    General: Skin is dry.  Neurological:     General: No focal deficit present.     Mental Status: She is alert. Mental status is at baseline.     Motor: No weakness.     Gait: Gait normal.  Psychiatric:        Mood and Affect: Mood normal.        Behavior: Behavior normal.        Thought Content: Thought content normal.      UC Treatments / Results  Labs (all labs ordered are listed, but only abnormal results are displayed) Labs Reviewed - No data to display  EKG   Radiology No results found.  Procedures Procedures (including critical care time)  Medications Ordered in UC Medications - No data to display  Initial Impression / Assessment and Plan / UC Course  I have reviewed the triage  vital signs and the nursing notes.  Pertinent labs & imaging results that were available during my care of the patient were reviewed by me and considered in my medical decision making (see chart for details).   49 year old female with 10-day history of improving cough, nasal congestion and sore throat. Suspect viral illness. Advised she should feel better in the next week or two. Advised continuing supportive care with increasing rest and fluids. I have sent Promethazine DM cough syrup. Advised to follow-up as needed for any worsening symptoms including fever, worsening cough, sinus pain, or breathing difficulty. ED precautions reviewed.   Final Clinical Impressions(s) / UC Diagnoses   Final diagnoses:  Viral upper respiratory tract infection  Cough  Sore throat   Discharge Instructions   None    ED Prescriptions    Medication Sig Dispense Auth. Provider   promethazine-dextromethorphan (PROMETHAZINE-DM) 6.25-15 MG/5ML syrup Take 5 mLs by mouth 4 (four) times daily as needed for up to 10 days for cough. 150 mL Danton Clap, PA-C     PDMP not reviewed this encounter.   Danton Clap, PA-C 05/03/20 1915

## 2020-05-03 NOTE — ED Triage Notes (Addendum)
Patient c/o nasal congestion and cough that started 10 days ago. Denies fever. Patient denies COVID testing.

## 2020-05-05 ENCOUNTER — Ambulatory Visit: Payer: BC Managed Care – PPO

## 2020-05-22 ENCOUNTER — Encounter: Payer: Self-pay | Admitting: Internal Medicine

## 2020-05-22 DIAGNOSIS — Z90722 Acquired absence of ovaries, bilateral: Secondary | ICD-10-CM | POA: Insufficient documentation

## 2020-05-22 DIAGNOSIS — Z90710 Acquired absence of both cervix and uterus: Secondary | ICD-10-CM | POA: Insufficient documentation

## 2020-05-23 ENCOUNTER — Ambulatory Visit (INDEPENDENT_AMBULATORY_CARE_PROVIDER_SITE_OTHER): Payer: BC Managed Care – PPO | Admitting: Internal Medicine

## 2020-05-23 ENCOUNTER — Encounter: Payer: Self-pay | Admitting: Internal Medicine

## 2020-05-23 ENCOUNTER — Other Ambulatory Visit: Payer: Self-pay

## 2020-05-23 VITALS — BP 106/68 | HR 73 | Temp 97.9°F | Ht 65.0 in | Wt 178.0 lb

## 2020-05-23 DIAGNOSIS — F3341 Major depressive disorder, recurrent, in partial remission: Secondary | ICD-10-CM | POA: Diagnosis not present

## 2020-05-23 DIAGNOSIS — Z9884 Bariatric surgery status: Secondary | ICD-10-CM | POA: Diagnosis not present

## 2020-05-23 DIAGNOSIS — Z1211 Encounter for screening for malignant neoplasm of colon: Secondary | ICD-10-CM | POA: Diagnosis not present

## 2020-05-23 DIAGNOSIS — Z1159 Encounter for screening for other viral diseases: Secondary | ICD-10-CM | POA: Diagnosis not present

## 2020-05-23 DIAGNOSIS — Z0000 Encounter for general adult medical examination without abnormal findings: Secondary | ICD-10-CM

## 2020-05-23 DIAGNOSIS — Z1231 Encounter for screening mammogram for malignant neoplasm of breast: Secondary | ICD-10-CM | POA: Diagnosis not present

## 2020-05-23 DIAGNOSIS — G4733 Obstructive sleep apnea (adult) (pediatric): Secondary | ICD-10-CM

## 2020-05-23 LAB — POCT URINALYSIS DIPSTICK
Bilirubin, UA: NEGATIVE
Blood, UA: NEGATIVE
Glucose, UA: NEGATIVE
Ketones, UA: NEGATIVE
Leukocytes, UA: NEGATIVE
Nitrite, UA: NEGATIVE
Protein, UA: NEGATIVE
Spec Grav, UA: 1.025 (ref 1.010–1.025)
Urobilinogen, UA: 0.2 E.U./dL
pH, UA: 6 (ref 5.0–8.0)

## 2020-05-23 MED ORDER — ALBUTEROL SULFATE HFA 108 (90 BASE) MCG/ACT IN AERS: 2.0000 | INHALATION_SPRAY | Freq: Four times a day (QID) | RESPIRATORY_TRACT | 2 refills | Status: DC | PRN

## 2020-05-23 MED ORDER — SERTRALINE HCL 100 MG PO TABS: 100.0000 mg | ORAL_TABLET | Freq: Every day | ORAL | 1 refills | Status: DC

## 2020-05-23 NOTE — Progress Notes (Signed)
Date:  05/23/2020   Name:  Meghan Weeks   DOB:  [birth year]   MRN:  [id number]   Chief Complaint: Annual Exam (Breast exam no pap) Meghan Weeks is a 49 y.o. female who presents today for her Complete Annual Exam. She feels well. She reports exercising none. She reports she is sleeping well. Breast complaints none.  Mammogram: none Pap smear: discontinued Colonoscopy: none   There is no immunization history on file for this patient.   Depression        This is a recurrent problem.The problem is unchanged.  Associated symptoms include irritable and decreased interest.  Associated symptoms include no fatigue, no headaches and no suicidal ideas.     The symptoms are aggravated by family issues.  Past treatments include SSRIs - Selective serotonin reuptake inhibitors and other medications. OSA - on CPAP nightly.  Sleeps well, no daytime somnolence, no headaches.  Plans to have a follow up with sleep Med in the near future.   Lab Results  Component Value Date   CREATININE 0.95 06/25/2019   BUN 18 06/25/2019   NA 139 06/25/2019   K 4.4 06/25/2019   CL 101 06/25/2019   CO2 23 06/25/2019   Lab Results  Component Value Date   CHOL 201 (H) 05/03/2017   HDL 71 05/03/2017   LDLCALC 117 (H) 05/03/2017   TRIG 67 05/03/2017   CHOLHDL 2.8 05/03/2017   Lab Results  Component Value Date   TSH 0.952 05/03/2017   Lab Results  Component Value Date   HGBA1C 5.1 05/03/2017   Lab Results  Component Value Date   WBC 8.4 04/18/2019   HGB 12.5 04/18/2019   HCT 38.2 04/18/2019   MCV 92.0 04/18/2019   PLT 301 04/18/2019   Lab Results  Component Value Date   ALT 46 (H) 04/18/2019   AST 27 04/18/2019   ALKPHOS 82 04/18/2019   BILITOT 0.7 04/18/2019     Review of Systems  Constitutional: Negative for chills, fatigue and fever.  HENT: Positive for congestion (nasal congestion). Negative for hearing loss, tinnitus, trouble swallowing and voice change.   Eyes: Negative for  visual disturbance.  Respiratory: Negative for cough, chest tightness, shortness of breath and wheezing.   Cardiovascular: Negative for chest pain, palpitations and leg swelling.  Gastrointestinal: Negative for abdominal pain, constipation, diarrhea and vomiting.  Endocrine: Negative for polydipsia and polyuria.  Genitourinary: Negative for dysuria, frequency, genital sores, vaginal bleeding and vaginal discharge.  Musculoskeletal: Negative for arthralgias, gait problem and joint swelling.  Skin: Negative for color change and rash.  Neurological: Negative for dizziness, tremors, light-headedness and headaches.  Hematological: Negative for adenopathy. Does not bruise/bleed easily.  Psychiatric/Behavioral: Positive for depression, dysphoric mood and self-injury. Negative for sleep disturbance and suicidal ideas. The patient is not nervous/anxious.     Patient Active Problem List   Diagnosis Date Noted   S/P TAH-BSO 05/22/2020   Recurrent major depressive disorder, in partial remission (Meghan Weeks) 01/21/2019   Bariatric surgery status 12/16/2017   Sleep apnea    Psoriasis    Neuroma of foot    History of basal cell cancer    Depression    Anxiety    Chronic pain of right knee 05/18/2016   Rectocele 06/25/2011    Allergies  Allergen Reactions   Codeine Other (See Comments)    In Cough Syrup- Caused Migraines   Levonorgestrel-Ethinyl Estrad Other (See Comments)    Molli Posey) birth control patch "made me sick"  Past Surgical History:  Procedure Laterality Date   ABDOMINAL HYSTERECTOMY     BASAL CELL [facility]CINOMA EXCISION  [facility]   CHOLECYSTECTOMY     COLONOSCOPY     CYSTOSCOPY/URETEROSCOPY/HOLMIUM LASER/STENT PLACEMENT Left 04/24/2019   Procedure: CYSTOSCOPY/LEFT URETEROSCOPY AND LEFT RETROGRADE PYELOGRAM/HOLMIUM LASER/STENT PLACEMENT;  Surgeon: Sondra Come, MD;  Location: [facility] ORS;  Service: Urology;  Laterality: Left;   EXCISION  MORTON'S NEUROMA Right    gastric sleeve surgery  2013   with repair of hiatal hernia   REVISION OF GASTRIC SLEEVE TO BILIOPANCREATIC DIVERSION WITH DUODENAL SWITCH LAPAROSCOPIC  03/13/2018   right elbow surgery     right wrist surgery      Social History   Tobacco Use   Smoking status: Former Smoker    Packs/day: 0.50    Years: 10.00    Pack years: 5.00    Types: Cigarettes    Quit date: 1995    Years since quitting: 27.0   Smokeless tobacco: Never Used  Building services engineer Use: Never used  Substance Use Topics   Alcohol use: Yes    Comment: social   Drug use: No     Medication list has been reviewed and updated.  Current Meds  Medication Sig   albuterol (VENTOLIN HFA) 108 (90 Base) MCG/ACT inhaler Inhale into the lungs every 6 (six) hours as needed for wheezing or shortness of breath.   calcium citrate-vitamin D 500-500 MG-UNIT chewable tablet Chew 1 tablet by mouth 4 (four) times daily.   Multiple Vitamins-Minerals (BARIATRIC MULTIVITAMINS/IRON PO) Take 1 tablet by mouth 2 (two) times daily.   oxymetazoline (AFRIN) 0.05 % nasal spray Place 1 spray into both nostrils 2 (two) times daily.   Probiotic Product (CULTURELLE PROBIOTICS PO) Take by mouth daily.   TURMERIC CURCUMIN PO Take by mouth daily.    PHQ 2/9 Scores 05/23/2020 01/21/2019 09/19/2018 04/10/2018  PHQ - 2 Score 6 4 0 0  PHQ- 9 Score 8 8 0 1    GAD 7 : Generalized Anxiety Score 05/23/2020 01/21/2019  Nervous, Anxious, on Edge 3 3  Control/stop worrying 3 3  Worry too much - different things 3 2  Trouble relaxing 3 3  Restless 0 2  Easily annoyed or irritable 3 3  Afraid - awful might happen 2 3  Total GAD 7 Score 17 19  Anxiety Difficulty Not difficult at all Somewhat difficult    BP Readings from Last 3 Encounters:  05/23/20 106/68  05/03/20 119/71  04/26/20 103/69    Physical Exam Vitals and nursing note reviewed.  Constitutional:      General: She is irritable. She is not  in acute distress.    Appearance: She is well-developed.  HENT:     Head: Normocephalic and atraumatic.     Right Ear: Tympanic membrane and ear canal normal.     Left Ear: Tympanic membrane and ear canal normal.     Nose:     Right Sinus: No maxillary sinus tenderness.     Left Sinus: No maxillary sinus tenderness.  Eyes:     General: No scleral icterus.       Right eye: No discharge.        Left eye: No discharge.     Conjunctiva/sclera: Conjunctivae normal.  Neck:     Thyroid: No thyromegaly.     Vascular: No carotid bruit.  Cardiovascular:     Rate and Rhythm: Normal rate and regular rhythm.  Pulses: Normal pulses.     Heart sounds: Normal heart sounds.  Pulmonary:     Effort: Pulmonary effort is normal. No respiratory distress.     Breath sounds: No wheezing.  Chest:  Breasts:     Right: No mass, nipple discharge, skin change or tenderness.     Left: No mass, nipple discharge, skin change or tenderness.    Abdominal:     General: Bowel sounds are normal.     Palpations: Abdomen is soft.     Tenderness: There is no abdominal tenderness.  Musculoskeletal:     Cervical back: Normal range of motion. No erythema.     Right lower leg: No edema.     Left lower leg: No edema.  Lymphadenopathy:     Cervical: No cervical adenopathy.  Skin:    General: Skin is warm and dry.     Findings: No rash.  Neurological:     Mental Status: She is alert and oriented to person, place, and time.     Cranial Nerves: No cranial nerve deficit.     Sensory: No sensory deficit.     Deep Tendon Reflexes: Reflexes are normal and symmetric.  Psychiatric:        Attention and Perception: Attention normal.        Mood and Affect: Mood normal.        Thought Content: Thought content includes suicidal ideation. Thought content includes suicidal plan.     Wt Readings from Last 3 Encounters:  05/23/20 178 lb (80.7 kg)  05/03/20 170 lb (77.1 kg)  04/26/20 176 lb (79.8 kg)    BP 106/68     Pulse 73    Temp 97.9 F (36.6 C) (Oral)    Ht 5\' 5"  (1.651 m)    Wt 178 lb (80.7 kg)    SpO2 100%    BMI 29.62 kg/m   Assessment and Plan: 1. Annual physical exam Normal exam; recommend regular exercise, continue healthy diet and weight control - CBC with Differential/Platelet - Comprehensive metabolic panel - Lipid panel - TSH - POCT urinalysis dipstick  2. Encounter for screening mammogram for breast cancer Schedule at [facility] - MM 3D SCREEN BREAST BILATERAL; Future  3. Colon cancer screening referred - Ambulatory referral to Gastroenterology  4. Need for hepatitis C screening test - Hepatitis C antibody  5. Recurrent major depressive disorder, in partial remission (HCC) Depression and anxiety symptoms are recurrent; recommend resume Sertraline 50 mg daily; can increase to 100 mg after several weeks if needed Follow up in 2 months and PRN Consider adding bupropion at next visit - sertraline (ZOLOFT) 100 MG tablet; Take 1 tablet (100 mg total) by mouth daily.  Dispense: 30 tablet; Refill: 1  6. Bariatric surgery status Doing well without dumping, weight gain, hypoglycemic episodes  7. Obstructive sleep apnea syndrome On CPAP nightly   Partially dictated using [facility]. Any errors are unintentional.  Animal nutritionist, MD [facility]  05/23/2020

## 2020-05-24 ENCOUNTER — Telehealth (INDEPENDENT_AMBULATORY_CARE_PROVIDER_SITE_OTHER): Payer: Self-pay | Admitting: Gastroenterology

## 2020-05-24 ENCOUNTER — Other Ambulatory Visit: Payer: Self-pay

## 2020-05-24 DIAGNOSIS — Z1211 Encounter for screening for malignant neoplasm of colon: Secondary | ICD-10-CM

## 2020-05-24 LAB — CBC WITH DIFFERENTIAL/PLATELET
Basophils Absolute: 0 10*3/uL (ref 0.0–0.2)
Basos: 1 %
EOS (ABSOLUTE): 0.1 10*3/uL (ref 0.0–0.4)
Eos: 1 %
Hematocrit: 38.4 % (ref 34.0–46.6)
Hemoglobin: 12.9 g/dL (ref 11.1–15.9)
Immature Grans (Abs): 0 10*3/uL (ref 0.0–0.1)
Immature Granulocytes: 0 %
Lymphocytes Absolute: 1.6 10*3/uL (ref 0.7–3.1)
Lymphs: 30 %
MCH: 30.8 pg (ref 26.6–33.0)
MCHC: 33.6 g/dL (ref 31.5–35.7)
MCV: 92 fL (ref 79–97)
Monocytes Absolute: 0.4 10*3/uL (ref 0.1–0.9)
Monocytes: 8 %
Neutrophils Absolute: 3.2 10*3/uL (ref 1.4–7.0)
Neutrophils: 60 %
Platelets: 332 10*3/uL (ref 150–450)
RBC: 4.19 x10E6/uL (ref 3.77–5.28)
RDW: 11.9 % (ref 11.7–15.4)
WBC: 5.4 10*3/uL (ref 3.4–10.8)

## 2020-05-24 LAB — LIPID PANEL
Chol/HDL Ratio: 2.9 ratio (ref 0.0–4.4)
Cholesterol, Total: 157 mg/dL (ref 100–199)
HDL: 55 mg/dL (ref >39)
LDL Chol Calc (NIH): 77 mg/dL (ref 0–99)
Triglycerides: 148 mg/dL (ref 0–149)
VLDL Cholesterol Cal: 25 mg/dL (ref 5–40)

## 2020-05-24 LAB — COMPREHENSIVE METABOLIC PANEL
ALT: 35 IU/L — ABNORMAL HIGH (ref 0–32)
AST: 26 IU/L (ref 0–40)
Albumin/Globulin Ratio: 1.6 (ref 1.2–2.2)
Albumin: 4.2 g/dL (ref 3.8–4.8)
Alkaline Phosphatase: 106 IU/L (ref 44–121)
BUN/Creatinine Ratio: 13 (ref 9–23)
BUN: 11 mg/dL (ref 6–24)
Bilirubin Total: 0.2 mg/dL (ref 0.0–1.2)
CO2: 23 mmol/L (ref 20–29)
Calcium: 9.1 mg/dL (ref 8.7–10.2)
Chloride: 105 mmol/L (ref 96–106)
Creatinine, Ser: 0.84 mg/dL (ref 0.57–1.00)
GFR calc Af Amer: 94 mL/min/{1.73_m2} (ref >59)
GFR calc non Af Amer: 81 mL/min/{1.73_m2} (ref >59)
Globulin, Total: 2.6 g/dL (ref 1.5–4.5)
Glucose: 79 mg/dL (ref 65–99)
Potassium: 4.8 mmol/L (ref 3.5–5.2)
Sodium: 141 mmol/L (ref 134–144)
Total Protein: 6.8 g/dL (ref 6.0–8.5)

## 2020-05-24 LAB — TSH: TSH: 1.05 u[IU]/mL (ref 0.450–4.500)

## 2020-05-24 LAB — HEPATITIS C ANTIBODY: Hep C Virus Ab: <0.1 s/co ratio (ref 0.0–0.9)

## 2020-05-24 MED ORDER — NA SULFATE-K SULFATE-MG SULF 17.5-3.13-1.6 GM/177ML PO SOLN: 1.0000 | Freq: Once | ORAL | 0 refills | Status: AC

## 2020-05-24 NOTE — Progress Notes (Signed)
Gastroenterology Pre-Procedure Review  Request Date: Friday 06/24/20 Requesting Physician: Dr. Servando Snare  PATIENT REVIEW QUESTIONS: The patient responded to the following health history questions as indicated:    1. Are you having any GI issues? no 2. Do you have a personal history of Polyps? no 3. Do you have a family history of Colon Cancer or Polyps? yes (yes maternal grandmother colon cancer, 2 sisters colon polyps) 4. Diabetes Mellitus? no 5. Joint replacements in the past 12 months?no 6. Major health problems in the past 3 months?no 7. Any artificial heart valves, MVP, or defibrillator?no    MEDICATIONS & ALLERGIES:    Patient reports the following regarding taking any anticoagulation/antiplatelet therapy:   Plavix, Coumadin, Eliquis, Xarelto, Lovenox, Pradaxa, Brilinta, or Effient? no Aspirin? no  Patient confirms/reports the following medications:  Current Outpatient Medications  Medication Sig Dispense Refill  . albuterol (VENTOLIN HFA) 108 (90 Base) MCG/ACT inhaler Inhale 2 puffs into the lungs every 6 (six) hours as needed for wheezing or shortness of breath. 1 each 2  . calcium citrate-vitamin D 500-500 MG-UNIT chewable tablet Chew 1 tablet by mouth 4 (four) times daily.    . Multiple Vitamins-Minerals (BARIATRIC MULTIVITAMINS/IRON PO) Take 1 tablet by mouth 2 (two) times daily.    . NON FORMULARY CPAP at 10 cm H2O    . oxymetazoline (AFRIN) 0.05 % nasal spray Place 1 spray into both nostrils 2 (two) times daily.    . Probiotic Product (CULTURELLE PROBIOTICS PO) Take by mouth daily.    . sertraline (ZOLOFT) 100 MG tablet Take 1 tablet (100 mg total) by mouth daily. 30 tablet 1  . TURMERIC CURCUMIN PO Take by mouth daily.     No current facility-administered medications for this visit.    Patient confirms/reports the following allergies:  Allergies  Allergen Reactions  . Codeine Other (See Comments)    In Cough Syrup- Caused Migraines  . Levonorgestrel-Ethinyl Estrad  Other (See Comments)    Tory Emerald) birth control patch "made me sick"    No orders of the defined types were placed in this encounter.   AUTHORIZATION INFORMATION Primary Insurance: 1D#: Group #:  Secondary Insurance: 1D#: Group #:  SCHEDULE INFORMATION: Date: Friday 06/24/20 Time: Location:[facility]

## 2020-06-14 ENCOUNTER — Other Ambulatory Visit: Payer: Self-pay | Admitting: Internal Medicine

## 2020-06-14 DIAGNOSIS — F3341 Major depressive disorder, recurrent, in partial remission: Secondary | ICD-10-CM

## 2020-06-14 NOTE — Telephone Encounter (Signed)
Requested medication (s) are due for refill today:   No  Requested medication (s) are on the active medication list:   Yes  Future visit scheduled:   Yes on 08/09/2020   Last ordered: 05/23/2020 #30, 1 refill.   Returned because pharmacy requesting a 90 day supply and a DX Code.  Requested Prescriptions  Pending Prescriptions Disp Refills   sertraline (ZOLOFT) 100 MG tablet [Pharmacy Med Name: SERTRALINE HCL 100 MG TABLET] 90 tablet 1    Sig: TAKE 1 TABLET BY MOUTH EVERY DAY      Psychiatry:  Antidepressants - SSRI Passed - 06/14/2020  1:31 PM      Passed - Completed PHQ-2 or PHQ-9 in the last 360 days      Passed - Valid encounter within last 6 months    Recent Outpatient Visits           3 weeks ago Annual physical exam   [facility] Glean Hess, [facility]   1 year ago Annual physical exam   [facility] Glean Hess, MD   1 year ago Environmental and seasonal allergies   [facility] Glean Hess, MD   2 years ago Recurrent major depressive disorder, in partial remission [facility])   [facility] Glean Hess, [facility]   2 years ago [facility] Glean Hess, MD       Future Appointments             In 1 month [facility] Melia Jesse Sans, MD [facility] [place]   In 11 months Army Melia Jesse Sans, MD [facility], [facility]

## 2020-06-15 ENCOUNTER — Encounter: Payer: Self-pay | Admitting: Anesthesiology

## 2020-06-15 ENCOUNTER — Encounter: Payer: Self-pay | Admitting: Gastroenterology

## 2020-06-22 ENCOUNTER — Other Ambulatory Visit: Admission: RE | Admit: 2020-06-22 | Payer: BC Managed Care – PPO | Source: Ambulatory Visit

## 2020-06-24 ENCOUNTER — Ambulatory Visit
Admission: RE | Admit: 2020-06-24 | Payer: BC Managed Care – PPO | Source: Home / Self Care | Admitting: Gastroenterology

## 2020-06-24 HISTORY — DX: Motion sickness, initial encounter: T75.3XXA

## 2020-06-24 HISTORY — DX: Attention-deficit hyperactivity disorder, unspecified type: F90.9

## 2020-06-24 HISTORY — DX: Atrophy of kidney (terminal): N26.1

## 2020-06-24 SURGERY — COLONOSCOPY WITH PROPOFOL
Anesthesia: Choice

## 2020-07-21 ENCOUNTER — Other Ambulatory Visit: Payer: Self-pay | Admitting: Internal Medicine

## 2020-07-21 DIAGNOSIS — F3341 Major depressive disorder, recurrent, in partial remission: Secondary | ICD-10-CM

## 2020-07-23 DIAGNOSIS — M25551 Pain in right hip: Secondary | ICD-10-CM | POA: Diagnosis not present

## 2020-08-09 ENCOUNTER — Ambulatory Visit: Payer: BC Managed Care – PPO | Admitting: Internal Medicine

## 2020-08-14 ENCOUNTER — Other Ambulatory Visit: Payer: Self-pay | Admitting: Internal Medicine

## 2020-08-14 DIAGNOSIS — F3341 Major depressive disorder, recurrent, in partial remission: Secondary | ICD-10-CM

## 2020-08-14 NOTE — Telephone Encounter (Signed)
Requested Prescriptions  Pending Prescriptions Disp Refills  . sertraline (ZOLOFT) 100 MG tablet [Pharmacy Med Name: SERTRALINE HCL 100 MG TABLET] 90 tablet 1    Sig: TAKE 1 TABLET BY MOUTH EVERY DAY     Psychiatry:  Antidepressants - SSRI Passed - 08/14/2020  2:33 PM      Passed - Completed PHQ-2 or PHQ-9 in the last 360 days      Passed - Valid encounter within last 6 months    Recent Outpatient Visits          2 months ago Annual physical exam   [facility] Glean Hess, MD   1 year ago Annual physical exam   [facility] Glean Hess, MD   1 year ago Environmental and seasonal allergies   [facility] Glean Hess, MD   2 years ago Recurrent major depressive disorder, in partial remission [facility])   [facility] Glean Hess, MD   2 years ago [facility] Glean Hess, MD      Future Appointments            In 9 months Army Melia Jesse Sans, MD [facility], [facility]

## 2020-08-19 ENCOUNTER — Other Ambulatory Visit: Payer: Self-pay | Admitting: Internal Medicine

## 2020-09-02 ENCOUNTER — Other Ambulatory Visit: Payer: Self-pay

## 2020-09-05 ENCOUNTER — Ambulatory Visit: Payer: Self-pay | Admitting: Urology

## 2020-09-05 ENCOUNTER — Ambulatory Visit: Payer: BC Managed Care – PPO | Admitting: Podiatry

## 2020-09-19 ENCOUNTER — Other Ambulatory Visit: Payer: Self-pay

## 2020-09-19 DIAGNOSIS — N200 Calculus of kidney: Secondary | ICD-10-CM

## 2020-09-19 NOTE — Addendum Note (Signed)
Addended by: Evelina Bucy on: 09/19/2020 08:21 AM   Modules accepted: Orders

## 2020-09-20 ENCOUNTER — Encounter: Payer: Self-pay | Admitting: Urology

## 2020-09-20 ENCOUNTER — Other Ambulatory Visit: Payer: Self-pay

## 2020-09-20 DIAGNOSIS — N200 Calculus of kidney: Secondary | ICD-10-CM

## 2020-09-22 ENCOUNTER — Ambulatory Visit: Payer: Self-pay | Admitting: Urology

## 2020-10-05 ENCOUNTER — Other Ambulatory Visit: Payer: BC Managed Care – PPO

## 2020-10-05 ENCOUNTER — Other Ambulatory Visit: Payer: Self-pay

## 2020-10-05 DIAGNOSIS — N200 Calculus of kidney: Secondary | ICD-10-CM

## 2020-10-06 LAB — BASIC METABOLIC PANEL
BUN/Creatinine Ratio: 15 (ref 9–23)
BUN: 15 mg/dL (ref 6–24)
CO2: 24 mmol/L (ref 20–29)
Calcium: 9.2 mg/dL (ref 8.7–10.2)
Chloride: 104 mmol/L (ref 96–106)
Creatinine, Ser: 0.97 mg/dL (ref 0.57–1.00)
Glucose: 89 mg/dL (ref 65–99)
Potassium: 5 mmol/L (ref 3.5–5.2)
Sodium: 142 mmol/L (ref 134–144)
eGFR: 71 mL/min/{1.73_m2} (ref >59)

## 2020-10-12 ENCOUNTER — Encounter: Payer: Self-pay | Admitting: Urology

## 2020-10-12 ENCOUNTER — Other Ambulatory Visit: Payer: Self-pay

## 2020-10-12 ENCOUNTER — Ambulatory Visit (INDEPENDENT_AMBULATORY_CARE_PROVIDER_SITE_OTHER): Payer: BC Managed Care – PPO | Admitting: Urology

## 2020-10-12 VITALS — BP 97/60 | HR 62 | Ht 65.0 in | Wt 182.0 lb

## 2020-10-12 DIAGNOSIS — N200 Calculus of kidney: Secondary | ICD-10-CM

## 2020-10-12 DIAGNOSIS — N3281 Overactive bladder: Secondary | ICD-10-CM

## 2020-10-12 NOTE — Patient Instructions (Signed)
Textbook of Natural Medicine (5th ed., pp. 1518-1527.e3). [place], [place]: Elsevier.">  Dietary Guidelines to Help Prevent Kidney Stones Kidney stones are deposits of minerals and salts that form inside your kidneys. Your risk of developing kidney stones may be greater depending on your diet, your lifestyle, the medicines you take, and whether you have certain medical conditions. Most people can lower their chances of developing kidney stones by following the instructions below. Your dietitian may give you more specific instructions depending on your overall health and the type of kidney stones you tend to develop. What are tips for following this plan? Reading food labels  Choose foods with "no salt added" or "low-salt" labels. Limit your salt (sodium) intake to less than 1,500 mg a day.  Choose foods with calcium for each meal and snack. Try to eat about 300 mg of calcium at each meal. Foods that contain 200-500 mg of calcium a serving include: ? 8 oz (237 mL) of milk, calcium-fortifiednon-dairy milk, and calcium-fortifiedfruit juice. Calcium-fortified means that calcium has been added to these drinks. ? 8 oz (237 mL) of kefir, yogurt, and soy yogurt. ? 4 oz (114 g) of tofu. ? 1 oz (28 g) of cheese. ? 1 cup (150 g) of dried figs. ? 1 cup (91 g) of cooked broccoli. ? One 3 oz (85 g) can of sardines or mackerel. Most people need 1,000-1,500 mg of calcium a day. Talk to your dietitian about how much calcium is recommended for you.   Shopping  Buy plenty of fresh fruits and vegetables. Most people do not need to avoid fruits and vegetables, even if these foods contain nutrients that may contribute to kidney stones.  When shopping for convenience foods, choose: ? Whole pieces of fruit. ? Pre-made salads with dressing on the side. ? Low-fat fruit and yogurt smoothies.  Avoid buying frozen meals or prepared deli foods. These can be high in sodium.  Look for foods with live cultures, such as  yogurt and kefir.  Choose high-fiber grains, such as whole-wheat breads, oat bran, and wheat cereals. Cooking  Do not add salt to food when cooking. Place a salt shaker on the table and allow each person to add his or her own salt to taste.  Use vegetable protein, such as beans, textured vegetable protein (TVP), or tofu, instead of meat in pasta, casseroles, and soups. Meal planning  Eat less salt, if told by your dietitian. To do this: ? Avoid eating processed or pre-made food. ? Avoid eating fast food.  Eat less animal protein, including cheese, meat, poultry, or fish, if told by your dietitian. To do this: ? Limit the number of times you have meat, poultry, fish, or cheese each week. Eat a diet free of meat at least 2 days a week. ? Eat only one serving each day of meat, poultry, fish, or seafood. ? When you prepare animal protein, cut pieces into small portion sizes. For most meat and fish, one serving is about the size of the palm of your hand.  Eat at least five servings of fresh fruits and vegetables each day. To do this: ? Keep fruits and vegetables on hand for snacks. ? Eat one piece of fruit or a handful of berries with breakfast. ? Have a salad and fruit at lunch. ? Have two kinds of vegetables at dinner.  Limit foods that are high in a substance called oxalate. These include: ? Spinach (cooked), rhubarb, beets, sweet potatoes, and Swiss chard. ? Peanuts. ?   Potato chips, french fries, and baked potatoes with skin on. ? Nuts and nut products. ? Chocolate.  If you regularly take a diuretic medicine, make sure to eat at least 1 or 2 servings of fruits or vegetables that are high in potassium each day. These include: ? Avocado. ? Banana. ? Orange, prune, carrot, or tomato juice. ? Baked potato. ? Cabbage. ? Beans and split peas. Lifestyle  Drink enough fluid to keep your urine pale yellow. This is the most important thing you can do. Spread your fluid intake throughout  the day.  If you drink alcohol: ? Limit how much you use to:  0-1 drink a day for women who are not pregnant.  0-2 drinks a day for men. ? Be aware of how much alcohol is in your drink. In the [place] one drink equals one 12 oz bottle of beer (355 mL), one 5 oz glass of wine (148 mL), or one 1 oz glass of hard liquor (44 mL).  Lose weight if told by your health care provider. Work with your dietitian to find an eating plan and weight loss strategies that work best for you.   General information  Talk to your health care provider and dietitian about taking daily supplements. You may be told the following depending on your health and the cause of your kidney stones: ? Not to take supplements with vitamin C. ? To take a calcium supplement. ? To take a daily probiotic supplement. ? To take other supplements such as magnesium, fish oil, or vitamin B6.  Take over-the-counter and prescription medicines only as told by your health care provider. These include supplements. What foods should I limit? Limit your intake of the following foods, or eat them as told by your dietitian. Vegetables Spinach. Rhubarb. Beets. Canned vegetables. Angie Fava. Olives. Baked potatoes with skin. Grains Wheat bran. Baked goods. Salted crackers. Cereals high in sugar. Meats and other proteins Nuts. Nut butters. Large portions of meat, poultry, or fish. Salted, precooked, or cured meats, such as sausages, meat loaves, and hot dogs. Dairy Cheese. Beverages Regular soft drinks. Regular vegetable juice. Seasonings and condiments Seasoning blends with salt. Salad dressings. Soy sauce. Ketchup. Barbecue sauce. Other foods Canned soups. Canned pasta sauce. Casseroles. Pizza. Lasagna. Frozen meals. Potato chips. [place] fries. The items listed above may not be a complete list of foods and beverages you should limit. Contact a dietitian for more information. What foods should I avoid? Talk to your dietitian about  specific foods you should avoid based on the type of kidney stones you have and your overall health. Fruits Grapefruit. The item listed above may not be a complete list of foods and beverages you should avoid. Contact a dietitian for more information. Summary  Kidney stones are deposits of minerals and salts that form inside your kidneys.  You can lower your risk of kidney stones by making changes to your diet.  The most important thing you can do is drink enough fluid. Drink enough fluid to keep your urine pale yellow.  Talk to your dietitian about how much calcium you should have each day, and eat less salt and animal protein as told by your dietitian. This information is not intended to replace advice given to you by your health care provider. Make sure you discuss any questions you have with your health care provider. Document Revised: 05/07/2019 Document Reviewed: 05/07/2019 Elsevier Patient Education  2021 [employer].   Kegel Exercises  Kegel exercises can help strengthen your pelvic  floor muscles. The pelvic floor is a group of muscles that support your rectum, small intestine, and bladder. In females, pelvic floor muscles also help support the womb (uterus). These muscles help you control the flow of urine and stool. Kegel exercises are painless and simple, and they do not require any equipment. Your provider may suggest Kegel exercises to:  Improve bladder and bowel control.  Improve sexual response.  Improve weak pelvic floor muscles after surgery to remove the uterus (hysterectomy) or pregnancy (females).  Improve weak pelvic floor muscles after prostate gland removal or surgery (males). Kegel exercises involve squeezing your pelvic floor muscles, which are the same muscles you squeeze when you try to stop the flow of urine or keep from passing gas. The exercises can be done while sitting, standing, or lying down, but it is best to vary your position. Exercises How to do  Kegel exercises: 1. Squeeze your pelvic floor muscles tight. You should feel a tight lift in your rectal area. If you are a female, you should also feel a tightness in your vaginal area. Keep your stomach, buttocks, and legs relaxed. 2. Hold the muscles tight for up to 10 seconds. 3. Breathe normally. 4. Relax your muscles. 5. Repeat as told by your health care provider. Repeat this exercise daily as told by your health care provider. Continue to do this exercise for at least 4-6 weeks, or for as long as told by your health care provider. You may be referred to a physical therapist who can help you learn more about how to do Kegel exercises. Depending on your condition, your health care provider may recommend:  Varying how long you squeeze your muscles.  Doing several sets of exercises every day.  Doing exercises for several weeks.  Making Kegel exercises a part of your regular exercise routine. This information is not intended to replace advice given to you by your health care provider. Make sure you discuss any questions you have with your health care provider. Document Revised: 09/18/2019 Document Reviewed: 01/01/2018 Elsevier Patient Education  2021 [employer].   Overactive Bladder, Adult  Overactive bladder is a condition in which a person has a sudden and frequent need to urinate. A person might also leak urine if he or she cannot get to the bathroom fast enough (urinary incontinence). Sometimes, symptoms can interfere with work or social activities. What are the causes? Overactive bladder is associated with poor nerve signals between your bladder and your brain. Your bladder may get the signal to empty before it is full. You may also have very sensitive muscles that make your bladder squeeze too soon. This condition may also be caused by other factors, such as:  Medical conditions: ? Urinary tract infection. ? Infection of nearby tissues. ? Prostate enlargement. ? Bladder  stones, inflammation, or tumors. ? Diabetes. ? Muscle or nerve weakness, especially from these conditions:  A spinal cord injury.  Stroke.  Multiple sclerosis.  Parkinson's disease.  Other causes: ? Surgery on the uterus or urethra. ? Drinking too much caffeine or alcohol. ? Certain medicines, especially those that eliminate extra fluid in the body (diuretics). ? Constipation. What increases the risk? You may be at greater risk for overactive bladder if you:  Are an older adult.  Smoke.  Are going through menopause.  Have prostate problems.  Have a neurological disease, such as stroke, dementia, Parkinson's disease, or multiple sclerosis (MS).  Eat or drink alcohol, spicy food, caffeine, and other things that irritate the  bladder.  Are overweight or obese. What are the signs or symptoms? Symptoms of this condition include a sudden, strong urge to urinate. Other symptoms include:  Leaking urine.  Urinating 8 or more times a day.  Waking up to urinate 2 or more times overnight. How is this diagnosed? This condition may be diagnosed based on:  Your symptoms and medical history.  A physical exam.  Blood or urine tests to check for possible causes, such as infection. You may also need to see a health care provider who specializes in urinary tract problems. This is called a urologist. How is this treated? Treatment for overactive bladder depends on the cause of your condition and whether it is mild or severe. Treatment may include:  Bladder training, such as: ? Learning to control the urge to urinate by following a schedule to urinate at regular intervals. ? Doing Kegel exercises to strengthen the pelvic floor muscles that support your bladder.  Special devices, such as: ? Biofeedback. This uses sensors to help you become aware of your body's signals. ? Electrical stimulation. This uses electrodes placed inside the body (implanted) or outside the body. These  electrodes send gentle pulses of electricity to strengthen the nerves or muscles that control the bladder. ? Women may use a plastic device, called a pessary, that fits into the vagina and supports the bladder.  Medicines, such as: ? Antibiotics to treat bladder infection. ? Antispasmodics to stop the bladder from releasing urine at the wrong time. ? Tricyclic antidepressants to relax bladder muscles. ? Injections of botulinum toxin type A directly into the bladder tissue to relax bladder muscles.  Surgery, such as: ? A device may be implanted to help manage the nerve signals that control urination. ? An electrode may be implanted to stimulate electrical signals in the bladder. ? A procedure may be done to change the shape of the bladder. This is done only in very severe cases. Follow these instructions at home: Eating and drinking  Make diet or lifestyle changes recommended by your health care provider. These may include: ? Drinking fluids throughout the day and not only with meals. ? Cutting down on caffeine or alcohol. ? Eating a healthy and balanced diet to prevent constipation. This may include:  Choosing foods that are high in fiber, such as beans, whole grains, and fresh fruits and vegetables.  Limiting foods that are high in fat and processed sugars, such as fried and sweet foods.   Lifestyle  Lose weight if needed.  Do not use any products that contain nicotine or tobacco. These include cigarettes, chewing tobacco, and vaping devices, such as e-cigarettes. If you need help quitting, ask your health care provider.   General instructions  Take over-the-counter and prescription medicines only as told by your health care provider.  If you were prescribed an antibiotic medicine, take it as told by your health care provider. Do not stop taking the antibiotic even if you start to feel better.  Use any implants or pessary as told by your health care provider.  If needed, wear  pads to absorb urine leakage.  Keep a log to track how much and when you drink, and when you need to urinate. This will help your health care provider monitor your condition.  Keep all follow-up visits. This is important. Contact a health care provider if:  You have a fever or chills.  Your symptoms do not get better with treatment.  Your pain and discomfort get worse.  You  have more frequent urges to urinate. Get help right away if:  You are not able to control your bladder. Summary  Overactive bladder refers to a condition in which a person has a sudden and frequent need to urinate.  Several conditions may lead to an overactive bladder.  Treatment for overactive bladder depends on the cause and severity of your condition.  Making lifestyle changes, doing Kegel exercises, keeping a log, and taking medicines can help with this condition. This information is not intended to replace advice given to you by your health care provider. Make sure you discuss any questions you have with your health care provider. Document Revised: 02/01/2020 Document Reviewed: 02/01/2020 Elsevier Patient Education  2021 [employer].

## 2020-10-12 NOTE — Progress Notes (Signed)
   10/12/2020 3:14 PM   Meghan Weeks 1971-04-10 [id number]  Reason for visit: Follow up nephrolithiasis, atrophic right kidney  HPI: I saw [name] back in urology clinic for the above issues.  She is a 50 year old female with a history of nephrolithiasis including a 9 mm left distal ureteral stone treated with ureteroscopy in November 2020.  She has a chronically atrophic right kidney.  Renal function has been normal.  A follow-up renal ultrasound suggested some possible residual calyceal dilation on the left side, but this was asymptomatic.  She did not follow-up for repeat ultrasound is recommended.  She denies any complaints today, specifically any flank pain, gross hematuria, recurrent stones.  She has mild overactive bladder symptoms of occasional frequency urgency, but is not interested in medications at this time.  Renal function is normal at 0.97, EGFR greater than 60.  We discussed general stone prevention strategies including adequate hydration with goal of producing 2.5 L of urine daily, increasing citric acid intake, increasing calcium intake during high oxalate meals, minimizing animal protein, and decreasing salt intake. Information about dietary recommendations given today.   Follow-up as needed   Billey Co, MD  [facility] [address] [place],  [id number] [phone]

## 2021-01-27 DIAGNOSIS — M7061 Trochanteric bursitis, right hip: Secondary | ICD-10-CM | POA: Diagnosis not present

## 2021-02-09 DIAGNOSIS — M7061 Trochanteric bursitis, right hip: Secondary | ICD-10-CM | POA: Diagnosis not present

## 2021-02-15 DIAGNOSIS — M25551 Pain in right hip: Secondary | ICD-10-CM | POA: Insufficient documentation

## 2021-03-05 ENCOUNTER — Other Ambulatory Visit: Payer: Self-pay

## 2021-03-05 ENCOUNTER — Encounter: Payer: Self-pay | Admitting: Emergency Medicine

## 2021-03-05 ENCOUNTER — Ambulatory Visit
Admission: EM | Admit: 2021-03-05 | Discharge: 2021-03-05 | Disposition: A | Discharge status: Home / Self Care | Payer: BC Managed Care – PPO | Attending: Emergency Medicine | Admitting: Emergency Medicine

## 2021-03-05 DIAGNOSIS — T485X5A Adverse effect of other anti-common-cold drugs, initial encounter: Secondary | ICD-10-CM | POA: Diagnosis not present

## 2021-03-05 DIAGNOSIS — R0981 Nasal congestion: Secondary | ICD-10-CM | POA: Diagnosis not present

## 2021-03-05 MED ORDER — IPRATROPIUM BROMIDE 0.06 % NA SOLN: 2.0000 | Freq: Four times a day (QID) | NASAL | 12 refills | Status: DC

## 2021-03-05 NOTE — ED Provider Notes (Signed)
[facility]    CSN: [id number] Arrival date & time: 03/05/21  1019      History   Chief Complaint Chief Complaint  Patient presents with   Sinus Problem    HPI Meghan Weeks is a 50 y.o. female.   HPI  50 year old female here for evaluation of sinus congestion.  Patient reports that for the last 2 weeks she has been experiencing sinus congestion and pressure that is free of nasal discharge, fever, sore throat, cough, GI complaints.  She does report in intermittent ear fullness and dizziness.  Both of which are not present now.  Patient reports that she has been using Afrin daily and has been since 2020.  Past Medical History:  Diagnosis Date   ADHD    Anxiety    Bronchitis    Car sickness    Cholelithiasis    Complication of anesthesia    Depression    History of basal cell cancer    forehead and nose   Kidney atrophy    right kidney shrinking?   Neuroma of foot    PONV (postoperative nausea and vomiting)    Psoriasis    Renal stones 03/2017   right    Sleep apnea 2007 or 2008   CPAP    Patient Active Problem List   Diagnosis Date Noted   S/P TAH-BSO 05/22/2020   Recurrent major depressive disorder, in partial remission ([place]) 01/21/2019   Bariatric surgery status 12/16/2017   Sleep apnea    Psoriasis    Neuroma of foot    History of basal cell cancer    Anxiety    Chronic pain of right knee 05/18/2016   Rectocele 06/25/2011    Past Surgical History:  Procedure Laterality Date   ABDOMINAL HYSTERECTOMY     BASAL CELL CARCINOMA EXCISION  03/06/2017   On [place]     CYSTOSCOPY/URETEROSCOPY/HOLMIUM LASER/STENT PLACEMENT Left 04/24/2019   Procedure: CYSTOSCOPY/LEFT URETEROSCOPY AND LEFT RETROGRADE PYELOGRAM/HOLMIUM LASER/STENT PLACEMENT;  Surgeon: Billey Co, MD;  Location: [facility] ORS;  Service: Urology;  Laterality: Left;   EXCISION MORTON'S NEUROMA Right    gastric sleeve surgery  2013    with repair of hiatal hernia   REVISION OF GASTRIC SLEEVE TO BILIOPANCREATIC DIVERSION WITH DUODENAL SWITCH LAPAROSCOPIC  03/13/2018   right elbow surgery     right wrist surgery      OB History   No obstetric history on file.      Home Medications    Prior to Admission medications   Medication Sig Start Date End Date Taking? Authorizing Provider  calcium citrate-vitamin D 500-500 MG-UNIT chewable tablet Chew 1 tablet by mouth 4 (four) times daily.   Yes [provider]  ipratropium (ATROVENT) 0.06 % nasal spray Place 2 sprays into both nostrils 4 (four) times daily. 03/05/21  Yes Margarette Canada, NP  Multiple Vitamins-Minerals (BARIATRIC MULTIVITAMINS/IRON PO) Take 1 tablet by mouth 2 (two) times daily.   Yes [provider]  oxymetazoline (AFRIN) 0.05 % nasal spray Place 1 spray into both nostrils 2 (two) times daily.   Yes [provider]  albuterol (VENTOLIN HFA) 108 (90 Base) MCG/ACT inhaler TAKE 2 PUFFS BY MOUTH EVERY 6 HOURS AS NEEDED FOR WHEEZE OR SHORTNESS OF BREATH 08/19/20   Glean Hess, MD  NON FORMULARY CPAP at 10 cm H2O    [provider]  buPROPion (WELLBUTRIN XL) 150 MG 24 hr tablet TAKE  1 TABLET BY MOUTH TWICE A DAY 07/16/19 10/27/19  Glean Hess, MD  promethazine (PHENERGAN) 12.5 MG tablet Take 1 tablet (12.5 mg total) by mouth every 6 (six) hours as needed for nausea or vomiting. 04/21/19 05/16/19  Billey Co, MD    Family History Family History  Problem Relation Age of Onset   Hematuria Mother    Diabetes Father    Hypertension Father    Diabetes Sister    Hypertension Sister    Colon cancer Maternal Grandmother    Uterine cancer Maternal Grandmother    Kidney cancer Neg Hx    Bladder Cancer Neg Hx     Social History Social History   Tobacco Use   Smoking status: Former    Packs/day: 0.50    Years: 10.00    Pack years: 5.00    Types: Cigarettes    Quit date: 1995    Years since quitting: 27.7    Smokeless tobacco: Never  Vaping Use   Vaping Use: Never used  Substance Use Topics   Alcohol use: Yes    Comment: social   Drug use: No     Allergies   Codeine and Levonorgestrel-ethinyl estrad   Review of Systems Review of Systems  Constitutional:  Negative for activity change, appetite change and fever.  HENT:  Positive for congestion and sinus pressure. Negative for ear pain, rhinorrhea and sore throat.   Respiratory:  Negative for cough, shortness of breath and wheezing.   Gastrointestinal:  Negative for diarrhea, nausea and vomiting.  Skin:  Negative for rash.  Hematological: Negative.   Psychiatric/Behavioral: Negative.      Physical Exam Triage Vital Signs ED Triage Vitals  Enc Vitals Group     BP 03/05/21 1108 107/73     Pulse Rate 03/05/21 1108 60     Resp 03/05/21 1108 14     Temp 03/05/21 1108 98 F (36.7 C)     Temp Source 03/05/21 1108 Oral     SpO2 03/05/21 1108 100 %     Weight 03/05/21 1105 182 lb (82.6 kg)     Height 03/05/21 1105 5\' 5"  (1.651 m)     Head Circumference --      Peak Flow --      Pain Score 03/05/21 1105 4     Pain Loc --      Pain Edu? --      Excl. in [place]? --    No data found.  Updated Vital Signs BP 107/73 (BP Location: Left Arm)   Pulse 60   Temp 98 F (36.7 C) (Oral)   Resp 14   Ht 5\' 5"  (1.651 m)   Wt 182 lb (82.6 kg)   SpO2 100%   BMI 30.29 kg/m   Visual Acuity Right Eye Distance:   Left Eye Distance:   Bilateral Distance:    Right Eye Near:   Left Eye Near:    Bilateral Near:     Physical Exam Vitals and nursing note reviewed.  Constitutional:      General: She is not in acute distress.    Appearance: Normal appearance. She is obese. She is not ill-appearing.  HENT:     Head: Normocephalic and atraumatic.     Right Ear: Tympanic membrane, ear canal and external ear normal. There is no impacted cerumen.     Left Ear: Tympanic membrane, ear canal and external ear normal. There is no impacted cerumen.      Nose: Congestion present.  No rhinorrhea.     Mouth/Throat:     Mouth: Mucous membranes are moist.     Pharynx: Oropharynx is clear. No posterior oropharyngeal erythema.  Cardiovascular:     Rate and Rhythm: Normal rate and regular rhythm.     Pulses: Normal pulses.     Heart sounds: Normal heart sounds. No murmur heard.   No gallop.  Pulmonary:     Effort: Pulmonary effort is normal.     Breath sounds: No wheezing or rales.  Musculoskeletal:     Cervical back: Normal range of motion and neck supple.  Lymphadenopathy:     Cervical: No cervical adenopathy.  Skin:    General: Skin is warm and dry.     Capillary Refill: Capillary refill takes less than 2 seconds.     Findings: No erythema or rash.  Neurological:     General: No focal deficit present.     Mental Status: She is alert and oriented to person, place, and time.  Psychiatric:        Mood and Affect: Mood normal.        Behavior: Behavior normal.        Thought Content: Thought content normal.        Judgment: Judgment normal.     UC Treatments / Results  Labs (all labs ordered are listed, but only abnormal results are displayed) Labs Reviewed - No data to display  EKG   Radiology No results found.  Procedures Procedures (including critical care time)  Medications Ordered in UC Medications - No data to display  Initial Impression / Assessment and Plan / UC Course  I have reviewed the triage vital signs and the nursing notes.  Pertinent labs & imaging results that were available during my care of the patient were reviewed by me and considered in my medical decision making (see chart for details).  Patient is a nontoxic-appearing 50 year old female here for evaluation of sinus congestion has been present x2 weeks.  Patient has no other associated upper or lower respiratory symptoms.  She does admit that she uses Afrin daily and has since 2020.  The patient does also endorse intermittent ear fullness and  dizziness, both which and are present now, she states that when she feels this she starts taking her Afrin.  Patient's physical exam reveals protegrin tympanic membranes bilaterally with normal light reflex and clear external auditory canals.  Nasal mucosa is edematous and mildly erythematous.  There is no nasal discharge appreciated.  Oropharyngeal exam is benign.  No cervical lymphadenopathy appreciated on exam.  Cardiopulmonary exam reveals clear lung sounds in all fields.  Patient exam is consistent with nasal congestion brought on by overuse of nasal decongestants.  I have advised her to stop using the Afrin daily as this is what is causing her symptoms.  We will give Atrovent nasal spray that she can use intermittently as her body weans off of the Afrin.  Patient vies to return for new or worsening symptoms.   Final Clinical Impressions(s) / UC Diagnoses   Final diagnoses:  Nasal congestion due to prolonged use of decongestants     Discharge Instructions      Stop using Afrin.  Use the Atrovent nasal spray, 2 squirts in each nostril every 6 hours as needed for congestion.  Increase your oral fluid intake to help keep your mucous thin.  Return for new or worsening symptoms.      ED Prescriptions     Medication Sig Dispense  Auth. Provider   ipratropium (ATROVENT) 0.06 % nasal spray Place 2 sprays into both nostrils 4 (four) times daily. 15 mL Margarette Canada, NP      PDMP not reviewed this encounter.   Margarette Canada, NP 03/05/21 1201

## 2021-03-05 NOTE — ED Triage Notes (Signed)
Patient c/o sinus congestion and pressure for 2 weeks.  Patient states that she has been using Afrin nasal spray every day.  Patient denies fevers.

## 2021-03-05 NOTE — Discharge Instructions (Addendum)
Stop using Afrin.  Use the Atrovent nasal spray, 2 squirts in each nostril every 6 hours as needed for congestion.  Increase your oral fluid intake to help keep your mucous thin.  Return for new or worsening symptoms.

## 2021-03-10 DIAGNOSIS — M25551 Pain in right hip: Secondary | ICD-10-CM | POA: Diagnosis not present

## 2021-04-15 ENCOUNTER — Other Ambulatory Visit: Payer: Self-pay

## 2021-04-15 ENCOUNTER — Ambulatory Visit
Admission: EM | Admit: 2021-04-15 | Discharge: 2021-04-15 | Disposition: A | Discharge status: Home / Self Care | Payer: BC Managed Care – PPO | Attending: Emergency Medicine | Admitting: Emergency Medicine

## 2021-04-15 ENCOUNTER — Ambulatory Visit (INDEPENDENT_AMBULATORY_CARE_PROVIDER_SITE_OTHER): Payer: BC Managed Care – PPO

## 2021-04-15 DIAGNOSIS — R059 Cough, unspecified: Secondary | ICD-10-CM

## 2021-04-15 DIAGNOSIS — J069 Acute upper respiratory infection, unspecified: Secondary | ICD-10-CM

## 2021-04-15 DIAGNOSIS — R051 Acute cough: Secondary | ICD-10-CM | POA: Diagnosis not present

## 2021-04-15 MED ORDER — AZITHROMYCIN 250 MG PO TABS: 250.0000 mg | ORAL_TABLET | Freq: Every day | ORAL | 0 refills | Status: DC

## 2021-04-15 MED ORDER — IPRATROPIUM BROMIDE 0.06 % NA SOLN: 2.0000 | Freq: Four times a day (QID) | NASAL | 12 refills | Status: DC

## 2021-04-15 MED ORDER — PREDNISONE 20 MG PO TABS: 60.0000 mg | ORAL_TABLET | Freq: Every day | ORAL | 0 refills | Status: AC

## 2021-04-15 MED ORDER — AMOXICILLIN-POT CLAVULANATE 875-125 MG PO TABS: 1.0000 | ORAL_TABLET | Freq: Two times a day (BID) | ORAL | 0 refills | Status: DC

## 2021-04-15 MED ORDER — PROMETHAZINE-DM 6.25-15 MG/5ML PO SYRP: 5.0000 mL | ORAL_SOLUTION | Freq: Four times a day (QID) | ORAL | 0 refills | Status: DC | PRN

## 2021-04-15 MED ORDER — BENZONATATE 100 MG PO CAPS: 200.0000 mg | ORAL_CAPSULE | Freq: Three times a day (TID) | ORAL | 0 refills | Status: DC

## 2021-04-15 NOTE — ED Triage Notes (Signed)
Pt here with C/O cough, headache, for 7 days. No fever, had sore throat on Sunday and Monday none since.

## 2021-04-15 NOTE — ED Provider Notes (Signed)
[facility]    CSN: [id number] Arrival date & time: 04/15/21  9326      History   Chief Complaint Chief Complaint  Patient presents with   Cough    HPI Meghan Weeks is a 50 y.o. female.   HPI  50 year old female here for evaluation of respiratory complaints.  Patient reports that she has been experiencing a cough for the last 7 days that is intermittently productive for white sputum after she uses her inhaler.  She also has been experiencing nasal congestion with clear nasal discharge, she had a sore throat at the outset of symptoms but that is resolved, she also has intermittent shortness of breath which resolves with her inhaler.  Over the last several days she had a decrease in appetite and she has been intermittently lightheaded.  She denies fever, ear pain, wheezing, GI complaints, or body aches.  Patient has a history of sleep apnea and uses a BiPAP machine and she also has been experiencing increased shortness of breath whenever she gets a cold since she had COVID.  Past Medical History:  Diagnosis Date   ADHD    Anxiety    Bronchitis    Car sickness    Cholelithiasis    Complication of anesthesia    Depression    History of basal cell cancer    forehead and nose   Kidney atrophy    right kidney shrinking?   Neuroma of foot    PONV (postoperative nausea and vomiting)    Psoriasis    Renal stones 03/2017   right    Sleep apnea 2007 or 2008   CPAP    Patient Active Problem List   Diagnosis Date Noted   S/P TAH-BSO 05/22/2020   Recurrent major depressive disorder, in partial remission ([place]) 01/21/2019   Bariatric surgery status 12/16/2017   Sleep apnea    Psoriasis    Neuroma of foot    History of basal cell cancer    Anxiety    Chronic pain of right knee 05/18/2016   Rectocele 06/25/2011    Past Surgical History:  Procedure Laterality Date   ABDOMINAL HYSTERECTOMY     BASAL CELL CARCINOMA EXCISION  03/06/2017   On [place]     CYSTOSCOPY/URETEROSCOPY/HOLMIUM LASER/STENT PLACEMENT Left 04/24/2019   Procedure: CYSTOSCOPY/LEFT URETEROSCOPY AND LEFT RETROGRADE PYELOGRAM/HOLMIUM LASER/STENT PLACEMENT;  Surgeon: Billey Co, MD;  Location: [facility] OR[facility];  Service: Urology;  Laterality: Left;   EXCISION MORTON'S NEUROMA Right    gastric sleeve surgery  2013   with repair of hiatal hernia   REVISION OF GASTRIC SLEEVE TO BILIOPANCREATIC DIVERSION WITH DUODENAL SWITCH LAPAROSCOPIC  03/13/2018   right elbow surgery     right wrist surgery      OB History   No obstetric history on file.      Home Medications    Prior to Admission medications   Medication Sig Start Date End Date Taking? Authorizing Provider  albuterol (VENTOLIN HFA) 108 (90 Base) MCG/ACT inhaler TAKE 2 PUFFS BY MOUTH EVERY 6 HOURS AS NEEDED FOR WHEEZE OR SHORTNESS OF BREATH 08/19/20  Yes Glean Hess, MD  amoxicillin-clavulanate (AUGMENTIN) 875-125 MG tablet Take 1 tablet by mouth every 12 (twelve) hours. 04/15/21  Yes Margarette Canada, NP  azithromycin (ZITHROMAX Z-PAK) 250 MG tablet Take 1 tablet (250 mg total) by mouth daily. Take 2 tablets on the first day and then 1 tablet daily thereafter  for a total of 5 days of treatment. 04/15/21  Yes Margarette Canada, NP  benzonatate (TESSALON) 100 MG capsule Take 2 capsules (200 mg total) by mouth every 8 (eight) hours. 04/15/21  Yes Margarette Canada, NP  calcium citrate-vitamin D 500-500 MG-UNIT chewable tablet Chew 1 tablet by mouth 4 (four) times daily.   Yes [provider]  ipratropium (ATROVENT) 0.06 % nasal spray Place 2 sprays into both nostrils 4 (four) times daily. 04/15/21  Yes Margarette Canada, NP  Multiple Vitamins-Minerals (BARIATRIC MULTIVITAMINS/IRON PO) Take 1 tablet by mouth 2 (two) times daily.   Yes [provider]  NON FORMULARY CPAP at 10 cm H2O   Yes [provider]  oxymetazoline (AFRIN) 0.05 % nasal spray Place 1 spray into  both nostrils 2 (two) times daily.   Yes [provider]  predniSONE (DELTASONE) 20 MG tablet Take 3 tablets (60 mg total) by mouth daily with breakfast for 5 days. 3 tablets daily for 5 days. 04/15/21 04/20/21 Yes Margarette Canada, NP  promethazine-dextromethorphan (PROMETHAZINE-DM) 6.25-15 MG/5ML syrup Take 5 mLs by mouth 4 (four) times daily as needed. 04/15/21  Yes Margarette Canada, NP  buPROPion (WELLBUTRIN XL) 150 MG 24 hr tablet TAKE 1 TABLET BY MOUTH TWICE A DAY 07/16/19 10/27/19  Glean Hess, MD  promethazine (PHENERGAN) 12.5 MG tablet Take 1 tablet (12.5 mg total) by mouth every 6 (six) hours as needed for nausea or vomiting. 04/21/19 05/16/19  Billey Co, MD    Family History Family History  Problem Relation Age of Onset   Hematuria Mother    Diabetes Father    Hypertension Father    Diabetes Sister    Hypertension Sister    Colon cancer Maternal Grandmother    Uterine cancer Maternal Grandmother    Kidney cancer Neg Hx    Bladder Cancer Neg Hx     Social History Social History   Tobacco Use   Smoking status: Former    Packs/day: 0.50    Years: 10.00    Pack years: 5.00    Types: Cigarettes    Quit date: 1995    Years since quitting: 27.9   Smokeless tobacco: Never  Vaping Use   Vaping Use: Never used  Substance Use Topics   Alcohol use: Yes    Comment: social   Drug use: No     Allergies   Codeine and Levonorgestrel-ethinyl estrad   Review of Systems Review of Systems  Constitutional:  Positive for appetite change. Negative for activity change and fever.  HENT:  Positive for congestion, rhinorrhea and sore throat. Negative for ear pain.   Respiratory:  Positive for cough and shortness of breath. Negative for wheezing.   Gastrointestinal:  Negative for diarrhea, nausea and vomiting.  Musculoskeletal:  Negative for arthralgias and myalgias.  Skin:  Negative for rash.  Neurological:  Positive for headaches.  Hematological: Negative.    Psychiatric/Behavioral: Negative.      Physical Exam Triage Vital Signs ED Triage Vitals  Enc Vitals Group     BP 04/15/21 0834 114/74     Pulse Rate 04/15/21 0834 60     Resp 04/15/21 0834 18     Temp 04/15/21 0834 98.4 F (36.9 C)     Temp Source 04/15/21 0834 Oral     SpO2 04/15/21 0834 99 %     Weight 04/15/21 0833 185 lb (83.9 kg)     Height 04/15/21 0833 5\' 5"  (1.651 m)     Head Circumference --  Peak Flow --      Pain Score 04/15/21 0833 0     Pain Loc --      Pain Edu? --      Excl. in [place]? --    No data found.  Updated Vital Signs BP 114/74 (BP Location: Left Arm)   Pulse 60   Temp 98.4 F (36.9 C) (Oral)   Resp 18   Ht 5\' 5"  (1.651 m)   Wt 185 lb (83.9 kg)   SpO2 99%   BMI 30.79 kg/m   Visual Acuity Right Eye Distance:   Left Eye Distance:   Bilateral Distance:    Right Eye Near:   Left Eye Near:    Bilateral Near:     Physical Exam Vitals and nursing note reviewed.  Constitutional:      General: She is not in acute distress.    Appearance: Normal appearance. She is not ill-appearing.  HENT:     Head: Normocephalic and atraumatic.     Right Ear: Tympanic membrane, ear canal and external ear normal. There is no impacted cerumen.     Left Ear: Tympanic membrane, ear canal and external ear normal. There is no impacted cerumen.     Nose: Congestion and rhinorrhea present.     Mouth/Throat:     Mouth: Mucous membranes are moist.     Pharynx: Oropharynx is clear. Posterior oropharyngeal erythema present.  Cardiovascular:     Rate and Rhythm: Normal rate and regular rhythm.     Pulses: Normal pulses.     Heart sounds: Normal heart sounds. No murmur heard.   No gallop.  Pulmonary:     Effort: Pulmonary effort is normal.     Breath sounds: Rhonchi and rales present. No wheezing.     Comments: Fine crackles in the left lung base posteriorly Musculoskeletal:     Cervical back: Normal range of motion and neck supple.  Lymphadenopathy:      Cervical: Cervical adenopathy present.  Skin:    General: Skin is warm and dry.     Capillary Refill: Capillary refill takes less than 2 seconds.     Findings: No erythema or rash.  Neurological:     General: No focal deficit present.     Mental Status: She is alert and oriented to person, place, and time.  Psychiatric:        Mood and Affect: Mood normal.        Behavior: Behavior normal.        Thought Content: Thought content normal.        Judgment: Judgment normal.     UC Treatments / Results  Labs (all labs ordered are listed, but only abnormal results are displayed) Labs Reviewed - No data to display  EKG   Radiology DG Chest 2 View  Result Date: 04/15/2021 CLINICAL DATA:  Pt complains of cough x 1 week with no relief from OTC medications. Pt says that cough becomes productive after using inhaler EXAM: CHEST - 2 VIEW COMPARISON:  Chest radiograph 06/11/2019 FINDINGS: The cardiomediastinal contours are within normal limits. The lungs are clear. No pneumothorax or pleural effusion. No acute finding in the visualized skeleton. IMPRESSION: No active cardiopulmonary process. Electronically Signed   By: Audie Pinto M.D.   On: 04/15/2021 09:49    Procedures Procedures (including critical care time)  Medications Ordered in UC Medications - No data to display  Initial Impression / Assessment and Plan / UC Course  I have reviewed the  triage vital signs and the nursing notes.  Pertinent labs & imaging results that were available during my care of the patient were reviewed by me and considered in my medical decision making (see chart for details).  , Nontoxic-appearing 50 year old female here for evaluation of respiratory complaints as outlined in HPI above.  Patient does not have a history of COPD but she has had COVID and she experiences increasing shortness of breath and chest tightness when she gets sick.  She does have an albuterol inhaler which she has been using to  help her symptoms.  She has not been febrile.  Patient's physical exam reveals pearly gray tympanic membranes bilaterally with normal light reflex and clear external auditory canals.  Nasal mucosa is erythematous and edematous with clear to light yellow discharge in both nares.  Oropharyngeal exam reveals mild posterior oropharyngeal erythema with yellow to clear postnasal drip.  Patient does have bilateral anterior cervical lymphadenopathy.  Cardiopulmonary exam reveals fine crackles posteriorly in the left lung base.  Will obtain chest x-ray to look for the presence of pneumonia.  Chest x-ray independently reviewed and evaluated by me.  There is blunting of bilateral costophrenic angles.  The remainder the lung fields are well pneumatized and free of infiltrate.  Pulmonary vasculature is prominent however.  The blunting is possible atelectasis from COVID.  Radiology overread is pending. Radiology interpretation is negative for acute cardiopulmonary process.  Given patient's history of COVID and the length of symptoms I will do a trial of antibiotics as well as prednisone to help with pulmonary inflammation, Atrovent nasal spray, Tessalon Perles, Promethazine DM cough syrup   Final Clinical Impressions(s) / UC Diagnoses   Final diagnoses:  Upper respiratory tract infection, unspecified type  Acute cough     Discharge Instructions      Take the Augmentin twice daily for 7 days with food for treatment of your respiratory infection.  Take the azithromycin according to the package instructions.  You will take 2 tablets on day 1 and then 1 tablet on days 2 through 5.  Use your albuterol inhaler, 2 puffs every 4-6 hours as needed for shortness breath or wheezing.  Take the prednisone 60 mg daily at breakfast time starting today.  Use the Tessalon Perles every 8 hours as needed for cough during the day.  They will sometimes cause numbness to the base of your tongue or give you metallic taste in  her mouth.  This is normal.  You will need to take them with a small sip of water.  Use the Promethazine DM cough syrup at bedtime as needed for cough and congestion.  Return for reevaluation for new or worsening symptoms.     ED Prescriptions     Medication Sig Dispense Auth. Provider   amoxicillin-clavulanate (AUGMENTIN) 875-125 MG tablet Take 1 tablet by mouth every 12 (twelve) hours. 14 tablet Margarette Canada, NP   azithromycin (ZITHROMAX Z-PAK) 250 MG tablet Take 1 tablet (250 mg total) by mouth daily. Take 2 tablets on the first day and then 1 tablet daily thereafter for a total of 5 days of treatment. 6 tablet Margarette Canada, NP   ipratropium (ATROVENT) 0.06 % nasal spray Place 2 sprays into both nostrils 4 (four) times daily. 15 mL Margarette Canada, NP   benzonatate (TESSALON) 100 MG capsule Take 2 capsules (200 mg total) by mouth every 8 (eight) hours. 21 capsule Margarette Canada, NP   promethazine-dextromethorphan (PROMETHAZINE-DM) 6.25-15 MG/5ML syrup Take 5 mLs by mouth 4 (four)  times daily as needed. 118 mL Margarette Canada, NP   predniSONE (DELTASONE) 20 MG tablet Take 3 tablets (60 mg total) by mouth daily with breakfast for 5 days. 3 tablets daily for 5 days. 15 tablet Margarette Canada, NP      PDMP not reviewed this encounter.   Margarette Canada, NP 04/15/21 1005

## 2021-04-15 NOTE — Discharge Instructions (Addendum)
Take the Augmentin twice daily for 7 days with food for treatment of your respiratory infection.  Take the azithromycin according to the package instructions.  You will take 2 tablets on day 1 and then 1 tablet on days 2 through 5.  Use your albuterol inhaler, 2 puffs every 4-6 hours as needed for shortness breath or wheezing.  Take the prednisone 60 mg daily at breakfast time starting today.  Use the Tessalon Perles every 8 hours as needed for cough during the day.  They will sometimes cause numbness to the base of your tongue or give you metallic taste in her mouth.  This is normal.  You will need to take them with a small sip of water.  Use the Promethazine DM cough syrup at bedtime as needed for cough and congestion.  Return for reevaluation for new or worsening symptoms.

## 2021-05-26 ENCOUNTER — Encounter: Payer: BC Managed Care – PPO | Admitting: Internal Medicine

## 2021-05-26 NOTE — Progress Notes (Deleted)
Date:  05/26/2021   Name:  Meghan Weeks   DOB:  1970/07/26   MRN:  [id number]   Chief Complaint: No chief complaint on file. Meghan Weeks is a 50 y.o. female who presents today for her Complete Annual Exam. She feels {DESC; WELL/FAIRLY WELL/POORLY:18703}. She reports exercising ***. She reports she is sleeping {DESC; WELL/FAIRLY WELL/POORLY:18703}. Breast complaints ***.  Mammogram: none DEXA: none Pap smear: discontinued Colonoscopy: none   There is no immunization history on file for this patient.  HPI  Lab Results  Component Value Date   NA 142 10/05/2020   K 5.0 10/05/2020   CO2 24 10/05/2020   GLUCOSE 89 10/05/2020   BUN 15 10/05/2020   CREATININE 0.97 10/05/2020   CALCIUM 9.2 10/05/2020   EGFR 71 10/05/2020   GFRNONAA 81 05/23/2020   Lab Results  Component Value Date   CHOL 157 05/23/2020   HDL 55 05/23/2020   LDLCALC 77 05/23/2020   TRIG 148 05/23/2020   CHOLHDL 2.9 05/23/2020   Lab Results  Component Value Date   TSH 1.050 05/23/2020   Lab Results  Component Value Date   HGBA1C 5.1 05/03/2017   Lab Results  Component Value Date   WBC 5.4 05/23/2020   HGB 12.9 05/23/2020   HCT 38.4 05/23/2020   MCV 92 05/23/2020   PLT 332 05/23/2020   Lab Results  Component Value Date   ALT 35 (H) 05/23/2020   AST 26 05/23/2020   ALKPHOS 106 05/23/2020   BILITOT 0.2 05/23/2020   Lab Results  Component Value Date   VD25OH 35.0 05/03/2017     Review of Systems  Patient Active Problem List   Diagnosis Date Noted   S/P TAH-BSO 05/22/2020   Recurrent major depressive disorder, in partial remission ([place]) 01/21/2019   Bariatric surgery status 12/16/2017   Sleep apnea    Psoriasis    Neuroma of foot    History of basal cell cancer    Anxiety    Chronic pain of right knee 05/18/2016   Rectocele 06/25/2011    Allergies  Allergen Reactions   Codeine Other (See Comments)    In Cough Syrup- Caused Migraines   Levonorgestrel-Ethinyl Estrad  Other (See Comments)    Molli Posey) birth control patch "made me sick"    Past Surgical History:  Procedure Laterality Date   ABDOMINAL HYSTERECTOMY     BASAL CELL CARCINOMA EXCISION  03/06/2017   On [place]     COLONOSCOPY     CYSTOSCOPY/URETEROSCOPY/HOLMIUM LASER/STENT PLACEMENT Left 04/24/2019   Procedure: CYSTOSCOPY/LEFT URETEROSCOPY AND LEFT RETROGRADE PYELOGRAM/HOLMIUM LASER/STENT PLACEMENT;  Surgeon: Billey Co, MD;  Location: [facility] ORS;  Service: Urology;  Laterality: Left;   EXCISION MORTON'S NEUROMA Right    gastric sleeve surgery  2013   with repair of hiatal hernia   REVISION OF GASTRIC SLEEVE TO BILIOPANCREATIC DIVERSION WITH DUODENAL SWITCH LAPAROSCOPIC  03/13/2018   right elbow surgery     right wrist surgery      Social History   Tobacco Use   Smoking status: Former    Packs/day: 0.50    Years: 10.00    Pack years: 5.00    Types: Cigarettes    Quit date: 1995    Years since quitting: 28.0   Smokeless tobacco: Never  Vaping Use   Vaping Use: Never used  Substance Use Topics   Alcohol use: Yes    Comment: social   Drug use: No  Medication list has been reviewed and updated.  No outpatient medications have been marked as taking for the 05/26/21 encounter (Appointment) with Glean Hess, MD.    [facility] 2/9 Scores 05/23/2020 01/21/2019 09/19/2018 04/10/2018  PHQ - 2 Score 6 4 0 0  PHQ- 9 Score 8 8 0 1    GAD 7 : Generalized Anxiety Score 05/23/2020 01/21/2019  Nervous, Anxious, on Edge 3 3  Control/stop worrying 3 3  Worry too much - different things 3 2  Trouble relaxing 3 3  Restless 0 2  Easily annoyed or irritable 3 3  Afraid - awful might happen 2 3  Total GAD 7 Score 17 19  Anxiety Difficulty Not difficult at all Somewhat difficult    BP Readings from Last 3 Encounters:  04/15/21 114/74  03/05/21 107/73  10/12/20 97/60    Physical Exam  Wt Readings from Last 3 Encounters:  04/15/21 185 lb (83.9  kg)  03/05/21 182 lb (82.6 kg)  10/12/20 182 lb (82.6 kg)    There were no vitals taken for this visit.  Assessment and Plan:

## 2021-06-12 ENCOUNTER — Telehealth: Payer: Self-pay | Admitting: Internal Medicine

## 2021-06-12 NOTE — Telephone Encounter (Signed)
Please call pt to schedule a CPE. Pt can get referral for colonoscopy at visit. Pt has not been seen in a year.  KP

## 2021-06-12 NOTE — Telephone Encounter (Signed)
Copied from [place] [id number]. Topic: Referral - Status >> Jun 12, 2021  9:59 AM Alanda Slim E wrote: Reason for CRM: Pt received a referral in December of 2012 for colonoscopy / pt tried to reschedule appt but now needs a new referral / please advise

## 2021-06-13 ENCOUNTER — Other Ambulatory Visit: Payer: Self-pay

## 2021-06-13 ENCOUNTER — Ambulatory Visit: Payer: BC Managed Care – PPO | Admitting: Internal Medicine

## 2021-06-13 ENCOUNTER — Encounter: Payer: Self-pay | Admitting: Internal Medicine

## 2021-06-13 VITALS — BP 124/60 | HR 65 | Ht 65.0 in | Wt 196.4 lb

## 2021-06-13 DIAGNOSIS — F3341 Major depressive disorder, recurrent, in partial remission: Secondary | ICD-10-CM | POA: Diagnosis not present

## 2021-06-13 DIAGNOSIS — G4733 Obstructive sleep apnea (adult) (pediatric): Secondary | ICD-10-CM | POA: Diagnosis not present

## 2021-06-13 DIAGNOSIS — Z1211 Encounter for screening for malignant neoplasm of colon: Secondary | ICD-10-CM | POA: Diagnosis not present

## 2021-06-13 DIAGNOSIS — Z1231 Encounter for screening mammogram for malignant neoplasm of breast: Secondary | ICD-10-CM | POA: Diagnosis not present

## 2021-06-13 MED ORDER — SERTRALINE HCL 100 MG PO TABS: 100.0000 mg | ORAL_TABLET | Freq: Every day | ORAL | 0 refills | Status: DC

## 2021-06-13 MED ORDER — SUTAB 1479-225-188 MG PO TABS: 12.0000 | ORAL_TABLET | Freq: Once | ORAL | 0 refills | Status: AC

## 2021-06-13 NOTE — Progress Notes (Signed)
Gastroenterology Pre-Procedure Review  Request Date: 07/14/2021 Requesting Physician: Dr. Marius Ditch  PATIENT REVIEW QUESTIONS: The patient responded to the following health history questions as indicated:    1. Are you having any GI issues? no 2. Do you have a personal history of Polyps? no 3. Do you have a family history of Colon Cancer or Polyps? yes (both family history) 4. Diabetes Mellitus? no 5. Joint replacements in the past 12 months?no 6. Major health problems in the past 3 months?no 7. Any artificial heart valves, MVP, or defibrillator?no    MEDICATIONS & ALLERGIES:    Patient reports the following regarding taking any anticoagulation/antiplatelet therapy:   Plavix, Coumadin, Eliquis, Xarelto, Lovenox, Pradaxa, Brilinta, or Effient? no Aspirin? no  Patient confirms/reports the following medications:  Current Outpatient Medications  Medication Sig Dispense Refill   albuterol (VENTOLIN HFA) 108 (90 Base) MCG/ACT inhaler TAKE 2 PUFFS BY MOUTH EVERY 6 HOURS AS NEEDED FOR WHEEZE OR SHORTNESS OF BREATH 6.7 each 2   calcium citrate-vitamin D 500-500 MG-UNIT chewable tablet Chew 1 tablet by mouth 4 (four) times daily.     Multiple Vitamins-Minerals (BARIATRIC MULTIVITAMINS/IRON PO) Take 1 tablet by mouth 2 (two) times daily.     NON FORMULARY CPAP at 10 cm H2O     oxymetazoline (AFRIN) 0.05 % nasal spray Place 1 spray into both nostrils 2 (two) times daily.     sertraline (ZOLOFT) 100 MG tablet Take 1 tablet (100 mg total) by mouth daily. 90 tablet 0   No current facility-administered medications for this visit.    Patient confirms/reports the following allergies:  Allergies  Allergen Reactions   Codeine Other (See Comments)    In Cough Syrup- Caused Migraines   Levonorgestrel-Ethinyl Estrad Other (See Comments)    Molli Posey) birth control patch "made me sick"    No orders of the defined types were placed in this encounter.   AUTHORIZATION INFORMATION Primary  Insurance: 1D#: Group #:  Secondary Insurance: 1D#: Group #:  SCHEDULE INFORMATION: Date: 07/14/2021 Time: Location:[facility]

## 2021-06-13 NOTE — Progress Notes (Signed)
Date:  06/13/2021   Name:  Meghan Weeks   DOB:  1970-11-11   MRN:  [id number]   Chief Complaint: Colon Cancer Screening  Depression        This is a chronic problem.  The problem occurs every several days.The problem is unchanged.  Associated symptoms include no fatigue and no suicidal ideas.  Treatments tried: weaned off of bupropion and never started Zoloft because she became ill in November and then never tried it.  Compliance with treatment is poor.  Lab Results  Component Value Date   NA 142 10/05/2020   K 5.0 10/05/2020   CO2 24 10/05/2020   GLUCOSE 89 10/05/2020   BUN 15 10/05/2020   CREATININE 0.97 10/05/2020   CALCIUM 9.2 10/05/2020   EGFR 71 10/05/2020   GFRNONAA 81 05/23/2020   Lab Results  Component Value Date   CHOL 157 05/23/2020   HDL 55 05/23/2020   LDLCALC 77 05/23/2020   TRIG 148 05/23/2020   CHOLHDL 2.9 05/23/2020   Lab Results  Component Value Date   TSH 1.050 05/23/2020   Lab Results  Component Value Date   HGBA1C 5.1 05/03/2017   Lab Results  Component Value Date   WBC 5.4 05/23/2020   HGB 12.9 05/23/2020   HCT 38.4 05/23/2020   MCV 92 05/23/2020   PLT 332 05/23/2020   Lab Results  Component Value Date   ALT 35 (H) 05/23/2020   AST 26 05/23/2020   ALKPHOS 106 05/23/2020   BILITOT 0.2 05/23/2020   Lab Results  Component Value Date   VD25OH 35.0 05/03/2017     Review of Systems  Constitutional:  Negative for chills, fatigue, fever and unexpected weight change.  Respiratory:  Negative for cough, chest tightness and shortness of breath.   Cardiovascular:  Negative for chest pain and palpitations.  Gastrointestinal:  Negative for abdominal pain, blood in stool, diarrhea and nausea.  Psychiatric/Behavioral:  Positive for depression, dysphoric mood and sleep disturbance. Negative for suicidal ideas. The patient is nervous/anxious.    Patient Active Problem List   Diagnosis Date Noted   Pain in joint of right hip 02/15/2021    S/P TAH-BSO 05/22/2020   Recurrent major depressive disorder, in partial remission ([place]) 01/21/2019   Bariatric surgery status 12/16/2017   Sleep apnea    Psoriasis    Neuroma of foot    History of basal cell cancer    Anxiety    Chronic pain of right knee 05/18/2016   Rectocele 06/25/2011    Allergies  Allergen Reactions   Codeine Other (See Comments)    In Cough Syrup- Caused Migraines   Levonorgestrel-Ethinyl Estrad Other (See Comments)    Molli Posey) birth control patch "made me sick"    Past Surgical History:  Procedure Laterality Date   ABDOMINAL HYSTERECTOMY     BASAL CELL CARCINOMA EXCISION  03/06/2017   On [facility]   CHOLECYSTECTOMY     COLONOSCOPY     CYSTOSCOPY/URETEROSCOPY/HOLMIUM LASER/STENT PLACEMENT Left 04/24/2019   Procedure: CYSTOSCOPY/LEFT URETEROSCOPY AND LEFT RETROGRADE PYELOGRAM/HOLMIUM LASER/STENT PLACEMENT;  Surgeon: Billey Co, MD;  Location: [facility] ORS;  Service: Urology;  Laterality: Left;   EXCISION MORTON'S NEUROMA Right    gastric sleeve surgery  2013   with repair of hiatal hernia   REVISION OF GASTRIC SLEEVE TO BILIOPANCREATIC DIVERSION WITH DUODENAL SWITCH LAPAROSCOPIC  03/13/2018   right elbow surgery     right wrist surgery      Social  History   Tobacco Use   Smoking status: Former    Packs/day: 0.50    Years: 10.00    Pack years: 5.00    Types: Cigarettes    Quit date: 1995    Years since quitting: 28.0   Smokeless tobacco: Never  Vaping Use   Vaping Use: Never used  Substance Use Topics   Alcohol use: Yes    Comment: social   Drug use: No     Medication list has been reviewed and updated.  Current Meds  Medication Sig   albuterol (VENTOLIN HFA) 108 (90 Base) MCG/ACT inhaler TAKE 2 PUFFS BY MOUTH EVERY 6 HOURS AS NEEDED FOR WHEEZE OR SHORTNESS OF BREATH   calcium citrate-vitamin D 500-500 MG-UNIT chewable tablet Chew 1 tablet by mouth 4 (four) times daily.   Multiple Vitamins-Minerals (BARIATRIC  MULTIVITAMINS/IRON PO) Take 1 tablet by mouth 2 (two) times daily.   NON FORMULARY CPAP at 10 cm H2O   oxymetazoline (AFRIN) 0.05 % nasal spray Place 1 spray into both nostrils 2 (two) times daily.    PHQ 2/9 Scores 05/23/2020 01/21/2019 09/19/2018 04/10/2018  PHQ - 2 Score 6 4 0 0  PHQ- 9 Score 8 8 0 1    GAD 7 : Generalized Anxiety Score 05/23/2020 01/21/2019  Nervous, Anxious, on Edge 3 3  Control/stop worrying 3 3  Worry too much - different things 3 2  Trouble relaxing 3 3  Restless 0 2  Easily annoyed or irritable 3 3  Afraid - awful might happen 2 3  Total GAD 7 Score 17 19  Anxiety Difficulty Not difficult at all Somewhat difficult    BP Readings from Last 3 Encounters:  06/13/21 124/60  04/15/21 114/74  03/05/21 107/73    Physical Exam Vitals and nursing note reviewed.  Constitutional:      General: She is not in acute distress.    Appearance: Normal appearance. She is well-developed.  HENT:     Head: Normocephalic and atraumatic.  Neck:     Vascular: No carotid bruit.  Cardiovascular:     Rate and Rhythm: Normal rate and regular rhythm.     Pulses: Normal pulses.  Pulmonary:     Effort: Pulmonary effort is normal. No respiratory distress.     Breath sounds: No wheezing or rhonchi.  Musculoskeletal:     Cervical back: Normal range of motion.     Right lower leg: No edema.     Left lower leg: No edema.  Lymphadenopathy:     Cervical: No cervical adenopathy.  Skin:    General: Skin is warm and dry.     Capillary Refill: Capillary refill takes less than 2 seconds.     Findings: No rash.  Neurological:     General: No focal deficit present.     Mental Status: She is alert and oriented to person, place, and time.  Psychiatric:        Attention and Perception: Attention normal.        Mood and Affect: Mood normal.        Behavior: Behavior normal.    Wt Readings from Last 3 Encounters:  06/13/21 196 lb 6.4 oz (89.1 kg)  04/15/21 185 lb (83.9 kg)   03/05/21 182 lb (82.6 kg)    BP 124/60    Pulse 65    Ht 5' 5" (1.651 m)    Wt 196 lb 6.4 oz (89.1 kg)    SpO2 99%    BMI 32.68 kg/m  Assessment and Plan: 1. Encounter for screening mammogram for breast cancer No breast issues - due for screening mammogram to be scheduled at [facility] - MM 3D SCREEN BREAST BILATERAL  2. Colon cancer screening Overdue for screening; Positive family hx - Ambulatory referral to Gastroenterology  3. Recurrent major depressive disorder, in partial remission ([place]) Depression sx continue with moderate impact on QOL. No SI/HI. Will try Zoloft again 50 mg x 1 week then 100 mg daily. - sertraline (ZOLOFT) 100 MG tablet; Take 1 tablet (100 mg total) by mouth daily.  Dispense: 90 tablet; Refill: 0  4. Obstructive sleep apnea syndrome Continues on CPAP and will follow up with Dr. Chancy Milroy   Partially dictated using Dragon software. Any errors are unintentional.  Halina Maidens, MD [facility]  06/13/2021

## 2021-06-15 DIAGNOSIS — M7061 Trochanteric bursitis, right hip: Secondary | ICD-10-CM | POA: Diagnosis not present

## 2021-06-20 ENCOUNTER — Ambulatory Visit: Payer: BC Managed Care – PPO | Admitting: Internal Medicine

## 2021-06-22 ENCOUNTER — Other Ambulatory Visit: Payer: Self-pay

## 2021-06-22 ENCOUNTER — Telehealth: Payer: Self-pay

## 2021-06-22 ENCOUNTER — Encounter: Payer: Self-pay | Admitting: Internal Medicine

## 2021-06-22 ENCOUNTER — Ambulatory Visit: Payer: BC Managed Care – PPO | Admitting: Internal Medicine

## 2021-06-22 VITALS — BP 108/72 | HR 63 | Temp 98.6°F | Resp 16 | Ht 65.0 in | Wt 194.8 lb

## 2021-06-22 DIAGNOSIS — Z7189 Other specified counseling: Secondary | ICD-10-CM

## 2021-06-22 DIAGNOSIS — G4733 Obstructive sleep apnea (adult) (pediatric): Secondary | ICD-10-CM

## 2021-06-22 NOTE — Progress Notes (Signed)
[facility] [place], [place] [address]  Pulmonary Sleep Medicine   Office Visit Note  Patient Name: Meghan Weeks DOB: [birth year] MRN [id number]  Date of Service: 06/22/2021  Complaints/HPI: OSA. She has had a [employer]ironics CPAP device. She states that it is in recall. She has been using it as prescribed. Patient has not used the machine and needs to have the CPAP device pressures set. She feels tired and sleepy and needs to have the pressures adjusted. Wakes up sometimes fatigued. She has not had a recent sleep study done. Last study was in 2010 with an AHI of 93.1  She had gastric surgery for weight loss. She has lost weight but then did gain it back  ROS  General: (-) fever, (-) chills, (-) night sweats, (-) weakness Skin: (-) rashes, (-) itching,. Eyes: (-) visual changes, (-) redness, (-) itching. Nose and Sinuses: (-) nasal stuffiness or itchiness, (-) postnasal drip, (-) nosebleeds, (-) sinus trouble. Mouth and Throat: (-) sore throat, (-) hoarseness. Neck: (-) swollen glands, (-) enlarged thyroid, (-) neck pain. Respiratory: - cough, (-) bloody sputum, - shortness of breath, - wheezing. Cardiovascular: - ankle swelling, (-) chest pain. Lymphatic: (-) lymph node enlargement. Neurologic: (-) numbness, (-) tingling. Psychiatric: (-) anxiety, (-) depression   Current Medication: Outpatient Encounter Medications as of 06/22/2021  Medication Sig   albuterol (VENTOLIN HFA) 108 (90 Base) MCG/ACT inhaler TAKE 2 PUFFS BY MOUTH EVERY 6 HOURS AS NEEDED FOR WHEEZE OR SHORTNESS OF BREATH   calcium citrate-vitamin D 500-500 MG-UNIT chewable tablet Chew 1 tablet by mouth 4 (four) times daily.   Multiple Vitamins-Minerals (BARIATRIC MULTIVITAMINS/IRON PO) Take 1 tablet by mouth 2 (two) times daily.   NON FORMULARY CPAP at 10 cm H2O   oxymetazoline (AFRIN) 0.05 % nasal spray Place 1 spray into both nostrils 2 (two) times daily.   sertraline (ZOLOFT) 100 MG tablet  Take 1 tablet (100 mg total) by mouth daily.   [DISCONTINUED] buPROPion (WELLBUTRIN XL) 150 MG 24 hr tablet TAKE 1 TABLET BY MOUTH TWICE A DAY   [DISCONTINUED] promethazine (PHENERGAN) 12.5 MG tablet Take 1 tablet (12.5 mg total) by mouth every 6 (six) hours as needed for nausea or vomiting.   No facility-administered encounter medications on file as of 06/22/2021.    Surgical History: Past Surgical History:  Procedure Laterality Date   ABDOMINAL HYSTERECTOMY     [facility]   [facility]LE[facility]     COLONOSCOPY     CYSTOSCOPY/URETEROSCOPY/HOLMIUM LASER/STENT PLACEMENT Left 04/24/2019   Procedure: CYSTOSCOPY/LEFT URETEROSCOPY AND LEFT RETROGRADE PYELOGRAM/HOLMIUM LASER/STENT PLACEMENT;  Surgeon: Billey Co, MD;  Location: [facility] ORS;  Service: Urology;  Laterality: Left;   EXCISION MORTON'S NEUROMA Right    gastric sleeve surgery  2013   with repair of hiatal hernia   REVISION OF GASTRIC SLEEVE TO BILIOPANCREATIC DIVERSION WITH DUODENAL SWITCH LAPAROSCOPIC  03/13/2018   right elbow surgery     right wrist surgery      Medical History: Past Medical History:  Diagnosis Date   ADHD    Anxiety    Bronchitis    Car sickness    Cholelithiasis    Complication of anesthesia    Depression    History of basal cell cancer    forehead and nose   Kidney atrophy    right kidney shrinking?   Neuroma of foot    PONV (postoperative nausea and vomiting)  Psoriasis    Renal stones 03/2017   right    Sleep apnea 2007 or 2008   CPAP    Family History: Family History  Problem Relation Age of Onset   Hematuria Mother    Diabetes Father    Hypertension Father    Diabetes Sister    Hypertension Sister    Colon cancer Maternal Grandmother    Uterine cancer Maternal Grandmother    Kidney cancer Neg Hx    Bladder Cancer Neg Hx     Social History: Social History   Socioeconomic History   Marital status: Married    Spouse  name: Not on file   Number of children: Not on file   Years of education: Not on file   Highest education level: Not on file  Occupational History   Not on file  Tobacco Use   Smoking status: Former    Packs/day: 0.50    Years: 10.00    Pack years: 5.00    Types: Cigarettes    Quit date: 1995    Years since quitting: 28.0   Smokeless tobacco: Never  Vaping Use   Vaping Use: Never used  Substance and Sexual Activity   Alcohol use: Yes    Comment: social   Drug use: No   Sexual activity: Not on file  Other Topics Concern   Not on file  Social History Narrative   Not on file   Social Determinants of Health   Financial Resource Strain: Not on file  Food Insecurity: Not on file  Transportation Needs: Not on file  Physical Activity: Not on file  Stress: Not on file  Social Connections: Not on file  Intimate Partner Violence: Not on file    Vital Signs: Blood pressure 108/72, pulse 63, temperature 98.6 F (37 C), resp. rate 16, height 5\' 5"  (1.651 m), weight 194 lb 12.8 oz (88.4 kg), SpO2 98 %.  Examination: General Appearance: The patient is well-developed, well-nourished, and in no distress. Skin: Gross inspection of skin unremarkable. Head: normocephalic, no gross deformities. Eyes: no gross deformities noted. ENT: ears appear grossly normal no exudates. Neck: Supple. No thyromegaly. No LAD. Respiratory: no rhonchi noted. Cardiovascular: Normal S1 and S2 without murmur or rub. Extremities: No cyanosis. pulses are equal. Neurologic: Alert and oriented. No involuntary movements.  LABS: No results found for this or any previous visit (from the past 2160 hour(s)).  Radiology: DG Chest 2 View  Result Date: 04/15/2021 CLINICAL DATA:  Pt complains of cough x 1 week with no relief from OTC medications. Pt says that cough becomes productive after using inhaler EXAM: CHEST - 2 VIEW COMPARISON:  Chest radiograph 06/11/2019 FINDINGS: The cardiomediastinal contours are  within normal limits. The lungs are clear. No pneumothorax or pleural effusion. No acute finding in the visualized skeleton. IMPRESSION: No active cardiopulmonary process. Electronically Signed   By: Audie Pinto M.D.   On: 04/15/2021 09:49    No results found.  No results found.    Assessment and Plan: Patient Active Problem List   Diagnosis Date Noted   Pain in joint of right hip 02/15/2021   S/P TAH-BSO 05/22/2020   Bariatric surgery status 12/16/2017   Sleep apnea    Psoriasis    Neuroma of foot    History of basal cell cancer    Anxiety    Chronic pain of right knee 05/18/2016   Rectocele 06/25/2011    1. OSA (obstructive sleep apnea) Patient has to get a repeat sleep  study if she is had a recalled machine by [employer] and she needs to have a new machine however there is no recent study on file - [facility] Sleep Study; Future  2. Obesity, morbid ([place]) Obesity Counseling: Had a lengthy discussion regarding patients BMI and weight issues. Patient was instructed on portion control as well as increased activity. Also discussed caloric restrictions with trying to maintain intake less than 2000 Kcal. Discussions were made in accordance with the 5As of weight management. Simple actions such as not eating late and if able to, taking a walk is suggested.   3. CPAP use counseling CPAP Counseling: had a lengthy discussion with the patient regarding the importance of PAP therapy in management of the sleep apnea. Patient appears to understand the risk factor reduction and also understands the risks associated with untreated sleep apnea. Patient will try to make a good faith effort to remain compliant with therapy. Also instructed the patient on proper cleaning of the device including the water must be changed daily if possible and use of distilled water is preferred. Patient understands that the machine should be regularly cleaned with appropriate recommended cleaning solutions that do not  damage the PAP machine for example given white vinegar and water rinses. Other methods such as ozone treatment may not be as good as these simple methods to achieve cleaning.    General Counseling: I have discussed the findings of the evaluation and examination with [facility].  I have also discussed any further diagnostic evaluation thatmay be needed or ordered today. [place] verbalizes understanding of the findings of todays visit. We also reviewed her medications today and discussed drug interactions and side effects including but not limited excessive drowsiness and altered mental states. We also discussed that there is always a risk not just to her but also people around her. she has been encouraged to call the office with any questions or concerns that should arise related to todays visit.  No orders of the defined types were placed in this encounter.    Time spent: 81  I have personally obtained a history, examined the patient, evaluated laboratory and imaging results, formulated the assessment and plan and placed orders.    Allyne Gee, MD [facility] Pulmonary and Critical Care Sleep medicine

## 2021-06-22 NOTE — Patient Instructions (Signed)
Sleep Apnea ?Sleep apnea affects breathing during sleep. It causes breathing to stop for 10 seconds or more, or to become shallow. People with sleep apnea usually snore loudly. ?It can also increase the risk of: ?Heart attack. ?Stroke. ?Being very overweight (obese). ?Diabetes. ?Heart failure. ?Irregular heartbeat. ?High blood pressure. ?The goal of treatment is to help you breathe normally again. ?What are the causes? ?The most common cause of this condition is a collapsed or blocked airway. ?There are three kinds of sleep apnea: ?Obstructive sleep apnea. This is caused by a blocked or collapsed airway. ?Central sleep apnea. This happens when the brain does not send the right signals to the muscles that control breathing. ?Mixed sleep apnea. This is a combination of obstructive and central sleep apnea. ?What increases the risk? ?Being overweight. ?Smoking. ?Having a small airway. ?Being older. ?Being female. ?Drinking alcohol. ?Taking medicines to calm yourself (sedatives or tranquilizers). ?Having family members with the condition. ?Having a tongue or tonsils that are larger than normal. ?What are the signs or symptoms? ?Trouble staying asleep. ?Loud snoring. ?Headaches in the morning. ?Waking up gasping. ?Dry mouth or sore throat in the morning. ?Being sleepy or tired during the day. ?If you are sleepy or tired during the day, you may also: ?Not be able to focus your mind (concentrate). ?Forget things. ?Get angry a lot and have mood swings. ?Feel sad (depressed). ?Have changes in your personality. ?Have less interest in sex, if you are female. ?Be unable to have an erection, if you are female. ?How is this treated? ? ?Sleeping on your side. ?Using a medicine to get rid of mucus in your nose (decongestant). ?Avoiding the use of alcohol, medicines to help you relax, or certain pain medicines (narcotics). ?Losing weight, if needed. ?Changing your diet. ?Quitting smoking. ?Using a machine to open your airway while you  sleep, such as: ?An oral appliance. This is a mouthpiece that shifts your lower jaw forward. ?A CPAP device. This device blows air through a mask when you breathe out (exhale). ?An EPAP device. This has valves that you put in each nostril. ?A BIPAP device. This device blows air through a mask when you breathe in (inhale) and breathe out. ?Having surgery if other treatments do not work. ?Follow these instructions at home: ?Lifestyle ?Make changes that your doctor recommends. ?Eat a healthy diet. ?Lose weight if needed. ?Avoid alcohol, medicines to help you relax, and some pain medicines. ?Do not smoke or use any products that contain nicotine or tobacco. If you need help quitting, ask your doctor. ?General instructions ?Take over-the-counter and prescription medicines only as told by your doctor. ?If you were given a machine to use while you sleep, use it only as told by your doctor. ?If you are having surgery, make sure to tell your doctor you have sleep apnea. You may need to bring your device with you. ?Keep all follow-up visits. ?Contact a doctor if: ?The machine that you were given to use during sleep bothers you or does not seem to be working. ?You do not get better. ?You get worse. ?Get help right away if: ?Your chest hurts. ?You have trouble breathing in enough air. ?You have an uncomfortable feeling in your back, arms, or stomach. ?You have trouble talking. ?One side of your body feels weak. ?A part of your face is hanging down. ?These symptoms may be an emergency. Get help right away. Call your local emergency services (911 in the [place] ?Do not wait to see if the symptoms   will go away. ?Do not drive yourself to the hospital. ?Summary ?This condition affects breathing during sleep. ?The most common cause is a collapsed or blocked airway. ?The goal of treatment is to help you breathe normally while you sleep. ?This information is not intended to replace advice given to you by your health care provider. Make  sure you discuss any questions you have with your health care provider. ?Document Revised: 12/21/2020 Document Reviewed: 04/22/2020 ?Elsevier Patient Education ? 2022 Elsevier Inc. ? ?

## 2021-06-22 NOTE — Telephone Encounter (Signed)
SS ordered. Printed. Put on Titania's desk-Toni

## 2021-06-27 ENCOUNTER — Other Ambulatory Visit: Payer: Self-pay

## 2021-06-27 MED ORDER — NA SULFATE-K SULFATE-MG SULF 17.5-3.13-1.6 GM/177ML PO SOLN: 354.0000 mL | Freq: Once | ORAL | 0 refills | Status: AC

## 2021-06-27 MED ORDER — GOLYTELY 236 G PO SOLR: 4000.0000 mL | Freq: Once | ORAL | 0 refills | Status: AC

## 2021-06-27 NOTE — Progress Notes (Signed)
Patient states the Nila Nephew is not covered by insurance Called in alternative to the pharmacy

## 2021-06-28 DIAGNOSIS — M7061 Trochanteric bursitis, right hip: Secondary | ICD-10-CM | POA: Diagnosis not present

## 2021-07-06 DIAGNOSIS — M7061 Trochanteric bursitis, right hip: Secondary | ICD-10-CM | POA: Diagnosis not present

## 2021-07-10 ENCOUNTER — Ambulatory Visit (INDEPENDENT_AMBULATORY_CARE_PROVIDER_SITE_OTHER): Payer: BC Managed Care – PPO

## 2021-07-10 ENCOUNTER — Ambulatory Visit: Payer: BC Managed Care – PPO | Admitting: Podiatry

## 2021-07-10 ENCOUNTER — Ambulatory Visit: Payer: BC Managed Care – PPO

## 2021-07-10 ENCOUNTER — Other Ambulatory Visit: Payer: Self-pay

## 2021-07-10 DIAGNOSIS — M25871 Other specified joint disorders, right ankle and foot: Secondary | ICD-10-CM

## 2021-07-10 DIAGNOSIS — M778 Other enthesopathies, not elsewhere classified: Secondary | ICD-10-CM

## 2021-07-10 DIAGNOSIS — M722 Plantar fascial fibromatosis: Secondary | ICD-10-CM

## 2021-07-10 MED ORDER — TRIAMCINOLONE ACETONIDE 40 MG/ML IJ SUSP
20.0000 mg | Freq: Once | INTRAMUSCULAR | Status: AC
  Administered 2021-07-10: 20 mg

## 2021-07-10 MED ORDER — MELOXICAM 15 MG PO TABS: 15.0000 mg | ORAL_TABLET | Freq: Every day | ORAL | 3 refills | Status: DC

## 2021-07-10 MED ORDER — METHYLPREDNISOLONE 4 MG PO TBPK: ORAL_TABLET | ORAL | 0 refills | Status: DC

## 2021-07-10 NOTE — Progress Notes (Signed)
Subjective:  Patient ID: Meghan Weeks, female    DOB: 1970/11/09,  MRN: [id number] HPI Chief Complaint  Patient presents with   Foot Pain    Sub 1st MPJ right - aching x several months, has tear in hips and not been walking straight, previous surgery   Foot Pain    Plantar/medial heel left - aching x 1 month, AM pain some, but not all the time   New Patient (Initial Visit)    Est pt 2019    51 y.o. female presents with the above complaint.   ROS: Denies fever chills nausea mobic muscle aches pains calf pain back pain chest pain shortness of breath.  We removed the tibial sesamoid and I medial dorsal cutaneous nerve neuroma on the right foot because of chronic sesamoid pain. Her other sesamoid hurts on the right foot.  Past Medical History:  Diagnosis Date   ADHD    Anxiety    Bronchitis    Car sickness    Cholelithiasis    Complication of anesthesia    Depression    History of basal cell cancer    forehead and nose   Kidney atrophy    right kidney shrinking?   Neuroma of foot    PONV (postoperative nausea and vomiting)    Psoriasis    Renal stones 03/2017   right    Sleep apnea 2007 or 2008   CPAP   Past Surgical History:  Procedure Laterality Date   ABDOMINAL HYSTERECTOMY     BASAL CELL CARCINOMA EXCISION  03/06/2017   On [facility]   CHOLECYSTECTOMY     COLONOSCOPY     CYSTOSCOPY/URETEROSCOPY/HOLMIUM LASER/STENT PLACEMENT Left 04/24/2019   Procedure: CYSTOSCOPY/LEFT URETEROSCOPY AND LEFT RETROGRADE PYELOGRAM/HOLMIUM LASER/STENT PLACEMENT;  Surgeon: Billey Co, MD;  Location: [facility] ORS;  Service: Urology;  Laterality: Left;   EXCISION MORTON'S NEUROMA Right    gastric sleeve surgery  2013   with repair of hiatal hernia   REVISION OF GASTRIC SLEEVE TO BILIOPANCREATIC DIVERSION WITH DUODENAL SWITCH LAPAROSCOPIC  03/13/2018   right elbow surgery     right wrist surgery      Current Outpatient Medications:    meloxicam (MOBIC) 15 MG tablet,  Take 1 tablet (15 mg total) by mouth daily., Disp: 30 tablet, Rfl: 3   methylPREDNISolone (MEDROL DOSEPAK) 4 MG TBPK tablet, 6 day dose pack - take as directed, Disp: 21 tablet, Rfl: 0   albuterol (VENTOLIN HFA) 108 (90 Base) MCG/ACT inhaler, TAKE 2 PUFFS BY MOUTH EVERY 6 HOURS AS NEEDED FOR WHEEZE OR SHORTNESS OF BREATH, Disp: 6.7 each, Rfl: 2   calcium citrate-vitamin D 500-500 MG-UNIT chewable tablet, Chew 1 tablet by mouth 4 (four) times daily., Disp: , Rfl:    Multiple Vitamins-Minerals (BARIATRIC MULTIVITAMINS/IRON PO), Take 1 tablet by mouth 2 (two) times daily., Disp: , Rfl:    NON FORMULARY, CPAP at 10 cm H2O, Disp: , Rfl:    oxymetazoline (AFRIN) 0.05 % nasal spray, Place 1 spray into both nostrils 2 (two) times daily., Disp: , Rfl:    sertraline (ZOLOFT) 100 MG tablet, Take 1 tablet (100 mg total) by mouth daily., Disp: 90 tablet, Rfl: 0  Allergies  Allergen Reactions   Codeine Other (See Comments)    In Cough Syrup- Caused Migraines   Levonorgestrel-Ethinyl Estrad Other (See Comments)    Molli Posey) birth control patch "made me sick"   Review of Systems Objective:  There were no vitals filed for this visit.  General:  Well developed, nourished, in no acute distress, alert and oriented x3   Dermatological: Skin is warm, dry and supple bilateral. Nails x 10 are well maintained; remaining integument appears unremarkable at this time. There are no open sores, no preulcerative lesions, no rash or signs of infection present.  Vascular: Dorsalis Pedis artery and Posterior Tibial artery pedal pulses are 2/4 bilateral with immedate capillary fill time. Pedal hair growth present. No varicosities and no lower extremity edema present bilateral.   Neruologic: Grossly intact via light touch bilateral. Vibratory intact via tuning fork bilateral. Protective threshold with Semmes Wienstein monofilament intact to all pedal sites bilateral. Patellar and Achilles deep tendon reflexes 2+ bilateral. No  Babinski or clonus noted bilateral.   Musculoskeletal: No gross boney pedal deformities bilateral. No pain, crepitus, or limitation noted with foot and ankle range of motion bilateral. Muscular strength 5/5 in all groups tested bilateral.  Pain on palpation of the fibular sesamoid.  But no pain with range of motion or end range of motion of the first metatarsal phalangeal joint.  There is no erythema edema cellulitis drainage or odor.  Left foot does demonstrate severe pain on palpation medial calcaneal tubercle left.  Gait: Unassisted, Nonantalgic.    Radiographs:  Radiographs taken today demonstrate osseously mature individual tibial sesamoid is missing on the right foot but no other osseous abnormalities.  Left foot demonstrate soft tissue increase in density plantar fascial cannula insertion site plantar distally right calcaneal spur.  Right foot does demonstrate corkscrew anchor to the navicular that is still intact in good position.  Assessment & Plan:   Assessment: Plantar fasciitis left fibular sesamoiditis right  Plan: Discussed etiology pathology conservative versus surgical therapies at this point I had her new orthotics made today with Aaron Edelman for her sesamoid issue.  This should also help with the plantar fasciitis.  Start her on methylprednisolone to be followed by meloxicam.  Injected her left heel 20 mg Kenalog 5 mg Marcaine placed her in a plantar fascial brace as well.  Follow-up with her in 1 month     Meghan Weeks, [place]

## 2021-07-10 NOTE — Patient Instructions (Signed)

## 2021-07-11 NOTE — Progress Notes (Signed)
SITUATION Reason for Consult: Evaluation for Bilateral Custom Foot Orthoses Patient / Caregiver Report: Patient is ready for foot orthotics  OBJECTIVE DATA: Patient History / Diagnosis:    ICD-10-CM   1. Capsulitis of right foot  M77.8     2. Plantar fasciitis of left foot  M72.2     3. Sesamoiditis of right foot  M25.871       Current or Previous Devices: Historical user  Foot Examination: Skin presentation:   Intact Ulcers & Callousing:   None and no history Toe / Foot Deformities:  Prominent 1st met Weight Bearing Presentation:  Rectus Sensation:    Intact  ORTHOTIC RECOMMENDATION Recommended Device: 1x pair of custom functional foot orthotics  GOALS OF ORTHOSES - Reduce Pain - Prevent Foot Deformity - Prevent Progression of Further Foot Deformity - Relieve Pressure - Improve the Overall Biomechanical Function of the Foot and Lower Extremity.  ACTIONS PERFORMED Patient was casted for Foot Orthoses via crush box. Procedure was explained and patient tolerated procedure well. All questions were answered and concerns addressed.  PLAN Potential out of pocket cost was communicated to patient. Casts are to be sent to [facility] for fabrication. Patient is to be called for fitting when devices are ready.

## 2021-07-14 ENCOUNTER — Ambulatory Visit
Admission: RE | Admit: 2021-07-14 | Discharge: 2021-07-14 | Disposition: A | Discharge status: Home / Self Care | Payer: BC Managed Care – PPO | Attending: Gastroenterology | Admitting: Gastroenterology

## 2021-07-14 ENCOUNTER — Other Ambulatory Visit: Payer: Self-pay

## 2021-07-14 ENCOUNTER — Encounter: Payer: Self-pay | Admitting: Gastroenterology

## 2021-07-14 ENCOUNTER — Telehealth: Payer: Self-pay

## 2021-07-14 ENCOUNTER — Encounter
Admission: RE | Disposition: A | Discharge status: Home / Self Care | Payer: Self-pay | Source: Home / Self Care | Attending: Gastroenterology

## 2021-07-14 ENCOUNTER — Ambulatory Visit: Payer: BC Managed Care – PPO | Admitting: Certified Registered Nurse Anesthetist

## 2021-07-14 DIAGNOSIS — Z1211 Encounter for screening for malignant neoplasm of colon: Secondary | ICD-10-CM | POA: Insufficient documentation

## 2021-07-14 DIAGNOSIS — G4730 Sleep apnea, unspecified: Secondary | ICD-10-CM | POA: Diagnosis not present

## 2021-07-14 DIAGNOSIS — Z87442 Personal history of urinary calculi: Secondary | ICD-10-CM | POA: Insufficient documentation

## 2021-07-14 DIAGNOSIS — F32A Depression, unspecified: Secondary | ICD-10-CM | POA: Insufficient documentation

## 2021-07-14 DIAGNOSIS — F419 Anxiety disorder, unspecified: Secondary | ICD-10-CM | POA: Insufficient documentation

## 2021-07-14 DIAGNOSIS — Z87891 Personal history of nicotine dependence: Secondary | ICD-10-CM | POA: Diagnosis not present

## 2021-07-14 DIAGNOSIS — Z85828 Personal history of other malignant neoplasm of skin: Secondary | ICD-10-CM | POA: Insufficient documentation

## 2021-07-14 DIAGNOSIS — Z86018 Personal history of other benign neoplasm: Secondary | ICD-10-CM | POA: Insufficient documentation

## 2021-07-14 DIAGNOSIS — K635 Polyp of colon: Secondary | ICD-10-CM

## 2021-07-14 DIAGNOSIS — Z8371 Family history of colonic polyps: Secondary | ICD-10-CM

## 2021-07-14 DIAGNOSIS — D122 Benign neoplasm of ascending colon: Secondary | ICD-10-CM | POA: Diagnosis not present

## 2021-07-14 DIAGNOSIS — Z83719 Family history of colon polyps, unspecified: Secondary | ICD-10-CM

## 2021-07-14 HISTORY — PX: COLONOSCOPY WITH PROPOFOL: SHX5780

## 2021-07-14 SURGERY — COLONOSCOPY WITH PROPOFOL
Anesthesia: General

## 2021-07-14 MED ORDER — PROPOFOL 500 MG/50ML IV EMUL
INTRAVENOUS | Status: DC | PRN
  Administered 2021-07-14: 140 ug/kg/min via INTRAVENOUS

## 2021-07-14 MED ORDER — SODIUM CHLORIDE 0.9 % IV SOLN: INTRAVENOUS | Status: DC

## 2021-07-14 MED ORDER — PROPOFOL 10 MG/ML IV BOLUS
INTRAVENOUS | Status: DC | PRN
  Administered 2021-07-14: 20 mg via INTRAVENOUS
  Administered 2021-07-14: 60 mg via INTRAVENOUS
  Administered 2021-07-14 (×2): 20 mg via INTRAVENOUS

## 2021-07-14 MED ORDER — LIDOCAINE HCL (CARDIAC) PF 100 MG/5ML IV SOSY
PREFILLED_SYRINGE | INTRAVENOUS | Status: DC | PRN
Start: 2021-07-14 — End: 2021-07-14
  Administered 2021-07-14: 50 mg via INTRAVENOUS

## 2021-07-14 NOTE — Transfer of Care (Signed)
Immediate Anesthesia Transfer of Care Note  Patient: Meghan Weeks  Procedure(s) Performed: COLONOSCOPY WITH PROPOFOL  Patient Location: Endoscopy Unit  Anesthesia Type:General  Level of Consciousness: drowsy  Airway & Oxygen Therapy: Patient Spontanous Breathing  Post-op Assessment: Report given to RN and Post -op Vital signs reviewed and stable  Post vital signs: Reviewed and stable  Last Vitals:  Vitals Value Taken Time  BP 98/60 07/14/21 1012  Temp 36 C 07/14/21 1012  Pulse 68 07/14/21 1012  Resp 10 07/14/21 1012  SpO2 99 % 07/14/21 1012    Last Pain:  Vitals:   07/14/21 1012  TempSrc: Temporal  PainSc: Asleep         Complications: No notable events documented.

## 2021-07-14 NOTE — H&P (Signed)
Meghan Darby, MD [address]  [place]  [place], [place] [address]  Main: [phone]  Fax: [phone] Pager: [phone]  Primary Care Physician:  Glean Hess, MD Primary Gastroenterologist:  Dr. Cephas Weeks  Pre-Procedure History & Physical: HPI:  Meghan Weeks is a 51 y.o. female is here for an colonoscopy.   Past Medical History:  Diagnosis Date   ADHD    Anxiety    Bronchitis    Car sickness    Cholelithiasis    Complication of anesthesia    Depression    History of basal cell cancer    forehead and nose   Kidney atrophy    right kidney shrinking?   Neuroma of foot    PONV (postoperative nausea and vomiting)    Psoriasis    Renal stones 03/2017   right    Sleep apnea 2007 or 2008   CPAP    Past Surgical History:  Procedure Laterality Date   ABDOMINAL HYSTERECTOMY     BASAL CELL CARCINOMA EXCISION  03/06/2017   On [facility]   CHOLECYSTECTOMY     COLONOSCOPY     CYSTOSCOPY/URETEROSCOPY/HOLMIUM LASER/STENT PLACEMENT Left 04/24/2019   Procedure: CYSTOSCOPY/LEFT URETEROSCOPY AND LEFT RETROGRADE PYELOGRAM/HOLMIUM LASER/STENT PLACEMENT;  Surgeon: Billey Co, MD;  Location: [facility] ORS;  Service: Urology;  Laterality: Left;   EXCISION MORTON'S NEUROMA Right    gastric sleeve surgery  2013   with repair of hiatal hernia   REVISION OF GASTRIC SLEEVE TO BILIOPANCREATIC DIVERSION WITH DUODENAL SWITCH LAPAROSCOPIC  03/13/2018   right elbow surgery     right wrist surgery      Prior to Admission medications   Medication Sig Start Date End Date Taking? Authorizing Provider  albuterol (VENTOLIN HFA) 108 (90 Base) MCG/ACT inhaler TAKE 2 PUFFS BY MOUTH EVERY 6 HOURS AS NEEDED FOR WHEEZE OR SHORTNESS OF BREATH 08/19/20   Glean Hess, MD  calcium citrate-vitamin D 500-500 MG-UNIT chewable tablet Chew 1 tablet by mouth 4 (four) times daily.    [provider]  meloxicam (MOBIC) 15 MG tablet Take 1 tablet (15 mg total) by  mouth daily. 07/10/21   Hyatt, Max T, DPM  methylPREDNISolone (MEDROL DOSEPAK) 4 MG TBPK tablet 6 day dose pack - take as directed 07/10/21   Hyatt, Max T, DPM  Multiple Vitamins-Minerals (BARIATRIC MULTIVITAMINS/IRON PO) Take 1 tablet by mouth 2 (two) times daily.    [provider]  NON FORMULARY CPAP at 10 cm H2O    [provider]  oxymetazoline (AFRIN) 0.05 % nasal spray Place 1 spray into both nostrils 2 (two) times daily.    [provider]  sertraline (ZOLOFT) 100 MG tablet Take 1 tablet (100 mg total) by mouth daily. 06/13/21   Glean Hess, MD  buPROPion (WELLBUTRIN XL) 150 MG 24 hr tablet TAKE 1 TABLET BY MOUTH TWICE A DAY 07/16/19 10/27/19  Glean Hess, MD  promethazine (PHENERGAN) 12.5 MG tablet Take 1 tablet (12.5 mg total) by mouth every 6 (six) hours as needed for nausea or vomiting. 04/21/19 05/16/19  Billey Co, MD    Allergies as of 06/13/2021 - Review Complete 06/13/2021  Allergen Reaction Noted   Codeine Other (See Comments) 03/28/2017   Levonorgestrel-ethinyl estrad Other (See Comments) 05/02/2017    Family History  Problem Relation Age of Onset   Hematuria Mother    Diabetes Father    Hypertension Father    Diabetes Sister    Hypertension  Sister    Colon cancer Maternal Grandmother    Uterine cancer Maternal Grandmother    Kidney cancer Neg Hx    Bladder Cancer Neg Hx     Social History   Socioeconomic History   Marital status: Married    Spouse name: Not on file   Number of children: Not on file   Years of education: Not on file   Highest education level: Not on file  Occupational History   Not on file  Tobacco Use   Smoking status: Former    Packs/day: 0.50    Years: 10.00    Pack years: 5.00    Types: Cigarettes    Quit date: 62    Years since quitting: 28.1   Smokeless tobacco: Never  Vaping Use   Vaping Use: Never used  Substance and Sexual Activity   Alcohol use: Yes    Comment: social   Drug  use: No   Sexual activity: Not on file  Other Topics Concern   Not on file  Social History Narrative   Not on file   Social Determinants of Health   Financial Resource Strain: Not on file  Food Insecurity: Not on file  Transportation Needs: Not on file  Physical Activity: Not on file  Stress: Not on file  Social Connections: Not on file  Intimate Partner Violence: Not on file    Review of Systems: See HPI, otherwise negative ROS  Physical Exam: BP 107/83    Pulse 65    Temp (!) 96.8 F (36 C) (Temporal)    Resp 16    Ht 5\' 5"  (1.651 m)    Wt 88.9 kg    SpO2 98%    BMI 32.62 kg/m  General:   Alert,  pleasant and cooperative in NAD Head:  Normocephalic and atraumatic. Neck:  Supple; no masses or thyromegaly. Lungs:  Clear throughout to auscultation.    Heart:  Regular rate and rhythm. Abdomen:  Soft, nontender and nondistended. Normal bowel sounds, without guarding, and without rebound.   Neurologic:  Alert and  oriented x4;  grossly normal neurologically.  Impression/Plan: Meghan Weeks is here for an colonoscopy to be performed for colon cancer screening  Risks, benefits, limitations, and alternatives regarding  colonoscopy have been reviewed with the patient.  Questions have been answered.  All parties agreeable.   Sherri Sear, MD  07/14/2021, 9:37 AM

## 2021-07-14 NOTE — Telephone Encounter (Signed)
Casts Sent to [facility]  °

## 2021-07-14 NOTE — Anesthesia Postprocedure Evaluation (Signed)
Anesthesia Post Note  Patient: Meghan Weeks  Procedure(s) Performed: COLONOSCOPY WITH PROPOFOL  Patient location during evaluation: Endoscopy Anesthesia Type: General Level of consciousness: awake and alert Pain management: pain level controlled Vital Signs Assessment: post-procedure vital signs reviewed and stable Respiratory status: spontaneous breathing, nonlabored ventilation, respiratory function stable and patient connected to nasal cannula oxygen Cardiovascular status: blood pressure returned to baseline and stable Postop Assessment: no apparent nausea or vomiting Anesthetic complications: no   No notable events documented.   Last Vitals:  Vitals:   07/14/21 1022 07/14/21 1032  BP: 101/70 104/73  Pulse: 65 (!) 58  Resp: 17 11  Temp:    SpO2: 98% 100%    Last Pain:  Vitals:   07/14/21 1032  TempSrc:   PainSc: 0-No pain                 Arita Miss

## 2021-07-14 NOTE — Anesthesia Procedure Notes (Signed)
Date/Time: 07/14/2021 9:44 AM Performed by: Lily Peer, Calayah Guadarrama, CRNA Pre-anesthesia Checklist: Patient identified, Emergency Drugs available, Suction available, Patient being monitored and Timeout performed Patient Re-evaluated:Patient Re-evaluated prior to induction Oxygen Delivery Method: Nasal cannula Induction Type: IV induction

## 2021-07-14 NOTE — Anesthesia Preprocedure Evaluation (Signed)
Anesthesia Evaluation  Patient identified by MRN, date of birth, ID band Patient awake    Reviewed: Allergy & Precautions, NPO status , Patient's Chart, lab work & pertinent test results  History of Anesthesia Complications (+) PONV and history of anesthetic complications  Airway Mallampati: II  TM Distance: >3 FB Neck ROM: Full    Dental no notable dental hx. (+) Teeth Intact   Pulmonary sleep apnea , neg COPD, Patient abstained from smoking.Not current smoker, former smoker,  Gets bronchitis often   Pulmonary exam normal breath sounds clear to auscultation       Cardiovascular Exercise Tolerance: Good METS(-) hypertension(-) CAD and (-) Past MI negative cardio ROS  (-) dysrhythmias  Rhythm:Regular Rate:Normal - Systolic murmurs    Neuro/Psych PSYCHIATRIC DISORDERS Anxiety Depression negative neurological ROS     GI/Hepatic neg GERD  ,(+)     (-) substance abuse  ,   Endo/Other  neg diabetes  Renal/GU negative Renal ROS     Musculoskeletal   Abdominal   Peds  Hematology   Anesthesia Other Findings Past Medical History: No date: ADHD No date: Anxiety No date: Bronchitis No date: Car sickness No date: Cholelithiasis No date: Complication of anesthesia No date: Depression No date: History of basal cell cancer     Comment:  forehead and nose No date: Kidney atrophy     Comment:  right kidney shrinking? No date: Neuroma of foot No date: PONV (postoperative nausea and vomiting) No date: Psoriasis 03/2017: Renal stones     Comment:  right  2007 or 2008: Sleep apnea     Comment:  CPAP  Reproductive/Obstetrics                             Anesthesia Physical Anesthesia Plan  ASA: 2  Anesthesia Plan: General   Post-op Pain Management: Minimal or no pain anticipated   Induction: Intravenous  PONV Risk Score and Plan: 4 or greater and Propofol infusion, TIVA and  Ondansetron  Airway Management Planned: Nasal Cannula  Additional Equipment: None  Intra-op Plan:   Post-operative Plan:   Informed Consent: I have reviewed the patients History and Physical, chart, labs and discussed the procedure including the risks, benefits and alternatives for the proposed anesthesia with the patient or authorized representative who has indicated his/her understanding and acceptance.     Dental advisory given  Plan Discussed with: CRNA and Surgeon  Anesthesia Plan Comments: (Discussed risks of anesthesia with patient, including possibility of difficulty with spontaneous ventilation under anesthesia necessitating airway intervention, PONV, and rare risks such as cardiac or respiratory or neurological events, and allergic reactions. Discussed the role of CRNA in patient's perioperative care. Patient understands.)        Anesthesia Quick Evaluation

## 2021-07-14 NOTE — Op Note (Signed)
[facility] Gastroenterology Patient Name: Meghan Weeks Procedure Date: 07/14/2021 9:34 AM MRN: [id number] Account #: [id number] Date of Birth: 1970/12/26 Admit Type: Outpatient Age: 51 Room: [facility] ENDO ROOM 2 Gender: Female Note Status: Finalized Instrument Name: Park Meo [id number] Procedure:             Colonoscopy Indications:           Colon cancer screening in patient at increased risk:                         Family history of 1st-degree relative with colon                         polyps before age 85 years, Colon cancer screening in                         patient with 1st-degree relative having advanced                         adenoma of the colon before age 22 Providers:             Tonita Bills Raeanne Gathers MD, MD Referring MD:          Halina Maidens, MD (Referring MD) Medicines:             General Anesthesia Complications:         No immediate complications. Estimated blood loss: None. Procedure:             Pre-Anesthesia Assessment:                        - Prior to the procedure, a History and Physical was                         performed, and patient medications and allergies were                         reviewed. The patient is competent. The risks and                         benefits of the procedure and the sedation options and                         risks were discussed with the patient. All questions                         were answered and informed consent was obtained.                         Patient identification and proposed procedure were                         verified by the physician, the nurse, the                         anesthesiologist, the anesthetist and the technician                         in the pre-procedure area in the procedure room in the  endoscopy suite. Mental Status Examination: alert and                         oriented. Airway Examination: normal oropharyngeal                         airway  and neck mobility. Respiratory Examination:                         clear to auscultation. CV Examination: normal.                         Prophylactic Antibiotics: The patient does not require                         prophylactic antibiotics. Prior Anticoagulants: The                         patient has taken no previous anticoagulant or                         antiplatelet agents. ASA Grade Assessment: III - A                         patient with severe systemic disease. After reviewing                         the risks and benefits, the patient was deemed in                         satisfactory condition to undergo the procedure. The                         anesthesia plan was to use general anesthesia.                         Immediately prior to administration of medications,                         the patient was re-assessed for adequacy to receive                         sedatives. The heart rate, respiratory rate, oxygen                         saturations, blood pressure, adequacy of pulmonary                         ventilation, and response to care were monitored                         throughout the procedure. The physical status of the                         patient was re-assessed after the procedure.                        After obtaining informed consent, the colonoscope was  passed under direct vision. Throughout the procedure,                         the patient's blood pressure, pulse, and oxygen                         saturations were monitored continuously. The                         Colonoscope was introduced through the anus and                         advanced to the the terminal ileum, with                         identification of the appendiceal orifice and IC                         valve. The colonoscopy was performed with difficulty                         due to significant looping. Successful completion of                          the procedure was aided by applying abdominal                         pressure. The patient tolerated the procedure well.                         The quality of the bowel preparation was evaluated                         using the BBPS [facility] Bowel Preparation Scale) with                         scores of: Right Colon = 2 (minor amount of residual                         staining, small fragments of stool and/or opaque                         liquid, but mucosa seen well), Transverse Colon = 3                         (entire mucosa seen well with no residual staining,                         small fragments of stool or opaque liquid) and Left                         Colon = 3 (entire mucosa seen well with no residual                         staining, small fragments of stool or opaque liquid).  The total BBPS score equals 8. Findings:      The perianal and digital rectal examinations were normal. Pertinent       negatives include normal sphincter tone and no palpable rectal lesions.      The terminal ileum appeared normal.      A 7 mm polyp was found in the ascending colon. The polyp was sessile.       The polyp was removed with a cold snare. Resection and retrieval were       complete. Estimated blood loss: none.      The retroflexed view of the distal rectum and anal verge was normal and       showed no anal or rectal abnormalities.      The exam was otherwise without abnormality.      Copious quantities of liquid stool was found in the entire colon,       precluding visualization. Lavage of the area was performed using 50 -       200 mL of sterile water, resulting in clearance with fair visualization. Impression:            - The examined portion of the ileum was normal.                        - One 7 mm polyp in the ascending colon, removed with                         a cold snare. Resected and retrieved.                        - The distal rectum and anal  verge are normal on                         retroflexion view.                        - The examination was otherwise normal.                        - Stool in the entire examined colon. Recommendation:        - Discharge patient to home (with escort).                        - Resume previous diet today.                        - Continue present medications.                        - Await pathology results.                        - Repeat colonoscopy in 5 years for surveillance. Procedure Code(s):     --- Professional ---                        [phone], Colonoscopy, flexible; with removal of                         tumor(s), polyp(s), or other lesion(s) by snare  technique Diagnosis Code(s):     --- Professional ---                        K63.5, Polyp of colon                        Z83.71, Family history of colonic polyps CPT copyright 2019 American Medical Association. All rights reserved. The codes documented in this report are preliminary and upon coder review may  be revised to meet current compliance requirements. Dr. Ulyess Mort Lin Landsman MD, MD 07/14/2021 10:16:50 AM This report has been signed electronically. Number of Addenda: 0 Note Initiated On: 07/14/2021 9:34 AM Scope Withdrawal Time: 0 hours 17 minutes 41 seconds  Total Procedure Duration: 0 hours 21 minutes 40 seconds  Estimated Blood Loss:  Estimated blood loss: none.      [facility]

## 2021-07-17 ENCOUNTER — Encounter: Payer: Self-pay | Admitting: Gastroenterology

## 2021-07-17 ENCOUNTER — Telehealth: Payer: Self-pay

## 2021-07-17 LAB — SURGICAL PATHOLOGY

## 2021-07-17 NOTE — Telephone Encounter (Signed)
Patient scheduled for psg on 08/04/21 @ [facility] tat

## 2021-07-18 ENCOUNTER — Ambulatory Visit: Payer: BC Managed Care – PPO | Admitting: Internal Medicine

## 2021-07-18 ENCOUNTER — Encounter: Payer: Self-pay | Admitting: Internal Medicine

## 2021-07-18 ENCOUNTER — Other Ambulatory Visit: Payer: Self-pay

## 2021-07-18 VITALS — BP 122/70 | HR 74 | Temp 98.0°F | Resp 16 | Ht 65.0 in | Wt 196.0 lb

## 2021-07-18 NOTE — Progress Notes (Signed)
Patient did not have her study and is scheduled for March 10 will schedule for followup after that

## 2021-07-18 NOTE — Patient Instructions (Signed)
Sleep Apnea ?Sleep apnea affects breathing during sleep. It causes breathing to stop for 10 seconds or more, or to become shallow. People with sleep apnea usually snore loudly. ?It can also increase the risk of: ?Heart attack. ?Stroke. ?Being very overweight (obese). ?Diabetes. ?Heart failure. ?Irregular heartbeat. ?High blood pressure. ?The goal of treatment is to help you breathe normally again. ?What are the causes? ?The most common cause of this condition is a collapsed or blocked airway. ?There are three kinds of sleep apnea: ?Obstructive sleep apnea. This is caused by a blocked or collapsed airway. ?Central sleep apnea. This happens when the brain does not send the right signals to the muscles that control breathing. ?Mixed sleep apnea. This is a combination of obstructive and central sleep apnea. ?What increases the risk? ?Being overweight. ?Smoking. ?Having a small airway. ?Being older. ?Being female. ?Drinking alcohol. ?Taking medicines to calm yourself (sedatives or tranquilizers). ?Having family members with the condition. ?Having a tongue or tonsils that are larger than normal. ?What are the signs or symptoms? ?Trouble staying asleep. ?Loud snoring. ?Headaches in the morning. ?Waking up gasping. ?Dry mouth or sore throat in the morning. ?Being sleepy or tired during the day. ?If you are sleepy or tired during the day, you may also: ?Not be able to focus your mind (concentrate). ?Forget things. ?Get angry a lot and have mood swings. ?Feel sad (depressed). ?Have changes in your personality. ?Have less interest in sex, if you are female. ?Be unable to have an erection, if you are female. ?How is this treated? ? ?Sleeping on your side. ?Using a medicine to get rid of mucus in your nose (decongestant). ?Avoiding the use of alcohol, medicines to help you relax, or certain pain medicines (narcotics). ?Losing weight, if needed. ?Changing your diet. ?Quitting smoking. ?Using a machine to open your airway while you  sleep, such as: ?An oral appliance. This is a mouthpiece that shifts your lower jaw forward. ?A CPAP device. This device blows air through a mask when you breathe out (exhale). ?An EPAP device. This has valves that you put in each nostril. ?A BIPAP device. This device blows air through a mask when you breathe in (inhale) and breathe out. ?Having surgery if other treatments do not work. ?Follow these instructions at home: ?Lifestyle ?Make changes that your doctor recommends. ?Eat a healthy diet. ?Lose weight if needed. ?Avoid alcohol, medicines to help you relax, and some pain medicines. ?Do not smoke or use any products that contain nicotine or tobacco. If you need help quitting, ask your doctor. ?General instructions ?Take over-the-counter and prescription medicines only as told by your doctor. ?If you were given a machine to use while you sleep, use it only as told by your doctor. ?If you are having surgery, make sure to tell your doctor you have sleep apnea. You may need to bring your device with you. ?Keep all follow-up visits. ?Contact a doctor if: ?The machine that you were given to use during sleep bothers you or does not seem to be working. ?You do not get better. ?You get worse. ?Get help right away if: ?Your chest hurts. ?You have trouble breathing in enough air. ?You have an uncomfortable feeling in your back, arms, or stomach. ?You have trouble talking. ?One side of your body feels weak. ?A part of your face is hanging down. ?These symptoms may be an emergency. Get help right away. Call your local emergency services (911 in the [place] ?Do not wait to see if the symptoms   will go away. ?Do not drive yourself to the hospital. ?Summary ?This condition affects breathing during sleep. ?The most common cause is a collapsed or blocked airway. ?The goal of treatment is to help you breathe normally while you sleep. ?This information is not intended to replace advice given to you by your health care provider. Make  sure you discuss any questions you have with your health care provider. ?Document Revised: 12/21/2020 Document Reviewed: 04/22/2020 ?Elsevier Patient Education ? 2022 Elsevier Inc. ? ?

## 2021-07-19 ENCOUNTER — Ambulatory Visit
Admission: RE | Admit: 2021-07-19 | Discharge: 2021-07-19 | Disposition: A | Discharge status: Home / Self Care | Payer: BC Managed Care – PPO | Source: Ambulatory Visit | Attending: Internal Medicine | Admitting: Internal Medicine

## 2021-07-19 DIAGNOSIS — Z1231 Encounter for screening mammogram for malignant neoplasm of breast: Secondary | ICD-10-CM | POA: Insufficient documentation

## 2021-07-20 ENCOUNTER — Other Ambulatory Visit: Payer: Self-pay | Admitting: Internal Medicine

## 2021-07-20 DIAGNOSIS — N63 Unspecified lump in unspecified breast: Secondary | ICD-10-CM

## 2021-07-20 DIAGNOSIS — R928 Other abnormal and inconclusive findings on diagnostic imaging of breast: Secondary | ICD-10-CM

## 2021-07-26 DIAGNOSIS — M7061 Trochanteric bursitis, right hip: Secondary | ICD-10-CM | POA: Diagnosis not present

## 2021-07-27 ENCOUNTER — Inpatient Hospital Stay: Admission: RE | Admit: 2021-07-27 | Payer: BC Managed Care – PPO | Source: Ambulatory Visit

## 2021-07-28 DIAGNOSIS — M25551 Pain in right hip: Secondary | ICD-10-CM | POA: Diagnosis not present

## 2021-08-04 ENCOUNTER — Encounter (INDEPENDENT_AMBULATORY_CARE_PROVIDER_SITE_OTHER): Payer: BC Managed Care – PPO | Admitting: Internal Medicine

## 2021-08-04 DIAGNOSIS — G4719 Other hypersomnia: Secondary | ICD-10-CM

## 2021-08-07 ENCOUNTER — Encounter: Payer: Self-pay | Admitting: Podiatry

## 2021-08-07 ENCOUNTER — Other Ambulatory Visit: Payer: Self-pay

## 2021-08-07 ENCOUNTER — Ambulatory Visit: Payer: BC Managed Care – PPO | Admitting: Podiatry

## 2021-08-07 DIAGNOSIS — M722 Plantar fascial fibromatosis: Secondary | ICD-10-CM

## 2021-08-07 DIAGNOSIS — M778 Other enthesopathies, not elsewhere classified: Secondary | ICD-10-CM

## 2021-08-07 DIAGNOSIS — M25871 Other specified joint disorders, right ankle and foot: Secondary | ICD-10-CM

## 2021-08-07 MED ORDER — TRIAMCINOLONE ACETONIDE 40 MG/ML IJ SUSP
20.0000 mg | Freq: Once | INTRAMUSCULAR | Status: AC
  Administered 2021-08-07: 20 mg

## 2021-08-07 MED ORDER — DEXAMETHASONE SODIUM PHOSPHATE 120 MG/30ML IJ SOLN
2.0000 mg | Freq: Once | INTRAMUSCULAR | Status: AC
  Administered 2021-08-07: 2 mg via INTRA_ARTICULAR

## 2021-08-07 NOTE — Progress Notes (Signed)
She presents today for follow-up of her Planter fasciitis left and sesamoiditis right first metatarsophalangeal joint states that everything was going well until Saturday and she felt the pain that just ran across there.  He states that is still much better than it was but it did start back and it has become painful since.  She still has tenderness in the joint around the first metatarsal phalangeal joint. ? ?Objective: Vital signs are stable alert and oriented x3.  Pulses are palpable bilateral.  She has pain to palpation MucoClear tubercle of the left heel but not nearly as symptomatic as it was previously.  She also has tenderness on end range of motion and palpation of the dorsal lateral joint consistent with functional hallux limitus.  She has tenderness on dorsiflexion and plantarflexion of the toe.  She also has tenderness on palp on palpation of the sesamoid apparatus. ? ?Assessment: Sesamoiditis capsulitis of the first metatarsal phalangeal joint.  Also Planter fasciitis left. ? ?Plan: After sterile Betadine skin prep inject 20 mg Kenalog 5 mg Marcaine point maximal tenderness left heel.  Also injected 2 mg of dexamethasone into the first metatarsal phalangeal joint right foot.  We discussed appropriate shoe gear and I like to follow-up with her in about 3 to 4 weeks. ?

## 2021-08-09 ENCOUNTER — Other Ambulatory Visit: Payer: Self-pay

## 2021-08-09 ENCOUNTER — Ambulatory Visit (INDEPENDENT_AMBULATORY_CARE_PROVIDER_SITE_OTHER): Payer: BC Managed Care – PPO

## 2021-08-09 DIAGNOSIS — G4733 Obstructive sleep apnea (adult) (pediatric): Secondary | ICD-10-CM | POA: Diagnosis not present

## 2021-08-09 NOTE — Procedures (Signed)
? ?[place]  ?Polysomnogram Report ?Part I ? ? ?   ?                                                           Phone: [phone] ?Fax: [phone] ? ?Patient Name: Meghan Weeks, Mcnicholas Acquisition Number: [id number]  ?Date of Birth: [birth year] Acquisition Date: 08/04/2021  ?Referring Physician: Devona Konig, MD    ? ?History: The patient is a 51 year old female who was referred for reevaluation of obstructive sleep apnea. Medical History: OSA, ADHD, depression, anxiety, morbid obesity, bronchitis. ? ?Medications: Afrin, albuterol, calcium citrate, multivitamin. ? ?Procedure: This routine overnight polysomnogram was performed on the [facility] 5 using the standard diagnostic protocol. This included 6 channels of EEG, 2 channels of EOG, chin EMG, bilateral anterior tibialis EMG, nasal/oral thermistor, PTAF (nasal pressure transducer), chest and abdominal wall movements, EKG, and pulse oximetry. ? ?Description: The total recording time was 451.1 minutes. The total sleep time was 397.6 minutes. There were a total of 15.5 minutes of wakefulness after sleep onset for a slightly reducedsleep efficiency of 88.1%. The latency to sleep onset was prolonged at 38.0 minutes. The R sleep onset latency was prolonged at 214.0 minutes. Sleep parameters, as a percentage of the total sleep time, demonstrated 3.6% of sleep was in N1 sleep, 74.7% N2, 0.0% N3 and 21.6% R sleep. There were a total of 76 arousals for an arousal index of 11.5 arousals per hour of sleep that was within normal limits. ? ?Respiratory monitoring demonstrated severe snoring. There were 58 apneas and hypopneas for an Apnea Hypopnea Index of 8.8 apneas and hypopneas per hour of sleep. The REM related apnea hypopnea index was 0.7/hr of REM sleep compared to a NREM AHI of 11.0/hr. The Respiratory Disturbance Index, which includes 34 respiratory effort related arousals (RERAs), was 13.9 respiratory events per hour of sleep.  The average duration of the respiratory  events was 23.3 seconds with a maximum duration of 75.5 seconds. The respiratory events occurred exclusively in the supine position with an AHI of 10.9. The respiratory events were associated with peripheral oxygen desaturations on the average to 93%. The lowest oxygen desaturation associated with a respiratory event was 91%. Additionally, the baseline oxygen saturation during wakefulness was 95%, during NREM sleep averaged 94%, and during REM sleep averaged  94%. The total duration of oxygen < 90% was 0.0 minutes. ? ?Cardiac monitoring- did not demonstrate transient cardiac decelerations associated with the apneas. There were no significant cardiac rhythm irregularities.  ? ?Periodic limb movement monitoring- demonstrated that there were 160 periodic limb movements for a periodic limb movement index of 24.1 periodic limb movements per hour of sleep.  ? ? ? ?Impression: ?This routine overnight polysomnogram confirmed the presence of significant, position-dependent obstructive sleep apnea with an overall Apnea Hypopnea Index of 8.8 apneas and hypopneas per hour of sleep, which increased to 10.9 in the supine position. The lowest desaturation to 91%.   ? ?There was a significantly elevated periodic limb movement index of 24.1 periodic limb movements per hour of sleep. Sometimes these limb movements subside once the apnea is controlled. Clinical correlation is suggested. ? ?There was a slightly reduced sleep efficiency with no slow wave sleep. These findings would appear to be due to the combination of obstructive sleep  apnea and periodic limb movements.  ? ?Recommendations:    ?A CPAP titration would be recommended for the sleep apnea. Some supine sleep should be ensured to optimize the titration. ?Would recommend weight loss in a patient with a BMI of 32.6. ? ? ? ?Allyne Gee, MD, FCCP ?Diplomate ABMS-Pulmonary, Critical Care and Sleep Medicine  ?Electronically reviewed and digitally signed ? ? ?[place] ?Polysomnogram Report ?Part II ? ?Phone: [phone] ?Fax: [phone] ? ?Patient last name Weeks Neck Size 14.0 in. Acquisition [phone]  ?Patient first name Meghan Weight 196.0 lbs. Started 08/04/2021 at 10:10:47 PM  ?Birth date 1971-04-24 Height 65.0 in. Stopped 08/05/2021 at 5:49:59 AM  ?Age 83 BMI 32.6 lb/in2 Duration 451.1  ?Study Type Adult      ?Jaclynn Major, RPSGT  Reviewed by: Richelle Ito Saunders Glance, PhD, ABSM, FAASM ?Sleep Data: ?Lights Out: 10:15:17 PM Sleep Onset: 10:53:17 PM  ?Lights On: 5:46:23 AM Sleep Efficiency: 88.1 %  ?Total Recording Time: 451.1 min Sleep Latency (from Lights Off) 38.0 min  ?Total Sleep Time (TST): 397.6 min R Latency (from Sleep Onset): 214.0 min  ?Sleep Period Time: 413.1 min Total number of awakenings: 8  ?Wake during sleep: 15.5 min Wake After Sleep Onset (WASO): 15.5 min  ? ?Sleep Data:         Arousal Summary: ?Stage  ?Latency from lights out (min) Latency from sleep onset (min) Duration (min) % Total Sleep Time  ?Normal values  ?N 1 38.0 0.0 14.5 3.6 (5%)  ?N 2 41.0 3.0 297.1 74.7 (50%)  ?N 3       0.0 0.0 (20%)  ?R 252.0 214.0 86.0 21.6 (25%)  ? ? Number Index  ?Spontaneous 52 7.8  ?Apneas & Hypopneas 10 1.5  ?RERAs 34 5.1  ?     (Apneas & Hypopneas & RERAs)  (44) (6.6)  ?Limb Movement 13 2.0  ?Snore 0 0.0  ?TOTAL 109 16.4  ? ? ? ?Respiratory Data: ? CA OA MA Apnea Hypopnea* A+ H RERA Total  ?Number 0 49 0 49 9 58 34 92  ?Mean Dur (sec) 0.0 13.2 0.0 13.2 47.7 18.6 31.3 23.3  ?Max Dur (sec) 0.0 28.0 0.0 28.0 75.5 75.5 72.5 75.5  ?Total Dur (min) 0.0 10.8 0.0 10.8 7.2 18.0 17.7 35.7  ?% of TST 0.0 2.7 0.0 2.7 1.8 4.5 4.5 9.0  ?Index (#/h TST) 0.0 7.4 0.0 7.4 1.4 8.8 5.1 13.9  ?*Hypopneas scored based on 4% or greater desaturation. ? ?Sleep Stage:       ? REM NREM TST  ?AHI 0.7 11.0 8.8  ?RDI 0.7 17.5 13.9  ? ? ? ? ? ? ? ? ? ?Body Position Data: ? Sleep ?(min) TST ?(%) REM ?(min) NREM ?(min) CA ?(#) OA ?(#) MA ?(#) HYP ?(#) AHI ?(#/h) RERA ?(#) RDI ?(#/h) Desat ?(#)   ?Supine 319.6 80.38 86.0 233.6 0 49 0 9 10.9 34 17.3 45  ?Non-Supine 78.00 19.62 0.00 78.00 0.00 0.00 0.00 0.00 0.00 0 0.00 3.00  ?Left: 78.0 19.62 0.0 78.0 0 0 0 0 0.0 0 0.00 3  ?  ? ?Snoring: ?Total number of snoring episodes  0  ?Total time with snoring    min (   % of sleep)  ? ?Oximetry Distribution: ?            WK REM NREM TOTAL  ?Average (%)   95 94 94 94  ?< 90% 0.0 0.0 0.0 0.0  ?< 80% 0.0 0.0 0.0  0.0  ?< 70% 0.0 0.0 0.0 0.0  ?# of Desaturations* 1 6 75 91  ?Desat Index (#/hour) 1.2 5.6 7.5 7.2  ?Desat Max (%) '5 3 10 10  '$ ?Desat Max Dur (sec) 7.0 94.0 113.0 113.0  ?Approx Min O2 during sleep 89  ?Approx min O2 during a respiratory event 91  ?Was Oxygen added (Y/N) and final rate No:   0 LPM  ?*Desaturations based on 3% or greater drop from baseline. ? ? Cheyne Stokes Breathing: None Present  ? ?Heart Rate Summary:  ?Average Heart Rate During Sleep 59.0 bpm      ?Highest Heart Rate During Sleep (95th %) 69.0 bpm      ?Highest Heart Rate During Sleep 90 bpm      ?Highest Heart Rate During Recording (TIB) 97 bpm      ? ?Heart Rate Observations: ?Event Type # Events   ?Bradycardia 0 Lowest HR Scored: N/A  ?Sinus Tachycardia During Sleep 0 Highest HR Scored: N/A  ?Narrow Complex Tachycardia 0 Highest HR Scored: N/A  ?Wide Complex Tachycardia 0 Highest HR Scored: N/A  ?Asystole 0 Longest Pause: N/A  ?Atrial Fibrillation 0 Duration Longest Event: N/A  ?Other Arrythmias  No Type:   ? ?Periodic Limb Movement Data: (Primary legs unless otherwise noted) ?Total # Limb Movement 163 Limb Movement Index 24.6  ?Total # PLMS 160 PLMS Index 24.1  ?Total # PLMS Arousals 11 PLMS Arousal Index 1.7  ?Percentage Sleep Time with PLMS 90.21mn (22.7 % sleep)  ?Mean Duration limb movements (secs) 600.8  ? ? ? ? ? ?

## 2021-08-09 NOTE — Progress Notes (Signed)
Pt received new replacement cpap from [employer]. She wanted to verify it was set correctly and it was, she will call me with any other questions or problems. ? ?  ?Pt was seen by Claiborne Billings  RRT/RCP  from [facility]  ?

## 2021-08-14 ENCOUNTER — Other Ambulatory Visit: Payer: Self-pay

## 2021-08-14 ENCOUNTER — Ambulatory Visit
Admission: RE | Admit: 2021-08-14 | Discharge: 2021-08-14 | Disposition: A | Discharge status: Home / Self Care | Payer: BC Managed Care – PPO | Source: Ambulatory Visit | Attending: Internal Medicine | Admitting: Internal Medicine

## 2021-08-14 DIAGNOSIS — N63 Unspecified lump in unspecified breast: Secondary | ICD-10-CM | POA: Diagnosis not present

## 2021-08-14 DIAGNOSIS — R928 Other abnormal and inconclusive findings on diagnostic imaging of breast: Secondary | ICD-10-CM | POA: Insufficient documentation

## 2021-08-14 DIAGNOSIS — N6310 Unspecified lump in the right breast, unspecified quadrant: Secondary | ICD-10-CM | POA: Diagnosis not present

## 2021-08-15 ENCOUNTER — Ambulatory Visit: Payer: BC Managed Care – PPO | Admitting: Internal Medicine

## 2021-08-29 ENCOUNTER — Ambulatory Visit: Payer: BC Managed Care – PPO | Admitting: Internal Medicine

## 2021-08-29 DIAGNOSIS — Z0289 Encounter for other administrative examinations: Secondary | ICD-10-CM

## 2021-09-04 ENCOUNTER — Encounter: Payer: Self-pay | Admitting: Podiatry

## 2021-09-04 ENCOUNTER — Ambulatory Visit: Payer: BC Managed Care – PPO | Admitting: Podiatry

## 2021-09-04 DIAGNOSIS — L03031 Cellulitis of right toe: Secondary | ICD-10-CM | POA: Diagnosis not present

## 2021-09-04 DIAGNOSIS — L603 Nail dystrophy: Secondary | ICD-10-CM | POA: Diagnosis not present

## 2021-09-04 DIAGNOSIS — G5782 Other specified mononeuropathies of left lower limb: Secondary | ICD-10-CM | POA: Diagnosis not present

## 2021-09-04 DIAGNOSIS — B351 Tinea unguium: Secondary | ICD-10-CM | POA: Diagnosis not present

## 2021-09-04 DIAGNOSIS — M722 Plantar fascial fibromatosis: Secondary | ICD-10-CM | POA: Diagnosis not present

## 2021-09-04 DIAGNOSIS — M778 Other enthesopathies, not elsewhere classified: Secondary | ICD-10-CM

## 2021-09-04 DIAGNOSIS — G5791 Unspecified mononeuropathy of right lower limb: Secondary | ICD-10-CM

## 2021-09-04 MED ORDER — TRIAMCINOLONE ACETONIDE 40 MG/ML IJ SUSP
20.0000 mg | Freq: Once | INTRAMUSCULAR | Status: AC
  Administered 2021-09-04: 20 mg

## 2021-09-04 NOTE — Progress Notes (Signed)
She presents today for follow-up of her Planter fasciitis which is resolving to her left heel.  States that she still having some radiating pain beneath the tibial sesamoid.  She is also here to take a sample of the toenails. ? ?Objective: Vital signs stable alert oriented x3 she has pain on palpation third interdigital space of her left foot after massage therapist because the top of her foot to start to be sore.  Pulses are palpable there is mild edema overlying the dorsal lateral aspect of the left foot at all of the other joints appear to be normal.  She has tenderness on palpation of the heel but much less than previously noted.  She also has tenderness on palpation beneath the sesamoid where a nerve was resected previously. ? ?Assessment: Neuroma third interdigital space left foot resolving Planter fasciitis left.  Neuritis possibly stump neuroma developing beneath the sesamoid.  And elongated thick yellow dystrophic clinically mycotic hallux nails. ? ?Plan: Sample of nails today for pathologic evaluation also injected her first dose of dehydrated alcohol to the tibial sesamoid area and injected 10 mg of Kenalog to the third interdigital space of the left foot.  Follow-up with her in about a month for pathology results.  She will also receive her second injection at that time and evaluation of the neuroma. ?

## 2021-09-05 ENCOUNTER — Encounter: Payer: Self-pay | Admitting: Internal Medicine

## 2021-09-05 ENCOUNTER — Telehealth: Payer: Self-pay

## 2021-09-05 NOTE — Telephone Encounter (Signed)
Patient scheduled for cpap titration on 09/08/21 @ [facility]tat ?

## 2021-09-08 ENCOUNTER — Encounter (INDEPENDENT_AMBULATORY_CARE_PROVIDER_SITE_OTHER): Payer: BC Managed Care – PPO | Admitting: Internal Medicine

## 2021-09-08 DIAGNOSIS — G4733 Obstructive sleep apnea (adult) (pediatric): Secondary | ICD-10-CM | POA: Diagnosis not present

## 2021-09-12 NOTE — Procedures (Signed)
? ? ? ? ?[place]  ?Polysomnogram Report ?Part I ? ?Phone: [phone] ?Fax: [phone] ? ?Patient Name: Meghan Weeks, Meghan Weeks Acquisition Number: [id number]  ?Date of Birth: Jul 08, 1970 Acquisition Date: 09/08/2021  ?Referring Physician: Allyne Gee, MD    ? ?History: The patient is a 51 year old female with obstructive sleep apnea for CPAP titration. Medical History: OSA, ADHD, depression, anxiety, morbid obesity, bronchitis. ? ?Medications: Afrin, albuterol, calcium citrate, a multivitamin, Zoloft, and Celebrex ? ?Procedure: This routine overnight polysomnogram was performed on the [facility] 5 using the standard CPAP protocol. This included 6 channels of EEG, 2 channels of EOG, chin EMG, bilateral anterior tibialis EMG, nasal/oral thermistor, PTAF (nasal pressure transducer), chest and abdominal wall movements, EKG, and pulse oximetry. ? ?Description: The total recording time was 410.1 minutes. The total sleep time was 343.5 minutes. There were a total of 59.7 minutes of wakefulness after sleep onset for a reducedsleep efficiency of 83.8%. The latency to sleep onset was shortat 6.9 minutes. The R sleep onset latency was prolonged at 263.0 minutes. Sleep parameters, as a percentage of the total sleep time, demonstrated 12.8% of sleep was in N1 sleep, 43.8% N2, 31.3% N3 and 12.1% R sleep. There were a total of 121 arousals for an arousal index of 21.1 arousals per hour of sleep that was elevated. ? ?Overall, there were a total of 13 respiratory events for a respiratory disturbance index, which includes apneas, hypopneas and RERAs (increased respiratory effort) of 2.3 respiratory events per hour of sleep during the pressure titration. CPAP was initiated at 5 cm H2O at lights out, 10:36:24 PM. It was titrated in 1 cm increments for obstructive events and flow limitation  to the final pressure of 9 cm H2O. The apnea was well controlled at the final pressure and supine, REM sleep was observed. ? ?Additionally,  the baseline oxygen saturation during wakefulness was 97%, during NREM sleep averaged 97%, and during REM sleep averaged 97%. The total duration of oxygen < 90% was 0.3 minutes. ? ?Cardiac monitoring- There were no significant cardiac rhythm irregularities.  ? ?Periodic limb movement monitoring- demonstrated that there were 25 periodic limb movements for a periodic limb movement index of 4.4 periodic limb movements per hour of sleep.  ? ?Impression: ?This patient's obstructive sleep apnea demonstrated significant improvement with the utilization of nasal CPAP at 9 cm H2O.  ? ?There were few periodic limb movements that commonly are not significant. Clinical correlation would be suggested.  ? ?Recommendations: ?Would recommend utilization of nasal CPAP at 9 cm H2O.      ?A  ResMed AirFit N20 nasal mask, size medium , was used. Chin strap used during study-No . Humidifier used during study- Yes.  ? ? ? ?Allyne Gee, MD, FCCP ?Diplomate ABMS-Pulmonary, Critical Care and Sleep Medicine  ?Electronically reviewed and digitally signed ? ?Meghan Weeks ?CPAP/BIPAP Polysomnogram Report ?Part II ?Phone: [phone] ?Fax: [phone] ? ?Patient last name Mccleary Neck Size 14.0 in. Acquisition [phone]  ?Patient first name Meghan Weeks Weight 196.0 lbs. Started 09/08/2021 at 10:28:48 PM  ?Birth date 07/31/1970 Height 65.0 in. Stopped 09/09/2021 at 5:32:06 AM  ?Age 69      ?Type Adult BMI 32.6 lb/in2 Duration 410.1  ?Meghan Weeks, Meghan Weeks  Reviewed by: Meghan Ito Saunders Glance, PhD, ABSM, FAASM ?Sleep Data: ?Lights Out: 10:36:24 PM Sleep Onset: 10:43:18 PM  ?Lights On: 5:26:30 AM Sleep Efficiency: 83.8 %  ?Total Recording Time: 410.1 min Sleep Latency (from Lights Off) 6.9 min  ?  Total Sleep Time (TST): 343.5 min R Latency (from Sleep Onset): 263.0 min  ?Sleep Period Time: 402.0 min Total number of awakenings: 20  ?Wake during sleep: 58.5 min Wake After Sleep Onset (WASO): 59.7 min  ? ?Sleep Data:         Arousal Summary: ?Stage  ?Latency  from lights out (min) Latency from sleep onset (min) Duration (min) % Total Sleep Time  ?Normal values  ?N 1 6.9 0.0 44.0 12.8 (5%)  ?N 2 14.4 7.5 150.5 43.8 (50%)  ?N 3 32.9 26.0 107.5 31.3 (20%)  ?R 269.9 263.0 41.5 12.1 (25%)  ? ? Number Index  ?Spontaneous 91 15.9  ?Apneas & Hypopneas 10 1.7  ?RERAs 1 0.2  ?     (Apneas & Hypopneas & RERAs)  (11) (1.9)  ?Limb Movement 20 3.5  ?Snore 0 0.0  ?TOTAL 122 21.3  ? ? ? ?Respiratory Data: ? CA OA MA Apnea Hypopnea* A+ H RERA Total  ?Number 0 4 0 '4 8 12 1 13  '$ ?Mean Dur (sec) 0.0 17.5 0.0 17.5 21.0 19.8 19.5 19.8  ?Max Dur (sec) 0.0 29.5 0.0 29.5 31.5 31.5 19.5 31.5  ?Total Dur (min) 0.0 1.2 0.0 1.2 2.8 4.0 0.3 4.3  ?% of TST 0.0 0.3 0.0 0.3 0.8 1.2 0.1 1.2  ?Index (#/h TST) 0.0 0.7 0.0 0.7 1.4 2.1 0.2 2.3  ?*Hypopneas scored based on 4% or greater desaturation. ? ?Sleep Stage:     ? ? ? ? REM NREM TST  ?AHI 1.4 2.0 2.1  ?RDI 1.4 2.2 2.3  ? ? Sleep ?(min) TST ?(%) REM ?(min) NREM ?(min) CA ?(#) OA ?(#) MA ?(#) HYP ?(#) AHI ?(#/h) RERA ?(#) RDI ?(#/h) Desat ?(#)  ?Supine 121.5 35.37 32.0 89.5 0 1 0 6 3.5 0 3.5 8  ?Non-Supine 222.00 64.63 9.50 212.50 0.00 3.00 0.00 2.00 1.35 1.00 1.62 4.00  ?Left: 157.5 45.85 9.5 148.0 0 0 0 2 0.8 0 0.8 3  ?Right: 64.5 18.78 0.0 64.5 0 3 0 0 2.8 1 3.7 1  ?  ? ?Snoring: ?Total number of snoring episodes  0  ?Total time with snoring    min (   % of sleep)  ? ?Oximetry Distribution: ?            WK REM NREM TOTAL  ?Average (%)   97 97 97 97  ?< 90% 0.3 0.0 0.0 0.3  ?< 80% 0.0 0.0 0.0 0.0  ?< 70% 0.0 0.0 0.0 0.0  ?# of Desaturations* '2 1 9 12  '$ ?Desat Index (#/hour) 2.0 1.4 1.8 2.1  ?Desat Max (%) '4 4 4 4  '$ ?Desat Max Dur (sec) 15.0 22.0 51.0 51.0  ?Approx Min O2 during sleep 90  ?Approx min O2 during a respiratory event 93  ?Was Oxygen added (Y/N) and final rate No:   0 LPM  ?*Desaturations based on 3% or greater drop from baseline. ? ? Cheyne Stokes Breathing: None Present  ? ?Heart Rate Summary:  ?Average Heart Rate During Sleep 50.6 bpm       ?Highest Heart Rate During Sleep (95th %) 56.0 bpm      ?Highest Heart Rate During Sleep 108 bpm      ?Highest Heart Rate During Recording (TIB) 210 bpm (artifact)  ? ?Heart Rate Observations: ?Event Type # Events   ?Bradycardia 0 Lowest HR Scored: N/A  ?Sinus Tachycardia During Sleep 0 Highest HR Scored: N/A  ?Narrow Complex Tachycardia 0 Highest HR Scored: N/A  ?Wide  Complex Tachycardia 0 Highest HR Scored: N/A  ?Asystole 0 Longest Pause: N/A  ?Atrial Fibrillation 0 Duration Longest Event: N/A  ?Other Arrythmias  No Type:   ?Periodic Limb Movement Data: (Primary legs unless otherwise noted) ?Total # Limb Movement 37 Limb Movement Index 6.5  ?Total # PLMS 25 PLMS Index 4.4  ?Total # PLMS Arousals 12 PLMS Arousal Index 2.1  ?Percentage Sleep Time with PLMS 14.26mn (4.3 % sleep)  ?Mean Duration limb movements (secs) 223.4  ? ? ?IPAP Level ?(cmH2O) EPAP Level ?(cmH2O) Total Duration (min) Sleep Duration (min) Sleep (%) REM (%) CA  #) OA # MA # HYP #) AHI ?(#/hr) RERAs # RERAs (#/hr) RDI (#/hr)  ?5 5 68.7 62.2 90.5 0.0 0 1 0 0 1.0 0 0.0 1.0  ?6 6 105.7 70.3 66.5 0.0 0 3 0 3 5.1 1 0.9 6.0  ?7 7 72.6 71.6 98.6 0.0 0 0 0 0 0.0 0 0.0 0.0  ?8 8 83.5 73.5 88.0 11.4 0 0 0 4 3.3 0 0.0 3.3  ?9 9 64.5 64.5 100.0 49.6 0 0 0 1 0.9 0 0.0 0.9  ?                                           ?                                           ?                                           ?                                           ?                                           ?                                           ?                                           ?                                           ?                                           ?                                           ?                                           ?                                           ?                                           ?                                           ?                                           ?                                            ? ? ? ? ? ? ? ?

## 2021-09-13 ENCOUNTER — Encounter: Payer: Self-pay | Admitting: Internal Medicine

## 2021-09-13 DIAGNOSIS — M5416 Radiculopathy, lumbar region: Secondary | ICD-10-CM | POA: Diagnosis not present

## 2021-09-21 DIAGNOSIS — M5416 Radiculopathy, lumbar region: Secondary | ICD-10-CM | POA: Diagnosis not present

## 2021-09-26 ENCOUNTER — Other Ambulatory Visit: Payer: Self-pay | Admitting: Internal Medicine

## 2021-09-26 DIAGNOSIS — F3341 Major depressive disorder, recurrent, in partial remission: Secondary | ICD-10-CM

## 2021-09-27 DIAGNOSIS — M5416 Radiculopathy, lumbar region: Secondary | ICD-10-CM | POA: Diagnosis not present

## 2021-09-27 NOTE — Telephone Encounter (Signed)
Requested medication (s) are due for refill today: yes ? ?Requested medication (s) are on the active medication list: yes ? ?Last refill:  06/13/21 ? ?Future visit scheduled: yes ? ?Notes to clinic:  over due lab work ? ? ?Requested Prescriptions  ?Pending Prescriptions Disp Refills  ? sertraline (ZOLOFT) 100 MG tablet [Pharmacy Med Name: SERTRALINE HCL 100 MG TABLET] 90 tablet 0  ?  Sig: TAKE 1 TABLET BY MOUTH EVERY DAY  ?  ? Psychiatry:  Antidepressants - SSRI - sertraline Failed - 09/26/2021  6:26 PM  ?  ?  Failed - AST in normal range and within 360 days  ?  AST  ?Date Value Ref Range Status  ?05/23/2020 26 0 - 40 IU/L Final  ?  ?  ?  ?  Failed - ALT in normal range and within 360 days  ?  ALT  ?Date Value Ref Range Status  ?05/23/2020 35 (H) 0 - 32 IU/L Final  ?  ?  ?  ?  Passed - Completed PHQ-2 or PHQ-9 in the last 360 days  ?  ?  Passed - Valid encounter within last 6 months  ?  Recent Outpatient Visits   ? ?      ? 3 months ago Encounter for screening mammogram for breast cancer  ? [facility] Glean Hess, MD  ? 1 year ago Annual physical exam  ? [facility] Glean Hess, MD  ? 2 years ago Annual physical exam  ? [facility] Glean Hess, MD  ? 3 years ago Environmental and seasonal allergies  ? [facility] Glean Hess, MD  ? 3 years ago Recurrent major depressive disorder, in partial remission ([place])  ? [facility] Glean Hess, MD  ? ?  ?  ?Future Appointments   ? ?        ? In 2 months [facility] Melia Jesse Sans, MD [facility] [place]  ? ?  ? ? ?  ?  ?  ? ? ? ? ?

## 2021-10-05 DIAGNOSIS — J320 Chronic maxillary sinusitis: Secondary | ICD-10-CM | POA: Diagnosis not present

## 2021-10-05 DIAGNOSIS — J310 Chronic rhinitis: Secondary | ICD-10-CM | POA: Diagnosis not present

## 2021-10-05 DIAGNOSIS — J342 Deviated nasal septum: Secondary | ICD-10-CM | POA: Diagnosis not present

## 2021-10-11 ENCOUNTER — Ambulatory Visit (INDEPENDENT_AMBULATORY_CARE_PROVIDER_SITE_OTHER): Payer: BC Managed Care – PPO | Admitting: Podiatry

## 2021-10-11 ENCOUNTER — Encounter: Payer: Self-pay | Admitting: Podiatry

## 2021-10-11 DIAGNOSIS — Z79899 Other long term (current) drug therapy: Secondary | ICD-10-CM

## 2021-10-11 DIAGNOSIS — L603 Nail dystrophy: Secondary | ICD-10-CM

## 2021-10-11 DIAGNOSIS — M25871 Other specified joint disorders, right ankle and foot: Secondary | ICD-10-CM | POA: Diagnosis not present

## 2021-10-11 DIAGNOSIS — M722 Plantar fascial fibromatosis: Secondary | ICD-10-CM

## 2021-10-11 MED ORDER — TRIAMCINOLONE ACETONIDE 40 MG/ML IJ SUSP
20.0000 mg | Freq: Once | INTRAMUSCULAR | Status: AC
  Administered 2021-10-11: 20 mg

## 2021-10-11 NOTE — Progress Notes (Signed)
She presents today for follow-up of her left heel.  States that it flared up again and the sesamoidal injection seem to help.  The neuroma to the left foot also helped last time.  She is also here for follow-up of her nail pathology.  She would like to pick up her orthotics but was told that they are not in yet. ? ?Objective: Vital signs are stable she is alert and oriented x3.  Pulses are palpable.  Much decrease in symptomatology on palpation of the sesamoids after the first dehydrated alcohol injection and similarly to the third interdigital space of the contralateral foot after her first Kenalog injection to the neuroma.  She does have pain on palpation medial calcaneal tubercle of her left heel. ? ?Pathology does demonstrate saprophytic fungus. ? ?Assessment onychomycosis.  Resolving neuroma left.  Neuritis with the sesamoids right foot.  And Planter fasciitis left heel. ? ?Plan: Injected left heel today 20 mg Kenalog 5 mg Marcaine point maximal tenderness.  Injected another dose of dehydrated alcohol to the most painful area at the sesamoids on the right foot.  I also discussed the need for either oral or laser therapy.  At this point she is wants to do laser therapy in [place].  We will follow-up with her once those orthotics come in ?

## 2021-10-12 DIAGNOSIS — L400 Psoriasis vulgaris: Secondary | ICD-10-CM | POA: Diagnosis not present

## 2021-10-12 DIAGNOSIS — L570 Actinic keratosis: Secondary | ICD-10-CM | POA: Diagnosis not present

## 2021-10-12 DIAGNOSIS — X32XXXA Exposure to sunlight, initial encounter: Secondary | ICD-10-CM | POA: Diagnosis not present

## 2021-10-12 DIAGNOSIS — D2272 Melanocytic nevi of left lower limb, including hip: Secondary | ICD-10-CM | POA: Diagnosis not present

## 2021-10-12 DIAGNOSIS — D485 Neoplasm of uncertain behavior of skin: Secondary | ICD-10-CM | POA: Diagnosis not present

## 2021-10-12 DIAGNOSIS — D2339 Other benign neoplasm of skin of other parts of face: Secondary | ICD-10-CM | POA: Diagnosis not present

## 2021-10-13 DIAGNOSIS — M5416 Radiculopathy, lumbar region: Secondary | ICD-10-CM | POA: Diagnosis not present

## 2021-10-17 DIAGNOSIS — J342 Deviated nasal septum: Secondary | ICD-10-CM | POA: Diagnosis not present

## 2021-10-18 ENCOUNTER — Encounter: Payer: BC Managed Care – PPO | Admitting: Podiatry

## 2021-10-19 ENCOUNTER — Ambulatory Visit (INDEPENDENT_AMBULATORY_CARE_PROVIDER_SITE_OTHER): Payer: Self-pay

## 2021-10-19 DIAGNOSIS — L603 Nail dystrophy: Secondary | ICD-10-CM

## 2021-10-19 DIAGNOSIS — B351 Tinea unguium: Secondary | ICD-10-CM

## 2021-10-19 NOTE — Progress Notes (Signed)
Patient presents today for the 1st laser treatment. Diagnosed with mycotic nail infection by Dr. Milinda Pointer.   Toenail most affected Right hallux.  All other systems are negative.  Nails were filed thin. Laser therapy was administered to 1st toenails bilateral and patient tolerated the treatment well. All safety precautions were in place.    Follow up in 4 weeks for laser # 2.

## 2021-10-19 NOTE — Patient Instructions (Signed)

## 2021-10-27 DIAGNOSIS — M5416 Radiculopathy, lumbar region: Secondary | ICD-10-CM | POA: Diagnosis not present

## 2021-11-06 DIAGNOSIS — J301 Allergic rhinitis due to pollen: Secondary | ICD-10-CM | POA: Diagnosis not present

## 2021-11-06 DIAGNOSIS — J310 Chronic rhinitis: Secondary | ICD-10-CM | POA: Diagnosis not present

## 2021-11-06 DIAGNOSIS — J342 Deviated nasal septum: Secondary | ICD-10-CM | POA: Diagnosis not present

## 2021-11-06 DIAGNOSIS — R42 Dizziness and giddiness: Secondary | ICD-10-CM | POA: Diagnosis not present

## 2021-11-07 ENCOUNTER — Other Ambulatory Visit: Payer: Self-pay | Admitting: Podiatry

## 2021-11-07 NOTE — Telephone Encounter (Signed)
Please advise,duplicate therapy

## 2021-11-08 ENCOUNTER — Encounter: Payer: BC Managed Care – PPO | Admitting: Podiatry

## 2021-11-09 DIAGNOSIS — R42 Dizziness and giddiness: Secondary | ICD-10-CM | POA: Diagnosis not present

## 2021-11-20 ENCOUNTER — Other Ambulatory Visit: Payer: BC Managed Care – PPO

## 2021-11-24 DIAGNOSIS — B078 Other viral warts: Secondary | ICD-10-CM | POA: Diagnosis not present

## 2021-11-24 DIAGNOSIS — L718 Other rosacea: Secondary | ICD-10-CM | POA: Diagnosis not present

## 2021-11-24 DIAGNOSIS — L538 Other specified erythematous conditions: Secondary | ICD-10-CM | POA: Diagnosis not present

## 2021-11-29 DIAGNOSIS — M7061 Trochanteric bursitis, right hip: Secondary | ICD-10-CM | POA: Diagnosis not present

## 2021-11-30 ENCOUNTER — Ambulatory Visit (INDEPENDENT_AMBULATORY_CARE_PROVIDER_SITE_OTHER): Payer: BC Managed Care – PPO | Admitting: Podiatry

## 2021-11-30 DIAGNOSIS — L603 Nail dystrophy: Secondary | ICD-10-CM

## 2021-11-30 DIAGNOSIS — B351 Tinea unguium: Secondary | ICD-10-CM

## 2021-11-30 NOTE — Progress Notes (Signed)
Patient presents today for the 2nd laser treatment. Diagnosed with mycotic nail infection by Dr. Milinda Pointer.    Toenail most affected right hallux.   All other systems are negative.   Nails were filed thin. Laser therapy was administered to 1st toenails bilateral and patient tolerated the treatment well. All safety precautions were in place.      Follow up in 4 weeks for laser # 3.

## 2021-12-11 ENCOUNTER — Encounter: Payer: Self-pay | Admitting: Podiatry

## 2021-12-11 ENCOUNTER — Ambulatory Visit: Payer: BC Managed Care – PPO | Admitting: Podiatry

## 2021-12-11 DIAGNOSIS — D2372 Other benign neoplasm of skin of left lower limb, including hip: Secondary | ICD-10-CM

## 2021-12-11 DIAGNOSIS — G5791 Unspecified mononeuropathy of right lower limb: Secondary | ICD-10-CM

## 2021-12-11 NOTE — Progress Notes (Signed)
She presents today complaining of a painful poor keratoma to the fourth digit left foot as well as some fifth metatarsal left foot.  Continues to wear her orthotics on a regular basis.  She states that after the last injection her right forefoot where she had her chaplains neuroma resected is doing very well.  She states that it has recurred to some degree but not horribly.  She has noticed some pulling sensation where we repaired her posterior tibial tendon years ago.  Objective: Vital signs are stable alert oriented x3 posterior tibial tendon does have some edema around it but there is no fluctuance within the tendon sheath.  Pulses are palpable.  She has mild reproducible pain on palpation of the surgical site at the metatarsal phalangeal joint first right and benign skin lesions fourth toe and left forefoot.  Assessment: Benign skin lesions.  Painful neuroma.  Early tendinitis possibly.  Plan: Chemical destruction benign lesions to be washed off thoroughly tomorrow Cantharone was applied today under occlusion.  1.5 cc of 4% dehydrated alcohol to the point of maximal tenderness plantar medial aspect of the first metatarsal phalangeal joint.  Follow-up with her in 1 month.  We need to get her orthotics from the [place] office.

## 2021-12-13 ENCOUNTER — Encounter: Payer: Self-pay | Admitting: Internal Medicine

## 2021-12-13 ENCOUNTER — Ambulatory Visit (INDEPENDENT_AMBULATORY_CARE_PROVIDER_SITE_OTHER): Payer: BC Managed Care – PPO | Admitting: Internal Medicine

## 2021-12-13 VITALS — BP 110/70 | HR 60 | Ht 65.0 in | Wt 212.0 lb

## 2021-12-13 DIAGNOSIS — R600 Localized edema: Secondary | ICD-10-CM

## 2021-12-13 DIAGNOSIS — F3341 Major depressive disorder, recurrent, in partial remission: Secondary | ICD-10-CM

## 2021-12-13 DIAGNOSIS — Z0000 Encounter for general adult medical examination without abnormal findings: Secondary | ICD-10-CM | POA: Diagnosis not present

## 2021-12-13 DIAGNOSIS — B009 Herpesviral infection, unspecified: Secondary | ICD-10-CM

## 2021-12-13 DIAGNOSIS — N6001 Solitary cyst of right breast: Secondary | ICD-10-CM

## 2021-12-13 MED ORDER — VALACYCLOVIR HCL 1 G PO TABS: 1000.0000 mg | ORAL_TABLET | Freq: Two times a day (BID) | ORAL | 0 refills | Status: DC

## 2021-12-13 MED ORDER — HYDROCHLOROTHIAZIDE 12.5 MG PO CAPS: 12.5000 mg | ORAL_CAPSULE | Freq: Every day | ORAL | 0 refills | Status: DC | PRN

## 2021-12-13 MED ORDER — SERTRALINE HCL 100 MG PO TABS: 100.0000 mg | ORAL_TABLET | Freq: Every day | ORAL | 3 refills | Status: DC

## 2021-12-13 NOTE — Progress Notes (Signed)
Date:  12/13/2021   Name:  Meghan Weeks   DOB:  1970-11-22   MRN:  [id number]   Chief Complaint: Annual Exam (Breast exam no pap ) and Herpes Zoster (Outbreaks on rectum intermittently and on vagina. Needs medications as needed. ) STEFAN KAREN is a 51 y.o. female who presents today for her Complete Annual Exam. She feels poorly. She reports exercising none. She reports she is sleeping fairly well. Breast complaints - none.  Mammogram: 06/2021 right cyst - f/u 6 mo DEXA: none Pap smear: discontinued Colonoscopy: 06/2021 repeat 5 yrs  Health Maintenance Due  Topic Date Due   TETANUS/TDAP  Never done     There is no immunization history on file for this patient.  Anxiety Presents for follow-up visit. Patient reports no chest pain, dizziness, nervous/anxious behavior, palpitations or shortness of breath. Symptoms occur occasionally. The quality of sleep is good.   Compliance with medications is 76-100%.   Hip pain - has a gluteal tear and tight iliotibial bands.  Surgery planned for August first.  Lab Results  Component Value Date   NA 142 10/05/2020   K 5.0 10/05/2020   CO2 24 10/05/2020   GLUCOSE 89 10/05/2020   BUN 15 10/05/2020   CREATININE 0.97 10/05/2020   CALCIUM 9.2 10/05/2020   EGFR 71 10/05/2020   GFRNONAA 81 05/23/2020   Lab Results  Component Value Date   CHOL 157 05/23/2020   HDL 55 05/23/2020   LDLCALC 77 05/23/2020   TRIG 148 05/23/2020   CHOLHDL 2.9 05/23/2020   Lab Results  Component Value Date   TSH 1.050 05/23/2020   Lab Results  Component Value Date   HGBA1C 5.1 05/03/2017   Lab Results  Component Value Date   WBC 5.4 05/23/2020   HGB 12.9 05/23/2020   HCT 38.4 05/23/2020   MCV 92 05/23/2020   PLT 332 05/23/2020   Lab Results  Component Value Date   ALT 35 (H) 05/23/2020   AST 26 05/23/2020   ALKPHOS 106 05/23/2020   BILITOT 0.2 05/23/2020   Lab Results  Component Value Date   VD25OH 35.0 05/03/2017     Review of  Systems  Constitutional:  Negative for chills, fatigue and fever.  HENT:  Negative for congestion, hearing loss, tinnitus, trouble swallowing and voice change.   Eyes:  Negative for visual disturbance.  Respiratory:  Negative for cough, chest tightness, shortness of breath and wheezing.   Cardiovascular:  Positive for leg swelling. Negative for chest pain and palpitations.  Gastrointestinal:  Negative for abdominal pain, constipation, diarrhea and vomiting.  Endocrine: Negative for polydipsia and polyuria.  Genitourinary:  Negative for dysuria, frequency, genital sores, vaginal bleeding and vaginal discharge.  Musculoskeletal:  Positive for arthralgias, gait problem and myalgias. Negative for joint swelling.  Skin:  Negative for color change and rash.  Neurological:  Negative for dizziness, tremors, light-headedness and headaches.  Hematological:  Negative for adenopathy. Does not bruise/bleed easily.  Psychiatric/Behavioral:  Negative for dysphoric mood and sleep disturbance. The patient is not nervous/anxious.     Patient Active Problem List   Diagnosis Date Noted   Polyp of ascending colon    Family history of colonic polyps    Pain in joint of right hip 02/15/2021   S/P TAH-BSO 05/22/2020   Recurrent major depressive disorder, in partial remission ([place]) 01/21/2019   Bariatric surgery status 12/16/2017   Sleep apnea    Psoriasis    Neuroma of foot  History of basal cell cancer    Chronic pain of right knee 05/18/2016   Rectocele 06/25/2011    Allergies  Allergen Reactions   Codeine Other (See Comments)    In Cough Syrup- Caused Migraines   Levonorgestrel-Ethinyl Estrad Other (See Comments)    Molli Posey) birth control patch "made me sick"    Past Surgical History:  Procedure Laterality Date   ABDOMINAL HYSTERECTOMY     BASAL CELL CARCINOMA EXCISION  03/06/2017   On [facility]   CHOLECYSTECTOMY     COLONOSCOPY     COLONOSCOPY WITH PROPOFOL N/A 07/14/2021    Procedure: COLONOSCOPY WITH PROPOFOL;  Surgeon: Lin Landsman, MD;  Location: [facility] ENDOSCOPY;  Service: Gastroenterology;  Laterality: N/A;   CYSTOSCOPY/URETEROSCOPY/HOLMIUM LASER/STENT PLACEMENT Left 04/24/2019   Procedure: CYSTOSCOPY/LEFT URETEROSCOPY AND LEFT RETROGRADE PYELOGRAM/HOLMIUM LASER/STENT PLACEMENT;  Surgeon: Billey Co, MD;  Location: [facility] ORS;  Service: Urology;  Laterality: Left;   EXCISION MORTON'S NEUROMA Right    gastric sleeve surgery  2013   with repair of hiatal hernia   REVISION OF GASTRIC SLEEVE TO BILIOPANCREATIC DIVERSION WITH DUODENAL SWITCH LAPAROSCOPIC  03/13/2018   right elbow surgery     right wrist surgery     TOTAL ABDOMINAL HYSTERECTOMY  2006    Social History   Tobacco Use   Smoking status: Former    Packs/day: 0.50    Years: 10.00    Total pack years: 5.00    Types: Cigarettes    Quit date: 1995    Years since quitting: 28.5   Smokeless tobacco: Never  Vaping Use   Vaping Use: Never used  Substance Use Topics   Alcohol use: Yes    Comment: social   Drug use: No     Medication list has been reviewed and updated.  Current Meds  Medication Sig   albuterol (VENTOLIN HFA) 108 (90 Base) MCG/ACT inhaler TAKE 2 PUFFS BY MOUTH EVERY 6 HOURS AS NEEDED FOR WHEEZE OR SHORTNESS OF BREATH   celecoxib (CELEBREX) 100 MG capsule Take 100 mg by mouth 2 (two) times daily.   hydrochlorothiazide (MICROZIDE) 12.5 MG capsule Take 1 capsule (12.5 mg total) by mouth daily as needed.   metroNIDAZOLE (METROGEL) 0.75 % gel Apply topically 2 (two) times daily.   Multiple Vitamins-Minerals (BARIATRIC MULTIVITAMINS/IRON PO) Take 1 tablet by mouth 2 (two) times daily.   NON FORMULARY CPAP at 10 cm H2O   valACYclovir (VALTREX) 1000 MG tablet Take 1 tablet (1,000 mg total) by mouth 2 (two) times daily for 10 days.       12/13/2021    9:20 AM 06/13/2021    1:25 PM 05/23/2020    9:17 AM 01/21/2019    9:32 AM  GAD 7 : Generalized Anxiety Score  Nervous,  Anxious, on Edge '1 1 3 3  ' Control/stop worrying '1 1 3 3  ' Worry too much - different things '1 1 3 2  ' Trouble relaxing '1 1 3 3  ' Restless 1 0 0 2  Easily annoyed or irritable '2 2 3 3  ' Afraid - awful might happen 0 0 2 3  Total GAD 7 Score '7 6 17 19  ' Anxiety Difficulty Somewhat difficult Somewhat difficult Not difficult at all Somewhat difficult       12/13/2021    9:20 AM 06/13/2021    1:25 PM 05/23/2020    9:17 AM  Depression screen PHQ 2/9  Decreased Interest '2 2 3  ' Down, Depressed, Hopeless '1 1 3  ' PHQ -  2 Score '3 3 6  ' Altered sleeping 1 1 0  Tired, decreased energy '2 2 1  ' Change in appetite 3 2 0  Feeling bad or failure about yourself  0 0 0  Trouble concentrating '1 1 1  ' Moving slowly or fidgety/restless 1 0 0  Suicidal thoughts 0 0 0  PHQ-9 Score '11 9 8  ' Difficult doing work/chores Very difficult Not difficult at all     BP Readings from Last 3 Encounters:  12/13/21 110/70  07/18/21 122/70  07/14/21 104/73    Physical Exam Vitals and nursing note reviewed.  Constitutional:      General: She is not in acute distress.    Appearance: She is well-developed.  HENT:     Head: Normocephalic and atraumatic.     Right Ear: Tympanic membrane and ear canal normal.     Left Ear: Tympanic membrane and ear canal normal.     Nose:     Right Sinus: No maxillary sinus tenderness.     Left Sinus: No maxillary sinus tenderness.  Eyes:     General: No scleral icterus.       Right eye: No discharge.        Left eye: No discharge.     Conjunctiva/sclera: Conjunctivae normal.  Neck:     Thyroid: No thyromegaly.     Vascular: No carotid bruit.  Cardiovascular:     Rate and Rhythm: Normal rate and regular rhythm.     Pulses: Normal pulses.     Heart sounds: Normal heart sounds.  Pulmonary:     Effort: Pulmonary effort is normal. No respiratory distress.     Breath sounds: No wheezing.  Chest:  Breasts:    Right: No mass, nipple discharge, skin change or tenderness.     Left:  No mass, nipple discharge, skin change or tenderness.  Abdominal:     General: Bowel sounds are normal.     Palpations: Abdomen is soft.     Tenderness: There is no abdominal tenderness.  Musculoskeletal:     Cervical back: Normal range of motion. No erythema.     Right lower leg: Edema present.     Left lower leg: Edema present.  Lymphadenopathy:     Cervical: No cervical adenopathy.  Skin:    General: Skin is warm and dry.     Findings: No rash.  Neurological:     Mental Status: She is alert and oriented to person, place, and time.     Cranial Nerves: No cranial nerve deficit.     Sensory: No sensory deficit.     Deep Tendon Reflexes: Reflexes are normal and symmetric.  Psychiatric:        Attention and Perception: Attention normal.        Mood and Affect: Mood normal.     Wt Readings from Last 3 Encounters:  12/13/21 212 lb (96.2 kg)  07/18/21 196 lb (88.9 kg)  07/14/21 196 lb (88.9 kg)    BP 110/70   Pulse 60   Ht '5\' 5"'  (1.651 m)   Wt 212 lb (96.2 kg)   SpO2 99%   BMI 35.28 kg/m   Assessment and Plan: 1. Annual physical exam Exam is normal except for weight. Encourage regular exercise and appropriate dietary changes. - CBC with Differential/Platelet - Comprehensive metabolic panel - Hemoglobin A1c - Lipid panel - TSH  2. Recurrent major depressive disorder, in partial remission (HCC) Clinically stable on current regimen with good control of symptoms, No  SI or HI. Will continue current therapy. - sertraline (ZOLOFT) 100 MG tablet; Take 1 tablet (100 mg total) by mouth daily.  Dispense: 90 tablet; Refill: 3  3. Localized edema Will give HCTZ to take prn Elevate legs and limit sodium - hydrochlorothiazide (MICROZIDE) 12.5 MG capsule; Take 1 capsule (12.5 mg total) by mouth daily as needed.  Dispense: 30 capsule; Refill: 0  4. Herpes Valtrex 1000 mg bid for 1-2 days PRN - valACYclovir (VALTREX) 1000 MG tablet; Take 1 tablet (1,000 mg total) by mouth 2 (two)  times daily for 10 days.  Dispense: 20 tablet; Refill: 0  5. Benign breast cyst in female, right Repeat US recommended in September - US BREAST LTD UNI RIGHT INC AXILLA; Future   Partially dictated using Editor, commissioning. Any errors are unintentional.  Halina Maidens, MD [facility]  12/13/2021

## 2021-12-14 LAB — COMPREHENSIVE METABOLIC PANEL
ALT: 39 IU/L — ABNORMAL HIGH (ref 0–32)
AST: 28 IU/L (ref 0–40)
Albumin/Globulin Ratio: 1.6 (ref 1.2–2.2)
Albumin: 4.1 g/dL (ref 3.8–4.9)
Alkaline Phosphatase: 100 IU/L (ref 44–121)
BUN/Creatinine Ratio: 21 (ref 9–23)
BUN: 17 mg/dL (ref 6–24)
Bilirubin Total: 0.4 mg/dL (ref 0.0–1.2)
CO2: 26 mmol/L (ref 20–29)
Calcium: 8.9 mg/dL (ref 8.7–10.2)
Chloride: 101 mmol/L (ref 96–106)
Creatinine, Ser: 0.82 mg/dL (ref 0.57–1.00)
Globulin, Total: 2.6 g/dL (ref 1.5–4.5)
Glucose: 82 mg/dL (ref 70–99)
Potassium: 4.2 mmol/L (ref 3.5–5.2)
Sodium: 142 mmol/L (ref 134–144)
Total Protein: 6.7 g/dL (ref 6.0–8.5)
eGFR: 86 mL/min/{1.73_m2} (ref >59)

## 2021-12-14 LAB — CBC WITH DIFFERENTIAL/PLATELET
Basophils Absolute: 0 10*3/uL (ref 0.0–0.2)
Basos: 0 %
EOS (ABSOLUTE): 0.1 10*3/uL (ref 0.0–0.4)
Eos: 1 %
Hematocrit: 39.6 % (ref 34.0–46.6)
Hemoglobin: 13 g/dL (ref 11.1–15.9)
Immature Grans (Abs): 0 10*3/uL (ref 0.0–0.1)
Immature Granulocytes: 0 %
Lymphocytes Absolute: 1.6 10*3/uL (ref 0.7–3.1)
Lymphs: 29 %
MCH: 30.7 pg (ref 26.6–33.0)
MCHC: 32.8 g/dL (ref 31.5–35.7)
MCV: 93 fL (ref 79–97)
Monocytes Absolute: 0.5 10*3/uL (ref 0.1–0.9)
Monocytes: 8 %
Neutrophils Absolute: 3.4 10*3/uL (ref 1.4–7.0)
Neutrophils: 62 %
Platelets: 292 10*3/uL (ref 150–450)
RBC: 4.24 x10E6/uL (ref 3.77–5.28)
RDW: 11.9 % (ref 11.7–15.4)
WBC: 5.6 10*3/uL (ref 3.4–10.8)

## 2021-12-14 LAB — LIPID PANEL
Chol/HDL Ratio: 2.7 ratio (ref 0.0–4.4)
Cholesterol, Total: 170 mg/dL (ref 100–199)
HDL: 64 mg/dL (ref >39)
LDL Chol Calc (NIH): 93 mg/dL (ref 0–99)
Triglycerides: 65 mg/dL (ref 0–149)
VLDL Cholesterol Cal: 13 mg/dL (ref 5–40)

## 2021-12-14 LAB — TSH: TSH: 0.901 u[IU]/mL (ref 0.450–4.500)

## 2021-12-14 LAB — HEMOGLOBIN A1C
Est. average glucose Bld gHb Est-mCnc: 108 mg/dL
Hgb A1c MFr Bld: 5.4 % (ref 4.8–5.6)

## 2021-12-18 DIAGNOSIS — Z01818 Encounter for other preprocedural examination: Secondary | ICD-10-CM | POA: Diagnosis not present

## 2021-12-18 DIAGNOSIS — M25551 Pain in right hip: Secondary | ICD-10-CM | POA: Diagnosis not present

## 2021-12-19 ENCOUNTER — Ambulatory Visit
Admission: EM | Admit: 2021-12-19 | Discharge: 2021-12-19 | Disposition: A | Discharge status: Home / Self Care | Payer: BC Managed Care – PPO | Attending: Emergency Medicine | Admitting: Emergency Medicine

## 2021-12-19 DIAGNOSIS — K047 Periapical abscess without sinus: Secondary | ICD-10-CM

## 2021-12-19 MED ORDER — AMOXICILLIN-POT CLAVULANATE 875-125 MG PO TABS: 1.0000 | ORAL_TABLET | Freq: Two times a day (BID) | ORAL | 0 refills | Status: AC

## 2021-12-19 NOTE — ED Triage Notes (Signed)
PT c/o soreness along right lower jaw starting Saturday and then throbbing in the right ear.  Pt is now having right side jaw pain, ear pain, and sore throat x4days.

## 2021-12-19 NOTE — ED Provider Notes (Signed)
[facility]    CSN: [id number] Arrival date & time: 12/19/21  1725      History   Chief Complaint Chief Complaint  Patient presents with   Sore Throat   Jaw Pain         HPI Meghan Weeks is a 51 y.o. female.   HPI  51 year old female here for evaluation of dental complaint.  Patient reports that she started with soreness on the lower part of her jaw on the right 3 days ago.  This then radiated to throbbing in her right ear and also pain on the right side of her neck and her throat.  She states that she is having pain in her teeth but she denies fever, runny nose, nasal congestion, or cough.  Past Medical History:  Diagnosis Date   ADHD    Anxiety    Bronchitis    Car sickness    Cholelithiasis    Complication of anesthesia    Depression    History of basal cell cancer    forehead and nose   Kidney atrophy    right kidney shrinking?   Neuroma of foot    PONV (postoperative nausea and vomiting)    Psoriasis    Renal stones 03/2017   right    Sleep apnea 2007 or 2008   CPAP    Patient Active Problem List   Diagnosis Date Noted   Polyp of ascending colon    Family history of colonic polyps    Pain in joint of right hip 02/15/2021   S/P TAH-BSO 05/22/2020   Recurrent major depressive disorder, in partial remission ([place]) 01/21/2019   Bariatric surgery status 12/16/2017   Sleep apnea    Psoriasis    Neuroma of foot    History of basal cell cancer    Chronic pain of right knee 05/18/2016   Rectocele 06/25/2011    Past Surgical History:  Procedure Laterality Date   ABDOMINAL HYSTERECTOMY     [facility]   CHOLECYSTECTOM[facility]     COLONOSCOPY     COLONOSCOPY WITH PROPOFOL N/A 07/14/2021   Procedure: COLONOSCOPY WITH PROPOFOL;  Surgeon: Lin Landsman, MD;  Location: [place];  Service: Gastroenterology;  Laterality: N/A;   CYSTOSCOPY/URETEROSCOPY/HOLMIUM LASER/STENT PLACEMENT  Left 04/24/2019   Procedure: CYSTOSCOPY/LEFT URETEROSCOPY AND LEFT RETROGRADE PYELOGRAM/HOLMIUM LASER/STENT PLACEMENT;  Surgeon: Billey Co, MD;  Location: [facility];  Service: Urology;  Laterality: Left;   EXCISION MORTON'S NEUROMA Right    gastric sleeve surgery  2013   with repair of hiatal hernia   REVISION OF GASTRIC SLEEVE TO BILIOPANCREATIC DIVERSION WITH DUODENAL SWITCH LAPAROSCOPIC  03/13/2018   right elbow surgery     right wrist surgery     TOTAL ABDOMINAL HYSTERECTOMY  2006    OB History   No obstetric history on file.      Home Medications    Prior to Admission medications   Medication Sig Start Date End Date Taking? Authorizing Provider  albuterol (VENTOLIN HFA) 108 (90 Base) MCG/ACT inhaler TAKE 2 PUFFS BY MOUTH EVERY 6 HOURS AS NEEDED FOR WHEEZE OR SHORTNESS OF BREATH 08/19/20  Yes Glean Hess, MD  amoxicillin-clavulanate (AUGMENTIN) 875-125 MG tablet Take 1 tablet by mouth every 12 (twelve) hours for 10 days. 12/19/21 12/29/21 Yes Margarette Canada, NP  celecoxib (CELEBREX) 100 MG capsule Take 100 mg by mouth 2 (two) times daily.   Yes [provider]  hydrochlorothiazide (MICROZIDE) 12.5 MG capsule Take 1 capsule (12.5 mg total) by mouth daily as needed. 12/13/21  Yes Glean Hess, MD  metroNIDAZOLE (METROGEL) 0.75 % gel Apply topically 2 (two) times daily. 11/24/21  Yes [provider]  Multiple Vitamins-Minerals (BARIATRIC MULTIVITAMINS/IRON PO) Take 1 tablet by mouth 2 (two) times daily.   Yes [provider]  NON FORMULARY CPAP at 10 cm H2O   Yes [provider]  sertraline (ZOLOFT) 100 MG tablet Take 1 tablet (100 mg total) by mouth daily. 12/13/21  Yes Glean Hess, MD  valACYclovir (VALTREX) 1000 MG tablet Take 1 tablet (1,000 mg total) by mouth 2 (two) times daily for 10 days. 12/13/21 12/23/21 Yes Glean Hess, MD  promethazine (PHENERGAN) 12.5 MG tablet Take 1 tablet (12.5 mg total) by mouth every 6 (six) hours  as needed for nausea or vomiting. 04/21/19 05/16/19  Billey Co, MD    Family History Family History  Problem Relation Age of Onset   Hematuria Mother    Diabetes Father    Hypertension Father    Diabetes Sister    Hypertension Sister    Colon cancer Maternal Grandmother    Uterine cancer Maternal Grandmother    Kidney cancer Neg Hx    Bladder Cancer Neg Hx    Breast cancer Neg Hx     Social History Social History   Tobacco Use   Smoking status: Former    Packs/day: 0.50    Years: 10.00    Total pack years: 5.00    Types: Cigarettes    Quit date: 1995    Years since quitting: 28.5   Smokeless tobacco: Never  Vaping Use   Vaping Use: Never used  Substance Use Topics   Alcohol use: Not Currently    Comment: social   Drug use: No     Allergies   Codeine and Levonorgestrel-ethinyl estrad   Review of Systems Review of Systems  Constitutional:  Negative for fever.  HENT:  Positive for dental problem and sore throat. Negative for congestion and rhinorrhea.   Respiratory:  Negative for cough.      Physical Exam Triage Vital Signs ED Triage Vitals [12/19/21 1744]  Enc Vitals Group     BP      Pulse      Resp      Temp      Temp src      SpO2      Weight 196 lb (88.9 kg)     Height '5\' 5"'$  (1.651 m)     Head Circumference      Peak Flow      Pain Score 8     Pain Loc      Pain Edu?      Excl. in [place]?    No data found.  Updated Vital Signs BP 109/80 (BP Location: Left Arm)   Pulse (!) 57   Temp 98.2 F (36.8 C) (Oral)   Resp 18   Ht '5\' 5"'$  (1.651 m)   Wt 196 lb (88.9 kg)   SpO2 100%   BMI 32.62 kg/m   Visual Acuity Right Eye Distance:   Left Eye Distance:   Bilateral Distance:    Right Eye Near:   Left Eye Near:    Bilateral Near:     Physical Exam Vitals and nursing note reviewed.  Constitutional:      Appearance: Normal appearance. She is not ill-appearing.  HENT:     Head: Normocephalic  and atraumatic.     Right Ear:  Tympanic membrane, ear canal and external ear normal. There is no impacted cerumen.     Left Ear: Tympanic membrane, ear canal and external ear normal. There is no impacted cerumen.     Mouth/Throat:     Mouth: Mucous membranes are moist.     Pharynx: Oropharyngeal exudate and posterior oropharyngeal erythema present.  Musculoskeletal:     Cervical back: Normal range of motion and neck supple.  Lymphadenopathy:     Cervical: Cervical adenopathy present.  Skin:    General: Skin is warm and dry.     Capillary Refill: Capillary refill takes less than 2 seconds.  Neurological:     General: No focal deficit present.     Mental Status: She is alert and oriented to person, place, and time.  Psychiatric:        Mood and Affect: Mood normal.        Behavior: Behavior normal.        Thought Content: Thought content normal.        Judgment: Judgment normal.      UC Treatments / Results  Labs (all labs ordered are listed, but only abnormal results are displayed) Labs Reviewed - No data to display  EKG   Radiology No results found.  Procedures Procedures (including critical care time)  Medications Ordered in UC Medications - No data to display  Initial Impression / Assessment and Plan / UC Course  I have reviewed the triage vital signs and the nursing notes.  Pertinent labs & imaging results that were available during my care of the patient were reviewed by me and considered in my medical decision making (see chart for details).  Patient is a pleasant, nontoxic-appearing 51 year old female here for evaluation of pain in her right jaw that radiates to her right ear and down her right throat that started 3 days ago.  Patient denies any fever, runny nose, nasal congestion, or cough.  On exam patient has pearly-gray tympanic membranes bilaterally with normal light reflex and clear external auditory canals.  Nasal mucosa is unremarkable.  Oropharyngeal exam reveals erythema to the buccal  side of the right lower gum near the rear 2 molars.  The gum tissue is tender to palpation and there is white exudate coming from both of the premolars extending onto the gum tissue.  The lingual side is unremarkable.  Patient does have some mild swelling to the right side of her mandible but no submental or submandibular fullness.  She does have cervical lymphadenopathy present on the right.  Patient exam is consistent with dental infection.  Even though her teeth are not tender to percussion she does state that there is pain in her teeth.  I have instructed her to make a follow-up appointment with her dentist and I will discharge her home on Augmentin twice daily for 10 days with food.  She should also rinse with Listerine or salt water following meals and at bedtime to wash away pus and help with the infection.  If she has an increase in swelling, increasing pain, or develops fever she should go to the ER at [facility] to be evaluated by the dentist on-call.   Final Clinical Impressions(s) / UC Diagnoses   Final diagnoses:  Dental infection     Discharge Instructions      Take the Augmentin twice daily with food for 10 days for treatment of your dental infection.  Use over-the-counter Tylenol and  ibuprofen for swelling and mild to moderate pain.  Rinse with warm salt water, or Listerine, after each meal to remove food particles and wash away any pus that is collecting.  If you develop any increasing or swelling, fever, pain, or difficulty swallowing you to go to the emergency department at [facility] with a have an oral surgeon and also a dentist on-call.      ED Prescriptions     Medication Sig Dispense Auth. Provider   amoxicillin-clavulanate (AUGMENTIN) 875-125 MG tablet Take 1 tablet by mouth every 12 (twelve) hours for 10 days. 20 tablet Margarette Canada, NP      PDMP not reviewed this encounter.   Margarette Canada, NP 12/19/21 1806

## 2021-12-19 NOTE — Discharge Instructions (Signed)
Take the Augmentin twice daily with food for 10 days for treatment of your dental infection.  Use over-the-counter Tylenol and ibuprofen for swelling and mild to moderate pain.  Rinse with warm salt water, or Listerine, after each meal to remove food particles and wash away any pus that is collecting.  If you develop any increasing or swelling, fever, pain, or difficulty swallowing you to go to the emergency department at [facility] with a have an oral surgeon and also a dentist on-call.

## 2021-12-21 ENCOUNTER — Ambulatory Visit (INDEPENDENT_AMBULATORY_CARE_PROVIDER_SITE_OTHER): Payer: Self-pay | Admitting: Podiatry

## 2021-12-21 DIAGNOSIS — B351 Tinea unguium: Secondary | ICD-10-CM

## 2021-12-21 DIAGNOSIS — L603 Nail dystrophy: Secondary | ICD-10-CM

## 2021-12-21 NOTE — Progress Notes (Signed)
Patient presents today for the 3rd laser treatment. Diagnosed with mycotic nail infection by Dr. Milinda Pointer.    Toenail most affected right hallux.   All other systems are negative.   Nails were filed thin. Laser therapy was administered to 1st toenails bilateral and patient tolerated the treatment well. All safety precautions were in place.      Follow up in 6 weeks for laser # 4.

## 2021-12-26 DIAGNOSIS — M7061 Trochanteric bursitis, right hip: Secondary | ICD-10-CM | POA: Diagnosis not present

## 2021-12-26 DIAGNOSIS — M7631 Iliotibial band syndrome, right leg: Secondary | ICD-10-CM | POA: Diagnosis not present

## 2021-12-26 DIAGNOSIS — Z6832 Body mass index (BMI) 32.0-32.9, adult: Secondary | ICD-10-CM | POA: Diagnosis not present

## 2022-01-07 ENCOUNTER — Other Ambulatory Visit: Payer: Self-pay | Admitting: Internal Medicine

## 2022-01-07 DIAGNOSIS — R600 Localized edema: Secondary | ICD-10-CM

## 2022-01-08 NOTE — Telephone Encounter (Signed)
Requested Prescriptions  Pending Prescriptions Disp Refills  . hydrochlorothiazide (MICROZIDE) 12.5 MG capsule [Pharmacy Med Name: HYDROCHLOROTHIAZIDE 12.5 MG CP] 30 capsule 2    Sig: TAKE 1 CAPSULE (12.5 MG TOTAL) BY MOUTH DAILY AS NEEDED.     Cardiovascular: Diuretics - Thiazide Passed - 01/07/2022 11:31 AM      Passed - Cr in normal range and within 180 days    Creatinine  Date Value Ref Range Status  08/22/2011 1.04 0.60 - 1.30 mg/dL Final   Creatinine, Ser  Date Value Ref Range Status  12/13/2021 0.82 0.57 - 1.00 mg/dL Final         Passed - K in normal range and within 180 days    Potassium  Date Value Ref Range Status  12/13/2021 4.2 3.5 - 5.2 mmol/L Final  08/22/2011 4.2 3.5 - 5.1 mmol/L Final         Passed - Na in normal range and within 180 days    Sodium  Date Value Ref Range Status  12/13/2021 142 134 - 144 mmol/L Final  08/22/2011 141 136 - 145 mmol/L Final         Passed - Last BP in normal range    BP Readings from Last 1 Encounters:  12/19/21 109/80         Passed - Valid encounter within last 6 months    Recent Outpatient Visits          3 weeks ago Annual physical exam   [facility] Glean Hess, MD   6 months ago Encounter for screening mammogram for breast cancer   [facility] Glean [facility], [facility]   1 year ago Annual physical exam   [facility] Glean Hess, MD   2 years ago Annual physical exam   [facility] Glean Hess, MD   3 years ago Environmental and seasonal allergies   [facility] Glean Hess, MD      Future Appointments            In 31 months Army Melia Jesse Sans, MD [facility], [facility]

## 2022-01-18 DIAGNOSIS — S46111A Strain of muscle, fascia and tendon of long head of biceps, right arm, initial encounter: Secondary | ICD-10-CM | POA: Diagnosis not present

## 2022-02-15 ENCOUNTER — Ambulatory Visit
Admission: RE | Admit: 2022-02-15 | Discharge: 2022-02-15 | Disposition: A | Discharge status: Home / Self Care | Payer: BC Managed Care – PPO | Source: Ambulatory Visit | Attending: Internal Medicine | Admitting: Internal Medicine

## 2022-02-15 DIAGNOSIS — N6001 Solitary cyst of right breast: Secondary | ICD-10-CM | POA: Diagnosis not present

## 2022-02-15 DIAGNOSIS — N6489 Other specified disorders of breast: Secondary | ICD-10-CM | POA: Diagnosis not present

## 2022-02-16 ENCOUNTER — Encounter: Payer: Self-pay | Admitting: Podiatry

## 2022-02-16 ENCOUNTER — Ambulatory Visit (INDEPENDENT_AMBULATORY_CARE_PROVIDER_SITE_OTHER): Payer: BC Managed Care – PPO

## 2022-02-16 DIAGNOSIS — B351 Tinea unguium: Secondary | ICD-10-CM

## 2022-02-16 DIAGNOSIS — L603 Nail dystrophy: Secondary | ICD-10-CM

## 2022-02-16 DIAGNOSIS — S93692A Other sprain of left foot, initial encounter: Secondary | ICD-10-CM

## 2022-02-16 NOTE — Progress Notes (Signed)
Patient presents today for the 4th laser treatment. Diagnosed with mycotic nail infection by Dr. Milinda Pointer.    Toenail most affected right hallux.   All other systems are negative.   Nails were filed thin. Laser therapy was administered to 1st toenails bilateral and patient tolerated the treatment well. All safety precautions were in place.      Follow up in 6 weeks for laser # 5.

## 2022-03-02 ENCOUNTER — Ambulatory Visit
Admission: RE | Admit: 2022-03-02 | Discharge: 2022-03-02 | Disposition: A | Discharge status: Home / Self Care | Payer: BC Managed Care – PPO | Source: Ambulatory Visit | Attending: Podiatry | Admitting: Podiatry

## 2022-03-02 DIAGNOSIS — L718 Other rosacea: Secondary | ICD-10-CM | POA: Diagnosis not present

## 2022-03-02 DIAGNOSIS — S93692A Other sprain of left foot, initial encounter: Secondary | ICD-10-CM

## 2022-03-02 DIAGNOSIS — R600 Localized edema: Secondary | ICD-10-CM | POA: Diagnosis not present

## 2022-03-02 DIAGNOSIS — M25572 Pain in left ankle and joints of left foot: Secondary | ICD-10-CM | POA: Diagnosis not present

## 2022-03-02 DIAGNOSIS — L400 Psoriasis vulgaris: Secondary | ICD-10-CM | POA: Diagnosis not present

## 2022-03-02 DIAGNOSIS — L821 Other seborrheic keratosis: Secondary | ICD-10-CM | POA: Diagnosis not present

## 2022-03-07 ENCOUNTER — Telehealth: Payer: Self-pay | Admitting: *Deleted

## 2022-03-07 ENCOUNTER — Ambulatory Visit: Payer: BC Managed Care – PPO | Admitting: Podiatry

## 2022-03-07 NOTE — Telephone Encounter (Signed)
I called the patient to cancel her appointment for today, per Dr. Milinda Pointer.  I informed her that he is sending her MRI to [facility] for further review.  She stated her foot is hurting her really bad and asked if there's anything else she can do in the meantime.  She stated she's taking Celebrex for her hip but it's not helping either problem.  Per Dr. Milinda Pointer, I informed her to wear her air fracture walker.  He said to stop taking the Celebrex and start taking three Ibuprofen and 1 extra strength Tylenol all together for three times a day for pain.  I informed her we will call her once we get the final results and schedule her an appointment.  Patient stated understanding.

## 2022-03-08 ENCOUNTER — Telehealth: Payer: Self-pay | Admitting: *Deleted

## 2022-03-08 NOTE — Telephone Encounter (Signed)
Faxed request to [facility] overread, requested disc to be sent from [place] to [facility] received 03/08/22.

## 2022-03-08 NOTE — Telephone Encounter (Signed)
-----   Message from [place] sent at 03/07/2022  8:26 AM EDT ----- Regarding: Overread requested Dr. Milinda Pointer wants this patient's MRI sent out for an overread. Thank you!!

## 2022-03-29 DIAGNOSIS — M25551 Pain in right hip: Secondary | ICD-10-CM | POA: Diagnosis not present

## 2022-03-30 NOTE — Telephone Encounter (Signed)
Received overread report, needs appt

## 2022-04-04 ENCOUNTER — Encounter: Payer: Self-pay | Admitting: Podiatry

## 2022-04-06 ENCOUNTER — Ambulatory Visit (INDEPENDENT_AMBULATORY_CARE_PROVIDER_SITE_OTHER): Payer: BC Managed Care – PPO | Admitting: *Deleted

## 2022-04-06 DIAGNOSIS — L603 Nail dystrophy: Secondary | ICD-10-CM

## 2022-04-06 NOTE — Progress Notes (Signed)
Patient presents today for the 5th laser treatment. Diagnosed with mycotic nail infection by Dr. Milinda Pointer.    Toenail most affected hallux bilateral. She has been using her psoriasis cream on the nails and she thinks that has helped them a lot.   All other systems are negative.   Nails were filed thin. Laser therapy was administered to 1st toenails bilateral and patient tolerated the treatment well. All safety precautions were in place.      Follow up in 8 weeks for laser # 6.

## 2022-04-14 ENCOUNTER — Other Ambulatory Visit: Payer: Self-pay | Admitting: Internal Medicine

## 2022-04-14 DIAGNOSIS — R600 Localized edema: Secondary | ICD-10-CM

## 2022-04-16 ENCOUNTER — Ambulatory Visit (INDEPENDENT_AMBULATORY_CARE_PROVIDER_SITE_OTHER): Payer: BC Managed Care – PPO | Admitting: Podiatry

## 2022-04-16 ENCOUNTER — Encounter: Payer: Self-pay | Admitting: Podiatry

## 2022-04-16 DIAGNOSIS — S86312D Strain of muscle(s) and tendon(s) of peroneal muscle group at lower leg level, left leg, subsequent encounter: Secondary | ICD-10-CM | POA: Diagnosis not present

## 2022-04-16 DIAGNOSIS — M722 Plantar fascial fibromatosis: Secondary | ICD-10-CM

## 2022-04-16 NOTE — Progress Notes (Signed)
She presents today to discuss her MRI findings.  She states that her foot is hurting so bad she cannot even go to the store anymore.  She states that is affecting her ability perform her daily activities of life and her ability to maintain her general good health.  Objective: Vital signs are stable she is alert and oriented x3.  Pulses are palpable.  There is no erythema cellulitis drainage odor that she does have edema and pain on palpation of the peroneal tendons as well as the plantar aspect of the left foot.  MRI confirms peroneal tendinitis is and Planter fasciitis of the left foot.  Assessment: Planter fasciitis peroneal tendinosis.  Plan: Discussed etiology pathology and surgical therapies peroneal tendon repair endoscopic plantar fasciotomy PRP injection and cast application will be necessary we discussed this in great detail today she understands from amenable to it does understand that she will need to be nonweightbearing for period of time.  She also understands the possible side effects and complications associated with the surgery which may include but not limited to postop pain bleeding swell infection recurrence need for further surgery.

## 2022-04-16 NOTE — Telephone Encounter (Signed)
Requested Prescriptions  Pending Prescriptions Disp Refills   hydrochlorothiazide (MICROZIDE) 12.5 MG capsule [Pharmacy Med Name: HYDROCHLOROTHIAZIDE 12.5 MG CP] 90 capsule 0    Sig: TAKE 1 CAPSULE (12.5 MG TOTAL) BY MOUTH DAILY AS NEEDED.     Cardiovascular: Diuretics - Thiazide Passed - 04/14/2022  2:27 AM      Passed - Cr in normal range and within 180 days    Creatinine  Date Value Ref Range Status  08/22/2011 1.04 0.60 - 1.30 mg/dL Final   Creatinine, Ser  Date Value Ref Range Status  12/13/2021 0.82 0.57 - 1.00 mg/dL Final         Passed - K in normal range and within 180 days    Potassium  Date Value Ref Range Status  12/13/2021 4.2 3.5 - 5.2 mmol/L Final  08/22/2011 4.2 3.5 - 5.1 mmol/L Final         Passed - Na in normal range and within 180 days    Sodium  Date Value Ref Range Status  12/13/2021 142 134 - 144 mmol/L Final  08/22/2011 141 136 - 145 mmol/L Final         Passed - Last BP in normal range    BP Readings from Last 1 Encounters:  12/19/21 109/80         Passed - Valid encounter within last 6 months    Recent Outpatient Visits           4 months ago Annual physical exam   [place] [facility], Jesse Sans, MD   10 months ago Encounter for screening mammogram for breast cancer   [place] [facility] at [facility], Jesse Sans, MD   1 year ago Annual physical exam   [facility] and [facility] at [facility], Jesse Sans, MD   3 years ago Annual physical exam   [facility] and [facility] Medicine at [facility], Jesse Sans, MD   3 years ago Environmental and seasonal allergies   [place] [facility] at [facility], Jesse Sans, MD       Future Appointments             In 8 months Army Melia, Jesse Sans, MD [place] and Sports Medicine at [facility], [facility]

## 2022-04-18 ENCOUNTER — Telehealth: Payer: Self-pay | Admitting: Podiatry

## 2022-04-18 NOTE — Telephone Encounter (Signed)
DOS: 05/04/2022  [employer]  Endoscopic Plantar Fasciotomy Lt ([id number]) Repair Peroneal Tendon Lt ([id number]) PRP Injection Lt ([id number]) Cast Application Lt   DX: O88.7 and S86.312D  Deductible: $1,000 with $0 remaining Out-of-Pocket: $4,000 with $0 remaining CoInsurance: 0%  Prior authorization is not required per [facility]  Call Reference #: [id number]

## 2022-04-25 DIAGNOSIS — M25551 Pain in right hip: Secondary | ICD-10-CM | POA: Diagnosis not present

## 2022-05-02 ENCOUNTER — Other Ambulatory Visit: Payer: Self-pay | Admitting: Podiatry

## 2022-05-02 DIAGNOSIS — Z09 Encounter for follow-up examination after completed treatment for conditions other than malignant neoplasm: Secondary | ICD-10-CM | POA: Diagnosis not present

## 2022-05-02 MED ORDER — ONDANSETRON HCL 4 MG PO TABS: 4.0000 mg | ORAL_TABLET | Freq: Three times a day (TID) | ORAL | 0 refills | Status: DC | PRN

## 2022-05-02 MED ORDER — HYDROCODONE-ACETAMINOPHEN 10-325 MG PO TABS: 1.0000 | ORAL_TABLET | Freq: Four times a day (QID) | ORAL | 0 refills | Status: DC | PRN

## 2022-05-02 MED ORDER — CEPHALEXIN 500 MG PO CAPS: 500.0000 mg | ORAL_CAPSULE | Freq: Three times a day (TID) | ORAL | 0 refills | Status: DC

## 2022-05-04 ENCOUNTER — Other Ambulatory Visit: Payer: Self-pay | Admitting: Internal Medicine

## 2022-05-04 DIAGNOSIS — S86311A Strain of muscle(s) and tendon(s) of peroneal muscle group at lower leg level, right leg, initial encounter: Secondary | ICD-10-CM | POA: Diagnosis not present

## 2022-05-04 DIAGNOSIS — B009 Herpesviral infection, unspecified: Secondary | ICD-10-CM

## 2022-05-04 DIAGNOSIS — M722 Plantar fascial fibromatosis: Secondary | ICD-10-CM | POA: Diagnosis not present

## 2022-05-04 DIAGNOSIS — G8918 Other acute postprocedural pain: Secondary | ICD-10-CM | POA: Diagnosis not present

## 2022-05-04 DIAGNOSIS — X58XXXA Exposure to other specified factors, initial encounter: Secondary | ICD-10-CM | POA: Diagnosis not present

## 2022-05-04 DIAGNOSIS — S86312D Strain of muscle(s) and tendon(s) of peroneal muscle group at lower leg level, left leg, subsequent encounter: Secondary | ICD-10-CM | POA: Diagnosis not present

## 2022-05-04 DIAGNOSIS — Y929 Unspecified place or not applicable: Secondary | ICD-10-CM | POA: Diagnosis not present

## 2022-05-04 HISTORY — PX: ANKLE SURGERY: SHX546

## 2022-05-04 NOTE — Telephone Encounter (Signed)
Pt called requesting to have this filled today, please advise

## 2022-05-04 NOTE — Telephone Encounter (Signed)
Requested medication (s) are due for refill today: routing for review  Requested medication (s) are on the active medication list: no  Last refill:  12/13/21  Future visit scheduled: yes  Notes to clinic:  Unable to refill per protocol, Rx expired. Medication is not on current list.     Requested Prescriptions  Pending Prescriptions Disp Refills   valACYclovir (VALTREX) 1000 MG tablet [Pharmacy Med Name: VALACYCLOVIR HCL 1 GRAM TABLET] 20 tablet 0    Sig: TAKE 1 TABLET (1000 MG) BY MOUTH TWICE A DAY FOR 10 DAYS     Antimicrobials:  Antiviral Agents - Anti-Herpetic Passed - 05/04/2022  2:36 PM      Passed - Valid encounter within last 12 months    Recent Outpatient Visits           4 months ago Annual physical exam   [place] [facility] at [facility], Jesse Sans, MD   10 months ago Encounter for screening mammogram for breast cancer   [place] [facility] at [facility], Jesse Sans, MD   1 year ago Annual physical exam   [place] [facility] at [facility], Jesse Sans, MD   3 years ago Annual physical exam   [facility] and [facility] Medicine at [facility], Jesse Sans, MD   3 years ago Environmental and seasonal allergies    Primary Care and Sports Medicine at [facility], Jesse Sans, MD       Future Appointments             In 7 months Army Melia, Jesse Sans, MD [facility] Primary Care and Sports Medicine at [facility], [facility]

## 2022-05-04 NOTE — Telephone Encounter (Signed)
Requested medication (s) are due for refill today: routing for review  Requested medication (s) are on the active medication list: yes  Last refill: 12/13/21   Future visit scheduled: yes  Notes to clinic:  Unable to refill per protocol, Rx expired. Medication is not on current list, routing for review      Requested Prescriptions  Pending Prescriptions Disp Refills   valACYclovir (VALTREX) 1000 MG tablet [Pharmacy Med Name: VALACYCLOVIR HCL 1 GRAM TABLET] 20 tablet 0    Sig: TAKE 1 TABLET (1000 MG) BY MOUTH TWICE A DAY FOR 10 DAYS     Antimicrobials:  Antiviral Agents - Anti-Herpetic Passed - 05/04/2022  2:36 PM      Passed - Valid encounter within last 12 months    Recent Outpatient Visits           4 months ago Annual physical exam   [place] [facility] at [facility] Jesse Sans, MD   10 months ago Encounter for screening mammogram for breast cancer   [place] [facility] at [facility], Jesse Sans, MD   1 year ago Annual physical exam   [place] [facility] at [facility], Jesse Sans, MD   3 years ago Annual physical exam   [facility] and [facility] Medicine at [facility], Jesse Sans, MD   3 years ago Environmental and seasonal allergies   [place] [facility] and [facility] Medicine at [facility], Jesse Sans, MD       Future Appointments             In 7 months Army Melia, Jesse Sans, MD [facility] Primary Care and Sports Medicine at [facility], [facility]

## 2022-05-09 ENCOUNTER — Ambulatory Visit (INDEPENDENT_AMBULATORY_CARE_PROVIDER_SITE_OTHER): Payer: BC Managed Care – PPO | Admitting: Podiatry

## 2022-05-09 ENCOUNTER — Encounter: Payer: Self-pay | Admitting: Podiatry

## 2022-05-09 VITALS — BP 119/74 | HR 81

## 2022-05-09 DIAGNOSIS — Z9889 Other specified postprocedural states: Secondary | ICD-10-CM

## 2022-05-09 DIAGNOSIS — M722 Plantar fascial fibromatosis: Secondary | ICD-10-CM

## 2022-05-09 DIAGNOSIS — M25371 Other instability, right ankle: Secondary | ICD-10-CM

## 2022-05-09 DIAGNOSIS — S86312D Strain of muscle(s) and tendon(s) of peroneal muscle group at lower leg level, left leg, subsequent encounter: Secondary | ICD-10-CM

## 2022-05-09 NOTE — Progress Notes (Signed)
She presents today for follow-up of her endoscopic plantar fasciotomy and peroneal tendon repair with PRP injection.  Date of surgery was 05/04/2022 she denies fever chills nausea 5 muscle aches pains calf pain back pain chest pain shortness of breath.  She states that when she bends over she can feel a pull in the bottom of her foot.  States that she has not been taking pain medication because [facility] told her that it was on backorder.  She states that she feels like her right ankle is getting weak.  Objective: Vital signs are stable alert and oriented x 3.  Cast is intact she has good sensation to the toes good motion of the toes.  The cast is loose at the top.  Still snug around the ankle and foot.  Assessment: Well-healing surgical foot.  Plan: Continue the use of her knee scooter and I will follow-up with her in 1 week for cast change.  I also provided her with a Tri-Lock brace for the right foot and ankle for stability.

## 2022-05-16 ENCOUNTER — Ambulatory Visit (INDEPENDENT_AMBULATORY_CARE_PROVIDER_SITE_OTHER): Payer: BC Managed Care – PPO | Admitting: Podiatry

## 2022-05-16 ENCOUNTER — Encounter: Payer: Self-pay | Admitting: Podiatry

## 2022-05-16 DIAGNOSIS — S86312D Strain of muscle(s) and tendon(s) of peroneal muscle group at lower leg level, left leg, subsequent encounter: Secondary | ICD-10-CM

## 2022-05-16 DIAGNOSIS — Z9889 Other specified postprocedural states: Secondary | ICD-10-CM

## 2022-05-16 DIAGNOSIS — M722 Plantar fascial fibromatosis: Secondary | ICD-10-CM

## 2022-05-16 NOTE — Progress Notes (Signed)
She presents today date of surgery 05/04/2022 left foot endoscopic fasciotomy peroneal tendon repair PRP injection and cast application.  She denies fever chills nausea vomit muscle aches pains calf pain back pain chest pain shortness of breath states that she is already been back to work.  Objective: Vital signs are stable alert oriented x 3 there is cast intact dry and clean I once removed demonstrates dry sterile dressing intact all layers once removed demonstrates no bleeding no erythema some mild edema no cellulitis drainage or odor sutures are intact margins are well coapted I removed the sutures to the endoscopic fasciotomy sites today.  No bleeding no dehiscence.  Assessment: Well-healing surgical foot and ankle.  Plan: Redressed the foot today dressed a compressive dressing and placed her back in a cast.  She we will continue nonweightbearing.

## 2022-05-17 ENCOUNTER — Telehealth: Payer: Self-pay | Admitting: *Deleted

## 2022-05-17 NOTE — Telephone Encounter (Signed)
Patient is requesting a handicap placard ,once approved, will pick up from the [place] location.

## 2022-05-30 ENCOUNTER — Ambulatory Visit (INDEPENDENT_AMBULATORY_CARE_PROVIDER_SITE_OTHER): Payer: BC Managed Care – PPO | Admitting: Podiatry

## 2022-05-30 ENCOUNTER — Encounter: Payer: Self-pay | Admitting: Podiatry

## 2022-05-30 DIAGNOSIS — Z9889 Other specified postprocedural states: Secondary | ICD-10-CM

## 2022-05-30 DIAGNOSIS — S86312D Strain of muscle(s) and tendon(s) of peroneal muscle group at lower leg level, left leg, subsequent encounter: Secondary | ICD-10-CM | POA: Diagnosis not present

## 2022-05-30 DIAGNOSIS — M722 Plantar fascial fibromatosis: Secondary | ICD-10-CM

## 2022-05-30 NOTE — Progress Notes (Signed)
She presents today date of surgery 05/04/2022 left foot EPF peroneal tendon repair PRP injection and cast application.  She states that the cast got really loose as last time could feel my foot moving around and then feels like the toes are still swollen looks like the foot still swollen.  She is referring to her foot after the cast was removed.  Objective: Vital signs are stable alert and oriented x 3.  Cast is intact once removed demonstrates moderate edema staples are intact margins well coapted remove the staples today.  Margins remain well coapted skin good range of motion of her toes and her ankle full circumduction and dorsiflexion plantarflexion inversion eversion.  Assessment: Well-healing surgical foot leg.  Plan: At this point I recommended the use of the cam boot and allow her to start getting this wet she is really not to put any pressure on it she continue the use of the knee scooter I did encourage range of motion exercises and rehydration of her skin.  Follow-up with her in about 2 weeks

## 2022-05-31 ENCOUNTER — Encounter: Payer: Self-pay | Admitting: Podiatry

## 2022-06-01 ENCOUNTER — Other Ambulatory Visit: Payer: BC Managed Care – PPO

## 2022-06-13 ENCOUNTER — Encounter: Payer: Self-pay | Admitting: Podiatry

## 2022-06-13 ENCOUNTER — Ambulatory Visit (INDEPENDENT_AMBULATORY_CARE_PROVIDER_SITE_OTHER): Payer: BC Managed Care – PPO | Admitting: Podiatry

## 2022-06-13 VITALS — BP 120/66 | HR 68

## 2022-06-13 DIAGNOSIS — M722 Plantar fascial fibromatosis: Secondary | ICD-10-CM

## 2022-06-13 DIAGNOSIS — S86312D Strain of muscle(s) and tendon(s) of peroneal muscle group at lower leg level, left leg, subsequent encounter: Secondary | ICD-10-CM

## 2022-06-13 DIAGNOSIS — Z9889 Other specified postprocedural states: Secondary | ICD-10-CM

## 2022-06-13 NOTE — Progress Notes (Signed)
She presents today for postop visit #4 date of surgery 05/04/2022 left foot endoscopic plantar fasciotomy peroneal tendon repair PRP injection and cast application.  She states that is doing good for the most part to get some twinges every once in a while.  She states that she has been utilizing her knee scooter all the time.  Objective: Vital signs are stable she alert oriented x 3 CAM Walker is intact dry sterile dressing was intact once removed demonstrates no erythema to some mild edema no cellulitis drainage or odor incision site is mildly tender left.  But she has good abduction against resistance abduction eversion.  Assessment: Well-healing surgical foot.  Plan: Allow her start partial weightbearing and I like to follow-up with her in about 2 weeks to get full weightbearing at that time.

## 2022-06-28 ENCOUNTER — Ambulatory Visit (INDEPENDENT_AMBULATORY_CARE_PROVIDER_SITE_OTHER): Payer: BC Managed Care – PPO | Admitting: Podiatry

## 2022-06-28 DIAGNOSIS — S86312D Strain of muscle(s) and tendon(s) of peroneal muscle group at lower leg level, left leg, subsequent encounter: Secondary | ICD-10-CM

## 2022-06-28 DIAGNOSIS — Z9889 Other specified postprocedural states: Secondary | ICD-10-CM

## 2022-06-28 DIAGNOSIS — M722 Plantar fascial fibromatosis: Secondary | ICD-10-CM

## 2022-06-30 NOTE — Progress Notes (Signed)
She presents today date of surgery 05/04/2022 left foot EPF peroneal tendon repair and PRP injection with cast application.  She states that is feeling okay I got this even which really helps with my back and my hip.  Objective: Vital signs are stable she is alert oriented x 3 she still has pain to palpation to the surgical sites.  Limited range of motion presents with her cam walker and crutches.  Assessment: Slight restriction for this point in time I think is better probably to send her to physical therapy.    Plan: We will refer her to physical therapy in [place] and I will follow-up with her once they are complete.  I recommended that she start ambulating utilizing the boot only.

## 2022-07-05 ENCOUNTER — Encounter: Payer: Self-pay | Admitting: Podiatry

## 2022-07-09 ENCOUNTER — Other Ambulatory Visit: Payer: Self-pay | Admitting: Internal Medicine

## 2022-07-09 DIAGNOSIS — N63 Unspecified lump in unspecified breast: Secondary | ICD-10-CM

## 2022-07-09 DIAGNOSIS — R928 Other abnormal and inconclusive findings on diagnostic imaging of breast: Secondary | ICD-10-CM

## 2022-07-10 ENCOUNTER — Encounter: Payer: Self-pay | Admitting: Podiatry

## 2022-07-10 ENCOUNTER — Ambulatory Visit (INDEPENDENT_AMBULATORY_CARE_PROVIDER_SITE_OTHER): Payer: BC Managed Care – PPO | Admitting: Podiatry

## 2022-07-10 DIAGNOSIS — S86312D Strain of muscle(s) and tendon(s) of peroneal muscle group at lower leg level, left leg, subsequent encounter: Secondary | ICD-10-CM

## 2022-07-10 DIAGNOSIS — Z9889 Other specified postprocedural states: Secondary | ICD-10-CM

## 2022-07-10 DIAGNOSIS — M722 Plantar fascial fibromatosis: Secondary | ICD-10-CM

## 2022-07-10 MED ORDER — METHYLPREDNISOLONE 4 MG PO TBPK: ORAL_TABLET | ORAL | 0 refills | Status: DC

## 2022-07-10 NOTE — Progress Notes (Signed)
Patient came in today for a  large tri-lock brace, patient was instructed on how to use the brace.

## 2022-07-10 NOTE — Progress Notes (Signed)
She presents today date of surgery is 05/04/2022 for a endoscopic fasciotomy and peroneal tendon repair with PRP injection states that she was doing quite well until she had to do a lot of walking using a walker at work.  Continues to wear her boot and was able to walk without any assistance but has now since gone back to crutches and or a cane.  States that she is having pain in the heel as well as the knee with some swelling in the knee.  Objective: Vital signs are stable she is alert and oriented x 3 pulses are palpable she has some swelling in her left knee and left lower extremity no calf pain no warmth on palpation.  The only place that she has any tenderness is along the medial aspect of the ankle surgical sites are doing just fine no problems there I feel no fluctuance on 9 palpation of the posterior tibial tendon and she has good inversion against resistance.  Assessment residual inflammation to the left leg.  Residual inflammation of the heel and the ankle joint structures.  Plan: Start her on methylprednisolone put her on a Tri-Lock brace to the left foot try to get her away from the crutches.  She states that: Physical therapy said that they were not will be able to get to her until mid March.  So we will go and try to get her in with steroids physical therapy while thinking about mobic in office today normally other) that playing website rested discussed several

## 2022-07-16 ENCOUNTER — Telehealth: Payer: Self-pay | Admitting: *Deleted

## 2022-07-16 NOTE — Telephone Encounter (Signed)
Heather w/ [facility] is requesting a referral for patient's left foot pain, had surgery on 12/23,please fax to : [phone]

## 2022-07-20 NOTE — Telephone Encounter (Signed)
Referral has been faxed to [facility],confirmation received 07/20/22.

## 2022-07-25 DIAGNOSIS — M25562 Pain in left knee: Secondary | ICD-10-CM | POA: Diagnosis not present

## 2022-07-27 DIAGNOSIS — R2689 Other abnormalities of gait and mobility: Secondary | ICD-10-CM | POA: Diagnosis not present

## 2022-07-27 DIAGNOSIS — M25572 Pain in left ankle and joints of left foot: Secondary | ICD-10-CM | POA: Diagnosis not present

## 2022-08-01 DIAGNOSIS — R2689 Other abnormalities of gait and mobility: Secondary | ICD-10-CM | POA: Diagnosis not present

## 2022-08-01 DIAGNOSIS — M25572 Pain in left ankle and joints of left foot: Secondary | ICD-10-CM | POA: Diagnosis not present

## 2022-08-15 DIAGNOSIS — M25572 Pain in left ankle and joints of left foot: Secondary | ICD-10-CM | POA: Diagnosis not present

## 2022-08-15 DIAGNOSIS — R2689 Other abnormalities of gait and mobility: Secondary | ICD-10-CM | POA: Diagnosis not present

## 2022-08-17 ENCOUNTER — Ambulatory Visit
Admission: RE | Admit: 2022-08-17 | Discharge: 2022-08-17 | Disposition: A | Discharge status: Home / Self Care | Payer: BC Managed Care – PPO | Source: Ambulatory Visit | Attending: Internal Medicine | Admitting: Internal Medicine

## 2022-08-17 DIAGNOSIS — N63 Unspecified lump in unspecified breast: Secondary | ICD-10-CM

## 2022-08-17 DIAGNOSIS — N6001 Solitary cyst of right breast: Secondary | ICD-10-CM | POA: Diagnosis not present

## 2022-08-17 DIAGNOSIS — R928 Other abnormal and inconclusive findings on diagnostic imaging of breast: Secondary | ICD-10-CM | POA: Insufficient documentation

## 2022-08-22 DIAGNOSIS — M25572 Pain in left ankle and joints of left foot: Secondary | ICD-10-CM | POA: Diagnosis not present

## 2022-08-22 DIAGNOSIS — R2689 Other abnormalities of gait and mobility: Secondary | ICD-10-CM | POA: Diagnosis not present

## 2022-08-23 ENCOUNTER — Encounter: Payer: Self-pay | Admitting: Physician Assistant

## 2022-08-23 ENCOUNTER — Telehealth: Payer: BC Managed Care – PPO | Admitting: Family Medicine

## 2022-08-23 ENCOUNTER — Ambulatory Visit: Payer: BC Managed Care – PPO | Admitting: Physician Assistant

## 2022-08-23 VITALS — BP 112/70 | HR 68 | Temp 97.7°F | Ht 65.0 in | Wt 216.0 lb

## 2022-08-23 DIAGNOSIS — R1011 Right upper quadrant pain: Secondary | ICD-10-CM

## 2022-08-23 NOTE — Patient Instructions (Signed)
-  It was nice to meet you today -Your exam was reassuring -It is possible you have a small hernia that comes and goes - let's monitor this over time. Please let us know if symptoms get worse.

## 2022-08-23 NOTE — Progress Notes (Signed)
Date:  08/23/2022   Name:  Meghan Weeks   DOB:  Feb 16, 1971   MRN:  [id number]   Chief Complaint: Abdominal Pain   HPI Debrina is a pleasant 52 year old female new to me today, typically sees my colleague Dr. Halina Maidens MD, here today to discuss intermittent right upper quadrant abdominal pain for the last 3 months.  She tells me in Dec 2023 she leaned forward to get up from the toilet and felt a "flip, like something shifted deep inside" in the RUQ which was painless at the time but has since bothered her with intermittent dull aching 2/10 intensity and active at the time of exam.   About 1-2wk ago she had a similar occurrence right after a BM but this time she said something "popped out, like a ball" and was a bit painful but she was able to massage it back into place which resolved the pain.  Patient reports no change to her baseline BM frequency or quality.  Denies blood in the stool.  Colonoscopy February 2023 with recommended follow-up in 2028.  Surgical history significant for total abdominal hysterectomy, cholecystectomy, and gastric sleeve 2013 with revision 2019 for laparoscopic duodenal switch.   Recent Labs     Component Value Date/Time   NA 142 12/13/2021 0951   NA 141 08/22/2011 0425   K 4.2 12/13/2021 0951   K 4.2 08/22/2011 0425   CL 101 12/13/2021 0951   CL 105 08/22/2011 0425   CO2 26 12/13/2021 0951   CO2 27 08/22/2011 0425   GLUCOSE 82 12/13/2021 0951   GLUCOSE 125 (H) 04/18/2019 1346   GLUCOSE 92 08/22/2011 0425   BUN 17 12/13/2021 0951   BUN 9 08/22/2011 0425   CREATININE 0.82 12/13/2021 0951   CREATININE 1.04 08/22/2011 0425   CALCIUM 8.9 12/13/2021 0951   CALCIUM 8.3 (L) 08/22/2011 0425   PROT 6.7 12/13/2021 0951   ALBUMIN 4.1 12/13/2021 0951   AST 28 12/13/2021 0951   ALT 39 (H) 12/13/2021 0951   ALKPHOS 100 12/13/2021 0951   BILITOT 0.4 12/13/2021 0951   GFRNONAA 81 05/23/2020 1011   GFRNONAA >60 08/22/2011 0425   GFRAA 94 05/23/2020  1011   GFRAA >60 08/22/2011 0425    Lab Results  Component Value Date   WBC 5.6 12/13/2021   HGB 13.0 12/13/2021   HCT 39.6 12/13/2021   MCV 93 12/13/2021   PLT 292 12/13/2021   Lab Results  Component Value Date   HGBA1C 5.4 12/13/2021   Lab Results  Component Value Date   CHOL 170 12/13/2021   HDL 64 12/13/2021   LDLCALC 93 12/13/2021   TRIG 65 12/13/2021   CHOLHDL 2.7 12/13/2021   Lab Results  Component Value Date   TSH 0.901 12/13/2021    Review of Systems  Gastrointestinal:  Positive for abdominal pain.  All other systems reviewed and are negative.   Patient Active Problem List   Diagnosis Date Noted   Polyp of ascending colon    Family history of colonic polyps    Pain in joint of right hip 02/15/2021   S/P TAH-BSO 05/22/2020   Recurrent major depressive disorder, in partial remission ([place]) 01/21/2019   Bariatric surgery status 12/16/2017   Sleep apnea    Psoriasis    Neuroma of foot    History of basal cell cancer    Chronic pain of right knee 05/18/2016   Rectocele 06/25/2011    Allergies  Allergen Reactions   Codeine  Other (See Comments)    In Cough Syrup- Caused Migraines   Levonorgestrel-Ethinyl Estrad Other (See Comments)    Molli Posey) birth control patch "made me sick"    Past Surgical History:  Procedure Laterality Date   ABDOMINAL HYSTERECTOMY     ANKLE SURGERY Left 05/04/2022   BASAL CELL CARCINOMA EXCISION  03/06/2017   On [facility]   CHOLECYSTECTOMY     COLONOSCOPY     COLONOSCOPY WITH PROPOFOL N/A 07/14/2021   Procedure: COLONOSCOPY WITH PROPOFOL;  Surgeon: Lin Landsman, MD;  Location: [facility] ENDOSCOPY;  Service: Gastroenterology;  Laterality: N/A;   CYSTOSCOPY/URETEROSCOPY/HOLMIUM LASER/STENT PLACEMENT Left 04/24/2019   Procedure: CYSTOSCOPY/LEFT URETEROSCOPY AND LEFT RETROGRADE PYELOGRAM/HOLMIUM LASER/STENT PLACEMENT;  Surgeon: Billey Co, MD;  Location: [facility] ORS;  Service: Urology;  Laterality: Left;    EXCISION MORTON'S NEUROMA Right    gastric sleeve surgery  2013   with repair of hiatal hernia   REVISION OF GASTRIC SLEEVE TO BILIOPANCREATIC DIVERSION WITH DUODENAL SWITCH LAPAROSCOPIC  03/13/2018   right elbow surgery     right wrist surgery     TOTAL ABDOMINAL HYSTERECTOMY  2006    Social History   Tobacco Use   Smoking status: Former    Packs/day: 0.50    Years: 10.00    Additional pack years: 0.00    Total pack years: 5.00    Types: Cigarettes    Quit date: 1995    Years since quitting: 29.2   Smokeless tobacco: Never  Vaping Use   Vaping Use: Never used  Substance Use Topics   Alcohol use: Not Currently    Comment: social   Drug use: No     Medication list has been reviewed and updated.  Current Meds  Medication Sig   Acetaminophen (TYLENOL PO) Take 500 mg by mouth.   albuterol (VENTOLIN HFA) 108 (90 Base) MCG/ACT inhaler TAKE 2 PUFFS BY MOUTH EVERY 6 HOURS AS NEEDED FOR WHEEZE OR SHORTNESS OF BREATH   Ibuprofen 200 MG CAPS Take by mouth 3 (three) times daily.   Multiple Vitamins-Minerals (BARIATRIC MULTIVITAMINS/IRON PO) Take 1 tablet by mouth 2 (two) times daily.   NON FORMULARY CPAP at 10 cm H2O   valACYclovir (VALTREX) 1000 MG tablet TAKE 1 TABLET (1000 MG) BY MOUTH TWICE A DAY FOR 10 DAYS   [DISCONTINUED] celecoxib (CELEBREX) 100 MG capsule Take 100 mg by mouth 2 (two) times daily.       08/23/2022    2:06 PM 12/13/2021    9:20 AM 06/13/2021    1:25 PM 05/23/2020    9:17 AM  GAD 7 : Generalized Anxiety Score  Nervous, Anxious, on Edge 1 1 1 3   Control/stop worrying 1 1 1 3   Worry too much - different things 1 1 1 3   Trouble relaxing 1 1 1 3   Restless 0 1 0 0  Easily annoyed or irritable 2 2 2 3   Afraid - awful might happen 0 0 0 2  Total GAD 7 Score 6 7 6 17   Anxiety Difficulty Not difficult at all Somewhat difficult Somewhat difficult Not difficult at all       08/23/2022    2:06 PM 12/13/2021    9:20 AM 06/13/2021    1:25 PM  Depression screen  PHQ 2/9  Decreased Interest 0 2 2  Down, Depressed, Hopeless 0 1 1  PHQ - 2 Score 0 3 3  Altered sleeping 0 1 1  Tired, decreased energy 2 2 2   Change in  appetite 0 3 2  Feeling bad or failure about yourself  0 0 0  Trouble concentrating 0 1 1  Moving slowly or fidgety/restless 0 1 0  Suicidal thoughts 0 0 0  PHQ-9 Score 2 11 9   Difficult doing work/chores Not difficult at all Very difficult Not difficult at all    BP Readings from Last 3 Encounters:  08/23/22 112/70  06/13/22 120/66  05/09/22 119/74    Physical Exam Vitals and nursing note reviewed.  Constitutional:      Appearance: Normal appearance.  Cardiovascular:     Rate and Rhythm: Normal rate and regular rhythm.     Heart sounds: Normal heart sounds.  Pulmonary:     Effort: Pulmonary effort is normal.     Breath sounds: Normal breath sounds.  Abdominal:     General: Bowel sounds are normal. There is no distension.     Palpations: Abdomen is soft. There is no hepatomegaly or mass.     Tenderness: There is abdominal tenderness in the right upper quadrant. There is left CVA tenderness. There is no right CVA tenderness.     Hernia: No hernia is present.     Comments: Abdominal exam essentially normal today aside from mild RUQ tenderness/discomfort.  No hernia could be appreciated at the time of exam with Valsalva while supine and standing     Wt Readings from Last 3 Encounters:  08/23/22 216 lb (98 kg)  12/19/21 196 lb (88.9 kg)  12/13/21 212 lb (96.2 kg)    BP 112/70   Pulse 68   Temp 97.7 F (36.5 C) (Oral)   Ht 5\' 5"  (1.651 m)   Wt 216 lb (98 kg)   SpO2 98%   BMI 35.94 kg/m   Assessment and Plan:  1. Intermittent right upper quadrant abdominal pain No obvious cause for this problem at this time.  Story seems consistent with ventral hernia but not appreciated on exam today.  Also consider pain coming from scar tissue/adhesions from multiple surgeries within the abdominal cavity.  Patient reassured  that hernias are usually benign especially in early stages, and we should continue to monitor for future changes.    Up-to-date on colonoscopy.  All lab work normal July 2023.  I see no need for imaging at this time especially given the mild and intermittent nature of this problem.  Patient education printed regarding ventral hernias.  Advised healthy lifestyle measures including adequate hydration, good fiber intake, regular physical activity as tolerated.  Advised to present to ED if she is not able to reduce hernia in the future, or if pain becomes severe.   Return if symptoms worsen or fail to improve.   Partially dictated using Editor, commissioning. Any errors are unintentional.  Lupita Leash, PA-C, [facility]

## 2022-08-29 DIAGNOSIS — M25572 Pain in left ankle and joints of left foot: Secondary | ICD-10-CM | POA: Diagnosis not present

## 2022-08-29 DIAGNOSIS — R2689 Other abnormalities of gait and mobility: Secondary | ICD-10-CM | POA: Diagnosis not present

## 2022-09-14 DIAGNOSIS — M25572 Pain in left ankle and joints of left foot: Secondary | ICD-10-CM | POA: Diagnosis not present

## 2022-09-14 DIAGNOSIS — R2689 Other abnormalities of gait and mobility: Secondary | ICD-10-CM | POA: Diagnosis not present

## 2022-09-17 ENCOUNTER — Encounter: Payer: Self-pay | Admitting: Internal Medicine

## 2022-09-26 ENCOUNTER — Ambulatory Visit: Payer: BC Managed Care – PPO | Admitting: Podiatry

## 2022-10-15 ENCOUNTER — Ambulatory Visit: Payer: BC Managed Care – PPO | Admitting: Podiatry

## 2022-10-15 ENCOUNTER — Encounter: Payer: Self-pay | Admitting: Podiatry

## 2022-10-15 DIAGNOSIS — S86312D Strain of muscle(s) and tendon(s) of peroneal muscle group at lower leg level, left leg, subsequent encounter: Secondary | ICD-10-CM

## 2022-10-15 DIAGNOSIS — M722 Plantar fascial fibromatosis: Secondary | ICD-10-CM

## 2022-10-15 DIAGNOSIS — Z9889 Other specified postprocedural states: Secondary | ICD-10-CM

## 2022-10-16 NOTE — Progress Notes (Signed)
She presents today for follow-up of her physical therapy states that is still painful but is getting better she relates that is probably about 50 to 60% well still has some numbness in the leg.  Objective: Vital signs are stable she is alert and oriented x 3.  Still has some tenderness on palpation of the medial arch and of the surgical site on the lateral aspect of the foot where the peroneal tendons were repaired.  She has no open lesions or wounds.  Swelling has diminished considerably she is no longer on crutches.  Assessment: At this point she is doing much better with the pain and numbness in his left foot.  Plan: I imagine the pain in the left foot will continue to subside as long she continues her physical therapy.  The numbness in the left leg from the common peroneal nerve down is most likely either cast or injection especially considering she has some numbness on the bottom of her foot which would be the posterior tibial over the tibial nerve.  She he is going to continue massage therapy to her thigh right above her knee and moist heat.  I will follow-up with her in about 6 to 8 weeks.

## 2022-11-03 DIAGNOSIS — Z87891 Personal history of nicotine dependence: Secondary | ICD-10-CM | POA: Diagnosis not present

## 2022-11-03 DIAGNOSIS — R109 Unspecified abdominal pain: Secondary | ICD-10-CM | POA: Diagnosis not present

## 2022-11-03 DIAGNOSIS — R1011 Right upper quadrant pain: Secondary | ICD-10-CM | POA: Diagnosis not present

## 2022-11-03 DIAGNOSIS — R079 Chest pain, unspecified: Secondary | ICD-10-CM | POA: Diagnosis not present

## 2022-11-03 DIAGNOSIS — K432 Incisional hernia without obstruction or gangrene: Secondary | ICD-10-CM | POA: Diagnosis not present

## 2022-11-03 DIAGNOSIS — Z885 Allergy status to narcotic agent status: Secondary | ICD-10-CM | POA: Diagnosis not present

## 2022-11-04 DIAGNOSIS — R109 Unspecified abdominal pain: Secondary | ICD-10-CM | POA: Diagnosis not present

## 2022-11-22 ENCOUNTER — Encounter: Payer: Self-pay | Admitting: Podiatry

## 2022-11-22 DIAGNOSIS — R1011 Right upper quadrant pain: Secondary | ICD-10-CM | POA: Diagnosis not present

## 2022-11-23 NOTE — Telephone Encounter (Signed)
Handicap form filled out for pt

## 2022-11-28 DIAGNOSIS — R1084 Generalized abdominal pain: Secondary | ICD-10-CM | POA: Diagnosis not present

## 2022-11-28 DIAGNOSIS — G4733 Obstructive sleep apnea (adult) (pediatric): Secondary | ICD-10-CM | POA: Diagnosis not present

## 2022-11-28 DIAGNOSIS — M79671 Pain in right foot: Secondary | ICD-10-CM | POA: Diagnosis not present

## 2022-12-07 DIAGNOSIS — R079 Chest pain, unspecified: Secondary | ICD-10-CM | POA: Diagnosis not present

## 2022-12-07 DIAGNOSIS — N200 Calculus of kidney: Secondary | ICD-10-CM | POA: Diagnosis not present

## 2022-12-12 ENCOUNTER — Ambulatory Visit: Payer: BC Managed Care – PPO | Admitting: Podiatry

## 2022-12-13 ENCOUNTER — Other Ambulatory Visit: Payer: Self-pay | Admitting: Internal Medicine

## 2022-12-13 DIAGNOSIS — F3341 Major depressive disorder, recurrent, in partial remission: Secondary | ICD-10-CM

## 2022-12-13 NOTE — Telephone Encounter (Signed)
Unable to refill per protocol, Rx expired. Discontinued 10/15/22.  Requested Prescriptions  Pending Prescriptions Disp Refills   sertraline (ZOLOFT) 100 MG tablet [Pharmacy Med Name: SERTRALINE HCL 100 MG TABLET] 90 tablet 3    Sig: TAKE 1 TABLET BY MOUTH EVERY DAY     Psychiatry:  Antidepressants - SSRI - sertraline Failed - 12/13/2022  2:37 AM      Failed - AST in normal range and within 360 days    AST  Date Value Ref Range Status  12/13/2021 28 0 - 40 IU/L Final         Failed - ALT in normal range and within 360 days    ALT  Date Value Ref Range Status  12/13/2021 39 (H) 0 - 32 IU/L Final         Passed - Completed PHQ-2 or PHQ-9 in the last 360 days      Passed - Valid encounter within last 6 months    Recent Outpatient Visits           3 months ago Intermittent right upper quadrant abdominal pain   [facility] Mordecai Maes, [place] Alar, [place]   1 year ago Annual physical exam   [facility] at [facility], Nyoka Cowden, [place]   1 year ago Encounter for screening mammogram for breast cancer   [facility] at [facility], Nyoka Cowden, [place]   2 years ago Annual physical exam   [facility] at [facility], Nyoka Cowden, [place]   3 years ago Annual physical exam   [facility] at [facility], Nyoka Cowden, MD       Future Appointments             In 4 days Judithann Graves Nyoka Cowden, MD [facility] at [facility], [facility]

## 2022-12-14 DIAGNOSIS — M79671 Pain in right foot: Secondary | ICD-10-CM | POA: Diagnosis not present

## 2022-12-17 ENCOUNTER — Encounter: Payer: BC Managed Care – PPO | Admitting: Internal Medicine

## 2022-12-18 HISTORY — PX: OTHER SURGICAL HISTORY: SHX169

## 2022-12-19 DIAGNOSIS — M79672 Pain in left foot: Secondary | ICD-10-CM | POA: Diagnosis not present

## 2022-12-19 DIAGNOSIS — M79671 Pain in right foot: Secondary | ICD-10-CM | POA: Diagnosis not present

## 2022-12-25 DIAGNOSIS — G4733 Obstructive sleep apnea (adult) (pediatric): Secondary | ICD-10-CM | POA: Diagnosis not present

## 2022-12-25 DIAGNOSIS — R1084 Generalized abdominal pain: Secondary | ICD-10-CM | POA: Diagnosis not present

## 2022-12-25 DIAGNOSIS — Z8616 Personal history of COVID-19: Secondary | ICD-10-CM | POA: Diagnosis not present

## 2022-12-25 DIAGNOSIS — Z9049 Acquired absence of other specified parts of digestive tract: Secondary | ICD-10-CM | POA: Diagnosis not present

## 2022-12-25 DIAGNOSIS — Z79899 Other long term (current) drug therapy: Secondary | ICD-10-CM | POA: Diagnosis not present

## 2022-12-25 DIAGNOSIS — Z885 Allergy status to narcotic agent status: Secondary | ICD-10-CM | POA: Diagnosis not present

## 2022-12-25 DIAGNOSIS — Z85828 Personal history of other malignant neoplasm of skin: Secondary | ICD-10-CM | POA: Diagnosis not present

## 2022-12-25 DIAGNOSIS — G8918 Other acute postprocedural pain: Secondary | ICD-10-CM | POA: Diagnosis not present

## 2022-12-25 DIAGNOSIS — N189 Chronic kidney disease, unspecified: Secondary | ICD-10-CM | POA: Diagnosis not present

## 2022-12-25 DIAGNOSIS — Z791 Long term (current) use of non-steroidal anti-inflammatories (NSAID): Secondary | ICD-10-CM | POA: Diagnosis not present

## 2022-12-25 DIAGNOSIS — Z87891 Personal history of nicotine dependence: Secondary | ICD-10-CM | POA: Diagnosis not present

## 2022-12-25 DIAGNOSIS — K5651 Intestinal adhesions [bands], with partial obstruction: Secondary | ICD-10-CM | POA: Diagnosis not present

## 2022-12-26 ENCOUNTER — Encounter: Payer: Self-pay | Admitting: Internal Medicine

## 2023-01-02 DIAGNOSIS — M79671 Pain in right foot: Secondary | ICD-10-CM | POA: Diagnosis not present

## 2023-01-10 DIAGNOSIS — M79671 Pain in right foot: Secondary | ICD-10-CM | POA: Diagnosis not present

## 2023-01-16 DIAGNOSIS — M79671 Pain in right foot: Secondary | ICD-10-CM | POA: Diagnosis not present

## 2023-01-23 DIAGNOSIS — M79671 Pain in right foot: Secondary | ICD-10-CM | POA: Diagnosis not present

## 2023-01-30 DIAGNOSIS — M79671 Pain in right foot: Secondary | ICD-10-CM | POA: Diagnosis not present

## 2023-02-13 DIAGNOSIS — M79671 Pain in right foot: Secondary | ICD-10-CM | POA: Diagnosis not present

## 2023-02-27 ENCOUNTER — Ambulatory Visit: Payer: BC Managed Care – PPO | Admitting: Family Medicine

## 2023-02-28 DIAGNOSIS — M7670 Peroneal tendinitis, unspecified leg: Secondary | ICD-10-CM | POA: Diagnosis not present

## 2023-02-28 DIAGNOSIS — M79671 Pain in right foot: Secondary | ICD-10-CM | POA: Diagnosis not present

## 2023-02-28 DIAGNOSIS — M79672 Pain in left foot: Secondary | ICD-10-CM | POA: Diagnosis not present

## 2023-04-04 ENCOUNTER — Ambulatory Visit: Payer: BC Managed Care – PPO | Admitting: Family Medicine

## 2023-04-16 ENCOUNTER — Ambulatory Visit: Payer: BC Managed Care – PPO | Admitting: Family Medicine

## 2023-04-16 ENCOUNTER — Ambulatory Visit: Payer: BC Managed Care – PPO | Attending: Family Medicine

## 2023-04-16 ENCOUNTER — Encounter: Payer: Self-pay | Admitting: Family Medicine

## 2023-04-16 VITALS — BP 95/61 | HR 62 | Resp 18 | Ht 65.0 in | Wt 203.3 lb

## 2023-04-16 DIAGNOSIS — R350 Frequency of micturition: Secondary | ICD-10-CM

## 2023-04-16 DIAGNOSIS — G4733 Obstructive sleep apnea (adult) (pediatric): Secondary | ICD-10-CM | POA: Diagnosis not present

## 2023-04-16 DIAGNOSIS — Z7689 Persons encountering health services in other specified circumstances: Secondary | ICD-10-CM

## 2023-04-16 DIAGNOSIS — R002 Palpitations: Secondary | ICD-10-CM

## 2023-04-16 DIAGNOSIS — R0602 Shortness of breath: Secondary | ICD-10-CM

## 2023-04-16 DIAGNOSIS — B009 Herpesviral infection, unspecified: Secondary | ICD-10-CM

## 2023-04-16 DIAGNOSIS — M8588 Other specified disorders of bone density and structure, other site: Secondary | ICD-10-CM

## 2023-04-16 DIAGNOSIS — F3341 Major depressive disorder, recurrent, in partial remission: Secondary | ICD-10-CM | POA: Diagnosis not present

## 2023-04-16 DIAGNOSIS — R001 Bradycardia, unspecified: Secondary | ICD-10-CM

## 2023-04-16 DIAGNOSIS — R3915 Urgency of urination: Secondary | ICD-10-CM

## 2023-04-16 DIAGNOSIS — I890 Lymphedema, not elsewhere classified: Secondary | ICD-10-CM

## 2023-04-16 DIAGNOSIS — Z0000 Encounter for general adult medical examination without abnormal findings: Secondary | ICD-10-CM

## 2023-04-16 DIAGNOSIS — N393 Stress incontinence (female) (male): Secondary | ICD-10-CM

## 2023-04-16 DIAGNOSIS — N3941 Urge incontinence: Secondary | ICD-10-CM

## 2023-04-16 LAB — POCT URINALYSIS DIPSTICK
Bilirubin, UA: NEGATIVE
Blood, UA: NEGATIVE
Glucose, UA: NEGATIVE
Ketones, UA: NEGATIVE
Leukocytes, UA: NEGATIVE
Nitrite, UA: NEGATIVE
Protein, UA: NEGATIVE
Spec Grav, UA: 1.01 (ref 1.010–1.025)
Urobilinogen, UA: 0.2 U/dL
pH, UA: 6 (ref 5.0–8.0)

## 2023-04-16 MED ORDER — ALBUTEROL SULFATE HFA 108 (90 BASE) MCG/ACT IN AERS
1.0000 | INHALATION_SPRAY | Freq: Four times a day (QID) | RESPIRATORY_TRACT | 2 refills | Status: DC | PRN
Start: 2023-04-16 — End: 2023-07-15

## 2023-04-16 MED ORDER — VALACYCLOVIR HCL 1 G PO TABS: 1000.0000 mg | ORAL_TABLET | Freq: Two times a day (BID) | ORAL | 0 refills | Status: DC

## 2023-04-16 NOTE — Assessment & Plan Note (Signed)
Wears CPAP consistently.

## 2023-04-16 NOTE — Progress Notes (Signed)
New patient visit   Patient: Meghan Weeks   DOB: 1970/06/11   52 y.o. Female  MRN: [id number] Visit Date: 04/16/2023  Today's healthcare provider: Sherlyn Hay, DO   Chief Complaint  Patient presents with   Establish Care        Subjective    Meghan Weeks is a 52 y.o. female who presents today as a new patient to establish care.  HPI HPI     Establish Care    Additional comments:        Last edited by Ashok Cordia, CMA on 04/16/2023  2:59 PM.      Mammogram done 08/17/2022 - showed a small, benign-appearing cyst, decreased in size from previous scan; BI-RADS 2.  Follow-up in 1 year Colonoscopy done 07/14/2021 -1 tubular adenoma identified negative for high-grade dysplasia and malignancy; will be due for repeat in 5 years. Last CMP done 11/03/2022; alk phos elevated at 133 Lipase on 11/03/2022 elevated at 58  Patient complains of right upper quadrant abdominal pain The discomfort is described as a sensation of pressure, particularly noticeable when sitting, and is localized to the right upper quadrant. The patient reports a sensation of bloating and fullness after eating, and a change in bowel habits from daily to every two to three days since December. The patient denies constipation or diarrhea, describing the stool as normal in consistency.  The patient also reports a history of urinary frequency over the past few days, suggestive of a urinary tract infection. There is no reported incontinence, except during episodes of sneezing or coughing. The patient has been performing Kegel exercises occasionally but not regularly.  The patient has a history of hysterectomy and oophorectomy around 2009, leading to early menopause. There is a concern about hormone levels and potential effects on muscle weakness and ligament health. However, the patient reports no significant menopausal symptoms currently.  The patient also reports a history of lymphedema, with persistent  swelling in the foot since recent foot surgery. The swelling is described as 'poofy' and does not significantly reduce with the use of a lymph pump or vibration therapy. The patient also reports episodes of palpitations a couple of times a week, which she attributes to anxiety.  The patient has a history of sleep apnea and uses a CPAP machine regularly. She reports episodes of sudden sleepiness, which she describes as 'sleepy spells,' but these have decreased since her surgeries. The patient also reports a decrease in sex drive since iatrogenic menopause and a history of depression, for which she is not currently on medication.  The patient has a history of psoriasis, which is currently flaring up. She also reports occasional episodes of sudden sweating and feeling hot, which she describes as 'super, super, super hot,' followed by a need to lie down until the feeling passes. These episodes are infrequent but have been occurring for the past couple of weeks.   Past Medical History:  Diagnosis Date   ADHD    Allergy    seasonal   Anxiety    Bronchitis    Car sickness    Cholelithiasis    Complication of anesthesia    Depression    Establishing care with new doctor, encounter for 04/29/2023   History of basal cell cancer    forehead and nose   Hypertension    Kidney atrophy    right kidney shrinking?   Neuroma of foot    PONV (postoperative nausea and vomiting)  Psoriasis    Renal stones 03/2017   right    Sleep apnea 2007 or 2008   CPAP   Past Surgical History:  Procedure Laterality Date   ABDOMINAL HYSTERECTOMY     ANKLE SURGERY Left 05/04/2022   [facility]   CHOLE[facility]     COLONOSCOPY     COLONOSCOPY WITH PROPOFOL N/A 07/14/2021   Procedure: COLONOSCOPY WITH PROPOFOL;  Surgeon: Toney Reil, MD;  Location: [facility] ENDOSCOPY;  Service: Gastroenterology;  Laterality: N/A;   CYSTOSCOPY/URETEROSCOPY/HOLMIUM  LASER/STENT PLACEMENT Left 04/24/2019   Procedure: CYSTOSCOPY/LEFT URETEROSCOPY AND LEFT RETROGRADE PYELOGRAM/HOLMIUM LASER/STENT PLACEMENT;  Surgeon: Sondra Come, MD;  Location: [facility] ORS;  Service: Urology;  Laterality: Left;   EXCISION MORTON'S NEUROMA Right    gastric sleeve surgery  2013   with repair of hiatal hernia   HERNIA REPAIR  2013   hiatal   PR LAPS ABD PRTM&OMENTUM DX W/WO SPEC BR/WA SPX  PR LAPS ABD PRTM&OMENTUM DX W/WO SPEC BR/WA SPX  DIAGNOSTIC LAPAROSCOPY   12/18/2022   REVISION OF GASTRIC SLEEVE TO BILIOPANCREATIC DIVERSION WITH DUODENAL SWITCH LAPAROSCOPIC  03/13/2018   right elbow surgery     right wrist surgery     SMALL INTESTINE SURGERY  2019 or 2020   duodenal switch   TOTAL ABDOMINAL HYSTERECTOMY  2006   TUBAL LIGATION  1995   Family Status  Relation Name Status   Mother Ashok Norris Alive   Father William Dalton Alive   Sister Mariane Masters (Not Specified)   MGM Harriette Ohara (Not Specified)   PGM Jess Barters (Not Specified)   Daughter Michaelle Copas (Not Specified)   Son Donnise Elmi (Not Specified)   Brother Ailene Ravel (Not Specified)   Brother Clover Mealy (Not Specified)   Brother Maggie Font (Not Specified)   Sister Alena Bills (Not Specified)   Brother Woody. (Not Specified)   Neg Hx  (Not Specified)  No partnership data on file   Family History  Problem Relation Age of Onset   Hematuria Mother    Cancer Mother    COPD Mother    Depression Mother    Drug abuse Mother    Varicose Veins Mother    Diabetes Father    Hypertension Father    Diabetes Sister    Hypertension Sister    Cancer Sister    Depression Sister    Obesity Sister    Varicose Veins Sister    Colon cancer Maternal Grandmother    Uterine cancer Maternal Grandmother    Cancer Maternal Grandmother    Stroke Paternal Grandmother    ADD / ADHD Daughter    Depression Daughter    ADD / ADHD Son    Depression Son    Drug abuse Son     Depression Brother    Drug abuse Brother    Depression Brother    Diabetes Brother    Drug abuse Brother    Depression Brother    Drug abuse Brother    Diabetes Sister    Obesity Sister    Drug abuse Brother    Kidney cancer Neg Hx    Bladder Cancer Neg Hx    Breast cancer Neg Hx    Social History   Socioeconomic History   Marital status: Married    Spouse name: Not on file   Number of children: Not on file   Years of education: Not on file  Highest education level: GED or equivalent  Occupational History   Not on file  Tobacco Use   Smoking status: Former    Current packs/day: 0.00    Average packs/day: 0.5 packs/day for 10.0 years (5.0 ttl pk-yrs)    Types: Cigarettes    Start date: 4    Quit date: 58    Years since quitting: 29.9   Smokeless tobacco: Never  Vaping Use   Vaping status: Never Used  Substance and Sexual Activity   Alcohol use: Not Currently    Comment: social   Drug use: No   Sexual activity: Not on file  Other Topics Concern   Not on file  Social History Narrative   Not on file   Social Determinants of Health   Financial Resource Strain: Low Risk  (04/15/2023)   Overall Financial Resource Strain (CARDIA)    Difficulty of Paying Living Expenses: Not very hard  Food Insecurity: No Food Insecurity (04/15/2023)   Hunger Vital Sign    Worried About Running Out of Food in the Last Year: Never true    Ran Out of Food in the Last Year: Never true  Transportation Needs: No Transportation Needs (04/15/2023)   PRAPARE - Administrator, Civil Service (Medical): No    Lack of Transportation (Non-Medical): No  Physical Activity: Unknown (04/15/2023)   Exercise Vital Sign    Days of Exercise per Week: 0 days    Minutes of Exercise per Session: Not on file  Stress: Stress Concern Present (04/15/2023)   [employer] of Occupational Health - Occupational Stress Questionnaire    Feeling of Stress : To some extent  Social  Connections: Moderately Isolated (04/15/2023)   Social Connection and Isolation Panel [NHANES]    Frequency of Communication with Friends and Family: Three times a week    Frequency of Social Gatherings with Friends and Family: Once a week    Attends Religious Services: Never    Database administrator or Organizations: No    Attends Engineer, structural: Not on file    Marital Status: Married   Outpatient Medications Prior to Visit  Medication Sig   Acetaminophen (TYLENOL PO) Take 500 mg by mouth.   Ibuprofen 200 MG CAPS Take by mouth 3 (three) times daily.   Multiple Vitamins-Minerals (BARIATRIC MULTIVITAMINS/IRON PO) Take 1 tablet by mouth 2 (two) times daily.   NON FORMULARY CPAP at 10 cm H2O   [DISCONTINUED] albuterol (VENTOLIN HFA) 108 (90 Base) MCG/ACT inhaler TAKE 2 PUFFS BY MOUTH EVERY 6 HOURS AS NEEDED FOR WHEEZE OR SHORTNESS OF BREATH   [DISCONTINUED] valACYclovir (VALTREX) 1000 MG tablet TAKE 1 TABLET (1000 MG) BY MOUTH TWICE A DAY FOR 10 DAYS   hydrochlorothiazide (MICROZIDE) 12.5 MG capsule TAKE 1 CAPSULE (12.5 MG TOTAL) BY MOUTH DAILY AS NEEDED. (Patient not taking: Reported on 08/23/2022)   No facility-administered medications prior to visit.   Allergies  Allergen Reactions   Codeine Other (See Comments)    In Cough Syrup- Caused Migraines   Levonorgestrel-Ethinyl Estrad Other (See Comments)    Tory Emerald) birth control patch "made me sick"     There is no immunization history on file for this patient.  Health Maintenance  Topic Date Due   HIV Screening  Never done   DTaP/Tdap/Td (1 - Tdap) Never done   Zoster Vaccines- Shingrix (1 of 2) Never done   INFLUENZA VACCINE  Never done   MAMMOGRAM  08/17/2023   Colonoscopy  07/14/2026  Hepatitis C Screening  Completed   HPV VACCINES  Aged Out   COVID-19 Vaccine  Discontinued    Patient Care Team: Solace Wendorff, Monico Blitz, DO as PCP - General (Family Medicine) Geoffry Paradise, MD as Referring Physician  [facility]) [place], Annye Rusk, DPM as Consulting Physician (Podiatry) Pearlstein, Juanda Crumble, MD as Referring Physician (Dermatology) Pollyann Glen, MD as Referring Physician (Dermatology)  Review of Systems  Constitutional:  Negative for appetite change, chills, fatigue and fever.  Respiratory:  Negative for chest tightness and shortness of breath.   Cardiovascular:  Positive for palpitations and leg swelling (baseline; hx lymphedema). Negative for chest pain.  Gastrointestinal:  Positive for abdominal pain (described intermitten spasm pain in suprapubic region). Negative for nausea and vomiting.  Genitourinary:  Positive for frequency and urgency.       +stress incontinence  Neurological:  Negative for dizziness and weakness.  Psychiatric/Behavioral:  Negative for dysphoric mood. The patient is not nervous/anxious.         Objective    BP 95/61   Pulse 62   Resp 18   Ht 5\' 5"  (1.651 m)   Wt 203 lb 4.8 oz (92.2 kg)   SpO2 100%   BMI 33.83 kg/m     Physical Exam Constitutional:      Appearance: Normal appearance.  HENT:     Head: Normocephalic and atraumatic.  Eyes:     General: No scleral icterus.    Extraocular Movements: Extraocular movements intact.     Conjunctiva/sclera: Conjunctivae normal.  Cardiovascular:     Rate and Rhythm: Normal rate and regular rhythm.     Pulses: Normal pulses.     Heart sounds: Normal heart sounds.  Pulmonary:     Effort: Pulmonary effort is normal. No respiratory distress.     Breath sounds: Normal breath sounds.  Abdominal:     General: Bowel sounds are normal. There is no distension.     Palpations: Abdomen is soft. There is no mass.     Tenderness: There is abdominal tenderness (suprapubic). There is no guarding.  Musculoskeletal:     Right lower leg: Edema (trace) present.     Left lower leg: Edema (trace) present.  Skin:    General: Skin is warm and dry.  Neurological:     Mental Status: She is alert and oriented to person,  place, and time. Mental status is at baseline.  Psychiatric:        Mood and Affect: Mood normal.        Behavior: Behavior normal.     Depression Screen    04/16/2023    3:02 PM 08/23/2022    2:06 PM 12/13/2021    9:20 AM 06/13/2021    1:25 PM  PHQ 2/9 Scores  PHQ - 2 Score 3 0 3 3  PHQ- 9 Score 7 2 11 9    Results for orders placed or performed in visit on 04/16/23  Urine Culture   Specimen: Urine   Urine  Result Value Ref Range   Urine Culture, Routine Final report    Organism ID, Bacteria No growth   Specimen status report  Result Value Ref Range   specimen status report Comment   POCT urinalysis dipstick  Result Value Ref Range   Color, UA light yellow    Clarity, UA clear    Glucose, UA Negative Negative   Bilirubin, UA Negative    Ketones, UA Negative    Spec Grav, UA 1.010 1.010 - 1.025  Blood, UA Negative    pH, UA 6.0 5.0 - 8.0   Protein, UA Negative Negative   Urobilinogen, UA 0.2 0.2 or 1.0 E.U./dL   Nitrite, UA Negative    Leukocytes, UA Negative Negative   Appearance     Odor     DEXA scan: osteopenia, T score -2 (2017)   Assessment & Plan     Recurrent major depressive disorder, in partial remission (HCC) Assessment & Plan: Symptoms of depression and anxiety, including social withdrawal and irritability. Not currently on psychiatric medications. Discussed potential benefits of resuming psychiatric medications and considering referral to a mental health specialist. - Discuss potential benefits of resuming psychiatric medications; patient declines today. - Consider referral to mental health specialist; patient declines today.   Establishing care with new doctor, encounter for Assessment & Plan: Up to date on mammogram and colonoscopy. Last DEXA scan in 2017 showed osteopenia. Needs refills for albuterol inhaler and Valtrex (famciclovir). - Order DEXA scan - Refill albuterol inhaler - Request records from previous providers for recent imaging and  tests.   Obstructive sleep apnea syndrome Assessment & Plan: Wears CPAP consistently.   Urinary frequency Assessment & Plan: Increased frequency of urination over the past few days without incontinence, except when sneezing or coughing. Concern for recurrent UTI given history of kidney issues. - Obtain urine sample for urinalysis and culture  Orders: -     POCT urinalysis dipstick -     Urine Culture  Urinary urgency -     POCT urinalysis dipstick -     Urine Culture -     Specimen status report  Stress incontinence in female Assessment & Plan: Occasional incontinence when sneezing or coughing. Advised on Kegel exercises to strengthen pelvic floor muscles. - Recommend regular Kegel exercises   Osteopenia of lumbar spine Assessment & Plan: Concerned about hormone levels post-hysterectomy and oophorectomy. Routine hormone checks not typically necessary unless symptomatic. Discussed risks of hormone replacement therapy (HRT) including blood clots, stroke, and heart attack. HRT reserved for severe menopausal symptoms. - Monitor for symptoms of hormone deficiency - DEXA scan to monitor bone density  Orders: -     DG Bone Density; Future  Palpitations Assessment & Plan: Dizziness and lightheadedness, especially post-COVID-19. Blood pressure on the lower side. Discussed orthostatic hypotension, as well as bradycardia, and its potential risks. - Increase fluid intake - Monitor blood pressure regularly  Orders: -     LONG TERM MONITOR (3-14 DAYS); Future  Bradycardia Assessment & Plan: Palpitations and heart rate in the 50s. No recent cardiology follow-up. Need for extended heart monitoring to evaluate symptoms. - Order heart monitor for extended monitoring - Consider cardiology referral based on heart monitor results  Orders: -     LONG TERM MONITOR (3-14 DAYS); Future  Herpes Assessment & Plan: - Refill Valtrex (famciclovir)  Orders: -     valACYclovir HCl; Take 1  tablet (1,000 mg total) by mouth 2 (two) times daily.  Dispense: 20 tablet; Refill: 0  Shortness of breath -     Albuterol Sulfate HFA; Inhale 1-2 puffs into the lungs every 6 (six) hours as needed for wheezing or shortness of breath.  Dispense: 6.7 each; Refill: 2  Lymphedema Assessment & Plan: Persistent swelling in feet and legs post-foot surgery. Lymph pump and vibration therapy provide temporary relief. Lymphedema requires ongoing management. - Continue using lymph pump regularly - Consider referral to vascular specialist if symptoms worsen    Return in about 6 weeks (around 05/28/2023)  for Bradycardia/palp.     I discussed the assessment and treatment plan with the patient  The patient was provided an opportunity to ask questions and all were answered. The patient agreed with the plan and demonstrated an understanding of the instructions.   The patient was advised to call back or seek an in-person evaluation if the symptoms worsen or if the condition fails to improve as anticipated.    Sherlyn Hay, DO  [facility] [phone] (phone) [phone] (fax)  [facility]

## 2023-04-17 ENCOUNTER — Encounter: Payer: Self-pay | Admitting: Family Medicine

## 2023-04-18 ENCOUNTER — Ambulatory Visit: Payer: BC Managed Care – PPO | Admitting: Family Medicine

## 2023-04-18 LAB — URINE CULTURE: Organism ID, Bacteria: NO GROWTH

## 2023-04-18 LAB — SPECIMEN STATUS REPORT

## 2023-04-21 DIAGNOSIS — R001 Bradycardia, unspecified: Secondary | ICD-10-CM

## 2023-04-21 DIAGNOSIS — R002 Palpitations: Secondary | ICD-10-CM | POA: Diagnosis not present

## 2023-04-29 ENCOUNTER — Encounter: Payer: Self-pay | Admitting: Family Medicine

## 2023-04-29 DIAGNOSIS — R001 Bradycardia, unspecified: Secondary | ICD-10-CM | POA: Insufficient documentation

## 2023-04-29 DIAGNOSIS — N393 Stress incontinence (female) (male): Secondary | ICD-10-CM | POA: Insufficient documentation

## 2023-04-29 DIAGNOSIS — M8588 Other specified disorders of bone density and structure, other site: Secondary | ICD-10-CM | POA: Insufficient documentation

## 2023-04-29 DIAGNOSIS — R002 Palpitations: Secondary | ICD-10-CM | POA: Insufficient documentation

## 2023-04-29 DIAGNOSIS — B009 Herpesviral infection, unspecified: Secondary | ICD-10-CM | POA: Insufficient documentation

## 2023-04-29 DIAGNOSIS — I890 Lymphedema, not elsewhere classified: Secondary | ICD-10-CM | POA: Insufficient documentation

## 2023-04-29 DIAGNOSIS — R350 Frequency of micturition: Secondary | ICD-10-CM | POA: Insufficient documentation

## 2023-04-29 DIAGNOSIS — Z7689 Persons encountering health services in other specified circumstances: Secondary | ICD-10-CM

## 2023-04-29 HISTORY — DX: Persons encountering health services in other specified circumstances: Z76.89

## 2023-04-29 NOTE — Assessment & Plan Note (Signed)
Symptoms of depression and anxiety, including social withdrawal and irritability. Not currently on psychiatric medications. Discussed potential benefits of resuming psychiatric medications and considering referral to a mental health specialist. - Discuss potential benefits of resuming psychiatric medications; patient declines today. - Consider referral to mental health specialist; patient declines today.

## 2023-04-29 NOTE — Assessment & Plan Note (Addendum)
Dizziness and lightheadedness, especially post-COVID-19. Blood pressure on the lower side. Discussed orthostatic hypotension, as well as bradycardia, and its potential risks. - Increase fluid intake - Monitor blood pressure regularly

## 2023-04-29 NOTE — Assessment & Plan Note (Signed)
Occasional incontinence when sneezing or coughing. Advised on Kegel exercises to strengthen pelvic floor muscles. - Recommend regular Kegel exercises

## 2023-04-29 NOTE — Assessment & Plan Note (Signed)
Concerned about hormone levels post-hysterectomy and oophorectomy. Routine hormone checks not typically necessary unless symptomatic. Discussed risks of hormone replacement therapy (HRT) including blood clots, stroke, and heart attack. HRT reserved for severe menopausal symptoms. - Monitor for symptoms of hormone deficiency - DEXA scan to monitor bone density

## 2023-04-29 NOTE — Assessment & Plan Note (Signed)
Palpitations and heart rate in the 50s. No recent cardiology follow-up. Need for extended heart monitoring to evaluate symptoms. - Order heart monitor for extended monitoring - Consider cardiology referral based on heart monitor results

## 2023-04-29 NOTE — Assessment & Plan Note (Signed)
Increased frequency of urination over the past few days without incontinence, except when sneezing or coughing. Concern for recurrent UTI given history of kidney issues. - Obtain urine sample for urinalysis and culture

## 2023-04-29 NOTE — Assessment & Plan Note (Signed)
-   Refill Valtrex (famciclovir)

## 2023-04-29 NOTE — Assessment & Plan Note (Signed)
Up to date on mammogram and colonoscopy. Last DEXA scan in 2017 showed osteopenia. Needs refills for albuterol inhaler and Valtrex (famciclovir). - Order DEXA scan - Refill albuterol inhaler - Request records from previous providers for recent imaging and tests.

## 2023-04-29 NOTE — Assessment & Plan Note (Signed)
Persistent swelling in feet and legs post-foot surgery. Lymph pump and vibration therapy provide temporary relief. Lymphedema requires ongoing management. - Continue using lymph pump regularly - Consider referral to vascular specialist if symptoms worsen

## 2023-05-06 DIAGNOSIS — D2262 Melanocytic nevi of left upper limb, including shoulder: Secondary | ICD-10-CM | POA: Diagnosis not present

## 2023-05-06 DIAGNOSIS — Z85828 Personal history of other malignant neoplasm of skin: Secondary | ICD-10-CM | POA: Diagnosis not present

## 2023-05-06 DIAGNOSIS — L570 Actinic keratosis: Secondary | ICD-10-CM | POA: Diagnosis not present

## 2023-05-06 DIAGNOSIS — D2261 Melanocytic nevi of right upper limb, including shoulder: Secondary | ICD-10-CM | POA: Diagnosis not present

## 2023-05-06 DIAGNOSIS — D225 Melanocytic nevi of trunk: Secondary | ICD-10-CM | POA: Diagnosis not present

## 2023-05-15 ENCOUNTER — Ambulatory Visit
Admission: EM | Admit: 2023-05-15 | Discharge: 2023-05-15 | Disposition: A | Discharge status: Home / Self Care | Payer: BC Managed Care – PPO | Attending: Emergency Medicine | Admitting: Emergency Medicine

## 2023-05-15 DIAGNOSIS — J069 Acute upper respiratory infection, unspecified: Secondary | ICD-10-CM | POA: Diagnosis not present

## 2023-05-15 LAB — GROUP A STREP BY PCR: Group A Strep by PCR: NOT DETECTED

## 2023-05-15 LAB — SARS CORONAVIRUS 2 BY RT PCR: SARS Coronavirus 2 by RT PCR: NEGATIVE

## 2023-05-15 MED ORDER — IPRATROPIUM BROMIDE 0.06 % NA SOLN: 2.0000 | Freq: Four times a day (QID) | NASAL | 12 refills | Status: AC

## 2023-05-15 NOTE — ED Triage Notes (Signed)
Sx x 2 days  Sore throat Right ear pain No fever.

## 2023-05-15 NOTE — Discharge Instructions (Addendum)
Your testing today for strep and COVID were both negative.  I do believe you have a viral respiratory infection causing your symptoms.  Please use over-the-counter Tylenol and/or ibuprofen according to the package instructions as needed for any fever or pain.  Use the Atrovent nasal spray, 2 squirts up each nostril every 6 hours, as needed for nasal congestion, runny nose, or postnasal drip.  You may use over-the-counter Chloraseptic or Sucrets lozenges, no more than 1 lozenge every 2 hours as the menthol may give you diarrhea, to help soothe your throat.  Gargle with salt water can also be helpful.  Mix 1 tablespoon of table salt in 8 ounces of warm water, gargle and spit.  If you develop any new or worsening symptoms please return for reevaluation or see your primary care provider.

## 2023-05-15 NOTE — ED Provider Notes (Signed)
[facility]    CSN: [id number] Arrival date & time: 05/15/23  1724      History   Chief Complaint Chief Complaint  Patient presents with   Sore Throat   Otalgia    HPI Meghan Weeks is a 52 y.o. female.   HPI  55 old female with a past medical history significant for ADHD, anxiety, hypertension, sleep apnea, and psoriasis presents for evaluation of sore throat and right ear pain that started 2 days ago.  She also endorses some mild nasal congestion but she denies fever, runny nose, or cough.  She also denies any drainage from her right ear, changes to her hearing, or ringing.  Past Medical History:  Diagnosis Date   ADHD    Allergy    seasonal   Anxiety    Bronchitis    Car sickness    Cholelithiasis    Complication of anesthesia    Depression    Establishing care with new doctor, encounter for 04/29/2023   History of basal cell cancer    forehead and nose   Hypertension    Kidney atrophy    right kidney shrinking?   Neuroma of foot    PONV (postoperative nausea and vomiting)    Psoriasis    Renal stones 03/2017   right    Sleep apnea 2007 or 2008   CPAP    Patient Active Problem List   Diagnosis Date Noted   Urinary frequency 04/29/2023   Stress incontinence in female 04/29/2023   Osteopenia of lumbar spine 04/29/2023   Palpitations 04/29/2023   Bradycardia 04/29/2023   Herpes 04/29/2023   Lymphedema 04/29/2023   Establishing care with new doctor, encounter for 04/29/2023   Polyp of ascending colon    Family history of colonic polyps    Pain in joint of right hip 02/15/2021   S/P TAH-BSO 05/22/2020   Recurrent major depressive disorder, in partial remission (HCC) 01/21/2019   Bariatric surgery status 12/16/2017   Sleep apnea    Psoriasis    Neuroma of foot    History of basal cell cancer    Chronic pain of right knee 05/18/2016   Rectocele 06/25/2011    Past Surgical History:  Procedure Laterality Date   ABDOMINAL  HYSTERECTOMY     ANKLE SURGERY Left 05/04/2022   BASAL CELL [facility]  [facility]   CHOLECYSTECTOMY     COLONOSCOPY     COLONOSCOPY WITH PROPOFOL N/A 07/14/2021   Procedure: COLONOSCOPY WITH PROPOFOL;  Surgeon: Toney Reil, MD;  Location: [facility] ENDOSCOPY;  Service: Gastroenterology;  Laterality: N/A;   CYSTOSCOPY/URETEROSCOPY/HOLMIUM LASER/STENT PLACEMENT Left 04/24/2019   Procedure: CYSTOSCOPY/LEFT URETEROSCOPY AND LEFT RETROGRADE PYELOGRAM/HOLMIUM LASER/STENT PLACEMENT;  Surgeon: Sondra Come, MD;  Location: [facility] ORS;  Service: Urology;  Laterality: Left;   EXCISION MORTON'S NEUROMA Right    gastric sleeve surgery  2013   with repair of hiatal hernia   HERNIA REPAIR  2013   hiatal   PR LAPS ABD PRTM&OMENTUM DX W/WO SPEC BR/WA SPX  PR LAPS ABD PRTM&OMENTUM DX W/WO SPEC BR/WA SPX  DIAGNOSTIC LAPAROSCOPY   12/18/2022   REVISION OF GASTRIC SLEEVE TO BILIOPANCREATIC DIVERSION WITH DUODENAL SWITCH LAPAROSCOPIC  03/13/2018   right elbow surgery     right wrist surgery     SMALL INTESTINE SURGERY  2019 or 2020   duodenal switch   TOTAL ABDOMINAL HYSTERECTOMY  2006   TUBAL LIGATION  1995  OB History   No obstetric history on file.      Home Medications    Prior to Admission medications   Medication Sig Start Date End Date Taking? Authorizing Provider  ipratropium (ATROVENT) 0.06 % nasal spray Place 2 sprays into both nostrils 4 (four) times daily. 05/15/23  Yes Becky Augusta, NP  Acetaminophen (TYLENOL PO) Take 500 mg by mouth.    [provider]  albuterol (VENTOLIN HFA) 108 (90 Base) MCG/ACT inhaler Inhale 1-2 puffs into the lungs every 6 (six) hours as needed for wheezing or shortness of breath. 04/16/23   Sherlyn Hay, DO  hydrochlorothiazide (MICROZIDE) 12.5 MG capsule TAKE 1 CAPSULE (12.5 MG TOTAL) BY MOUTH DAILY AS NEEDED. Patient not taking: Reported on 08/23/2022 04/16/22   Reubin Milan, MD  Ibuprofen 200 MG CAPS  Take by mouth 3 (three) times daily.    [provider]  Multiple Vitamins-Minerals (BARIATRIC MULTIVITAMINS/IRON PO) Take 1 tablet by mouth 2 (two) times daily.    [provider]  NON FORMULARY CPAP at 10 cm H2O    [provider]  valACYclovir (VALTREX) 1000 MG tablet Take 1 tablet (1,000 mg total) by mouth 2 (two) times daily. 04/16/23   Sherlyn Hay, DO  promethazine (PHENERGAN) 12.5 MG tablet Take 1 tablet (12.5 mg total) by mouth every 6 (six) hours as needed for nausea or vomiting. 04/21/19 05/16/19  Sondra Come, MD    Family History Family History  Problem Relation Age of Onset   Hematuria Mother    Cancer Mother    COPD Mother    Depression Mother    Drug abuse Mother    Varicose Veins Mother    Diabetes Father    Hypertension Father    Diabetes Sister    Hypertension Sister    Cancer Sister    Depression Sister    Obesity Sister    Varicose Veins Sister    Colon cancer Maternal Grandmother    Uterine cancer Maternal Grandmother    Cancer Maternal Grandmother    Stroke Paternal Grandmother    ADD / ADHD Daughter    Depression Daughter    ADD / ADHD Son    Depression Son    Drug abuse Son    Depression Brother    Drug abuse Brother    Depression Brother    Diabetes Brother    Drug abuse Brother    Depression Brother    Drug abuse Brother    Diabetes Sister    Obesity Sister    Drug abuse Brother    Kidney cancer Neg Hx    Bladder Cancer Neg Hx    Breast cancer Neg Hx     Social History Social History   Tobacco Use   Smoking status: Former    Current packs/day: 0.00    Average packs/day: 0.5 packs/day for 10.0 years (5.0 ttl pk-yrs)    Types: Cigarettes    Start date: 22    Quit date: 1995    Years since quitting: 29.9   Smokeless tobacco: Never  Vaping Use   Vaping status: Never Used  Substance Use Topics   Alcohol use: Not Currently    Comment: social   Drug use: No     Allergies   Codeine and  Levonorgestrel-ethinyl estrad   Review of Systems Review of Systems  Constitutional:  Negative for fever.  HENT:  Positive for congestion, ear pain and sore throat. Negative for ear discharge, hearing loss, rhinorrhea  and tinnitus.   Respiratory:  Negative for cough.      Physical Exam Triage Vital Signs ED Triage Vitals  Encounter Vitals Group     BP      Systolic BP Percentile      Diastolic BP Percentile      Pulse      Resp      Temp      Temp src      SpO2      Weight      Height      Head Circumference      Peak Flow      Pain Score      Pain Loc      Pain Education      Exclude from Growth Chart    No data found.  Updated Vital Signs BP 116/73 (BP Location: Right Arm)   Pulse (!) 54   Temp 98.1 F (36.7 C) (Oral)   Resp 17   SpO2 97%   Visual Acuity Right Eye Distance:   Left Eye Distance:   Bilateral Distance:    Right Eye Near:   Left Eye Near:    Bilateral Near:     Physical Exam Vitals and nursing note reviewed.  Constitutional:      Appearance: Normal appearance. She is not ill-appearing.  HENT:     Head: Normocephalic and atraumatic.     Right Ear: Tympanic membrane, ear canal and external ear normal. There is no impacted cerumen.     Left Ear: Tympanic membrane, ear canal and external ear normal. There is no impacted cerumen.     Nose: Congestion and rhinorrhea present.     Comments: Nasal mucosa is mildly edematous with clear rhinorrhea in both nares.    Mouth/Throat:     Mouth: Mucous membranes are moist.     Pharynx: Oropharynx is clear. Posterior oropharyngeal erythema present. No oropharyngeal exudate.     Comments: Bilateral tonsillar pillars are erythematous but free of exudate.  Posterior oropharynx also demonstrates mild erythema with clear postnasal drip. Neck:     Comments: Bilateral, tender, anterior cervical lymphadenopathy present on exam. Cardiovascular:     Rate and Rhythm: Normal rate and regular rhythm.     Pulses:  Normal pulses.     Heart sounds: Normal heart sounds. No murmur heard.    No friction rub. No gallop.  Pulmonary:     Effort: Pulmonary effort is normal.     Breath sounds: Normal breath sounds. No wheezing, rhonchi or rales.  Musculoskeletal:     Cervical back: Normal range of motion and neck supple. Tenderness present.  Lymphadenopathy:     Cervical: Cervical adenopathy present.  Skin:    General: Skin is warm and dry.     Capillary Refill: Capillary refill takes less than 2 seconds.     Findings: No rash.  Neurological:     General: No focal deficit present.     Mental Status: She is alert and oriented to person, place, and time.      UC Treatments / Results  Labs (all labs ordered are listed, but only abnormal results are displayed) Labs Reviewed  GROUP A STREP BY PCR  SARS CORONAVIRUS 2 BY RT PCR    EKG   Radiology No results found.  Procedures Procedures (including critical care time)  Medications Ordered in UC Medications - No data to display  Initial Impression / Assessment and Plan / UC Course  I have reviewed the triage  vital signs and the nursing notes.  Pertinent labs & imaging results that were available during my care of the patient were reviewed by me and considered in my medical decision making (see chart for details).   Patient is a pleasant, nontoxic-appearing 52 year old female presenting for evaluation of 2 days worth of URI symptoms as outlined HPI above.  On exam she does have inflamed nasal mucosa with clear rhinorrhea and erythema and edema to bilateral tonsillar pillars but no appreciable exudate.  Both of her tympanic membrane's are pearly gray in appearance with normal light reflex and both EACs are clear.  Her exam is consistent with an upper respiratory infection.  Differential diagnosis include COVID, influenza, and strep.  Given that she is on day 2 of symptoms and afebrile I am less concerned about influenza at this point I will order a  COVID and strep PCR.  Strep PCR is negative.  COVID PCR is negative.  I will discharge patient home with a diagnosis of viral URI.  I will prescribe Atrovent nasal spray to open nasal congestion.  Tylenol and/or ibuprofen as needed for pain.  Chloraseptic or Sucrets lozenges along with salt water gargles to help soothe the throat.  Return precautions reviewed.   Final Clinical Impressions(s) / UC Diagnoses   Final diagnoses:  Viral upper respiratory tract infection     Discharge Instructions      Your testing today for strep and COVID were both negative.  I do believe you have a viral respiratory infection causing your symptoms.  Please use over-the-counter Tylenol and/or ibuprofen according to the package instructions as needed for any fever or pain.  Use the Atrovent nasal spray, 2 squirts up each nostril every 6 hours, as needed for nasal congestion, runny nose, or postnasal drip.  You may use over-the-counter Chloraseptic or Sucrets lozenges, no more than 1 lozenge every 2 hours as the menthol may give you diarrhea, to help soothe your throat.  Gargle with salt water can also be helpful.  Mix 1 tablespoon of table salt in 8 ounces of warm water, gargle and spit.  If you develop any new or worsening symptoms please return for reevaluation or see your primary care provider.     ED Prescriptions     Medication Sig Dispense Auth. Provider   ipratropium (ATROVENT) 0.06 % nasal spray Place 2 sprays into both nostrils 4 (four) times daily. 15 mL Becky Augusta, NP      PDMP not reviewed this encounter.   Becky Augusta, NP 05/15/23 1840

## 2023-05-16 ENCOUNTER — Encounter: Payer: Self-pay | Admitting: Family Medicine

## 2023-06-05 ENCOUNTER — Ambulatory Visit: Payer: BC Managed Care – PPO | Admitting: Family Medicine

## 2023-07-10 ENCOUNTER — Ambulatory Visit: Payer: Self-pay | Admitting: Family Medicine

## 2023-07-14 ENCOUNTER — Other Ambulatory Visit: Payer: Self-pay | Admitting: Family Medicine

## 2023-07-14 DIAGNOSIS — R0602 Shortness of breath: Secondary | ICD-10-CM

## 2023-07-15 NOTE — Telephone Encounter (Signed)
 Requested Prescriptions  Pending Prescriptions Disp Refills   albuterol (VENTOLIN HFA) 108 (90 Base) MCG/ACT inhaler [Pharmacy Med Name: ALBUTEROL HFA 90 MCG INHALER] 6.7 each 2    Sig: INHALE 1-2 PUFFS BY MOUTH EVERY 6 HOURS AS NEEDED FOR WHEEZE OR SHORTNESS OF BREATH     Pulmonology:  B[facility] Agonists 2 Passed - 07/15/2023  2:58 PM      Passed - Last BP in normal range    BP Readings from Last 1 Encounters:  05/15/23 116/73         Passed - Last Heart Rate in normal range    Pulse Readings from Last 1 Encounters:  05/15/23 (!) 54         Passed - Valid encounter within last 12 months    Recent Outpatient Visits           3 months ago Recurrent major depressive disorder, in partial remission (HCC)   [facility], Monico Blitz, DO   10 months ago Intermittent right upper quadrant abdominal pain   [facility]dec[facility] Maes, Melton Alar, [place]   1 year ago Annual physical exam   [facility] at [facility], Nyoka Cowden, MD   2 years ago Encounter for screening mammogram for breast cancer   [facility], Nyoka Cowden, MD   3 years ago Annual physical exam   [facility] at [facility], Nyoka Cowden, MD       Future Appointments             In 2 weeks P[facility], Monico Blitz, DO [facility]

## 2023-08-02 ENCOUNTER — Ambulatory Visit: Payer: BC Managed Care – PPO | Admitting: Family Medicine

## 2023-08-02 ENCOUNTER — Encounter: Payer: Self-pay | Admitting: Family Medicine

## 2023-08-02 VITALS — BP 103/59 | HR 71 | Ht 65.0 in | Wt 204.0 lb

## 2023-08-02 DIAGNOSIS — F411 Generalized anxiety disorder: Secondary | ICD-10-CM

## 2023-08-02 DIAGNOSIS — R600 Localized edema: Secondary | ICD-10-CM | POA: Diagnosis not present

## 2023-08-02 DIAGNOSIS — F3341 Major depressive disorder, recurrent, in partial remission: Secondary | ICD-10-CM

## 2023-08-02 DIAGNOSIS — R29818 Other symptoms and signs involving the nervous system: Secondary | ICD-10-CM | POA: Insufficient documentation

## 2023-08-02 DIAGNOSIS — R42 Dizziness and giddiness: Secondary | ICD-10-CM | POA: Diagnosis not present

## 2023-08-02 DIAGNOSIS — Z23 Encounter for immunization: Secondary | ICD-10-CM

## 2023-08-02 MED ORDER — HYDROCHLOROTHIAZIDE 12.5 MG PO CAPS: 12.5000 mg | ORAL_CAPSULE | Freq: Every day | ORAL | 0 refills | Status: DC | PRN

## 2023-08-02 MED ORDER — MECLIZINE HCL 25 MG PO TABS: 25.0000 mg | ORAL_TABLET | Freq: Three times a day (TID) | ORAL | 0 refills | Status: AC | PRN

## 2023-08-02 MED ORDER — BUPROPION HCL ER (XL) 150 MG PO TB24: 150.0000 mg | ORAL_TABLET | Freq: Every day | ORAL | 1 refills | Status: DC

## 2023-08-02 NOTE — Progress Notes (Signed)
 Established patient visit   Patient: Meghan Weeks   DOB: [birth year]   53 y.o. Female  MRN: [id number] Visit Date: 08/02/2023  Today's healthcare provider: Sherlyn Hay, DO   No chief complaint on file.  Subjective    HPI The patient presents with foot swelling and dizziness.  She experiences daily swelling in one foot. She previously managed this with hydrochlorothiazide taken as needed but is uncertain about continuing the regimen of three days on and off.  She has a history of supraventricular tachycardia (SVT) with heart rates reaching up to 168 bpm, though no constant arrhythmia or atrial fibrillation has been noted. She experiences occasional palpitations and dizziness, described as a sensation of the world spinning. Her dizziness has worsened since having COVID-19, occurring more frequently, especially as a passenger in a car. She has not had recent head imaging but recalls a CT scan years ago after a blackout episode, which showed no abnormalities.  She has a history of anxiety and depression, previously managed with Wellbutrin and Zoloft. Zoloft caused headaches, and the immediate-release form of Wellbutrin caused jitteriness and migraines, while the extended-release form was more tolerable. She experiences anxiety, motivation issues, difficulty leaving the house, irritability, especially around her grandchildren, and difficulty focusing.  She has declined vaccines in the past, including the flu and shingles vaccines, due to skepticism about vaccines that are not 'tried and true'. She is open to receiving a tetanus vaccine as she does not recall her last one.     Medications: Outpatient Medications Prior to Visit  Medication Sig   Acetaminophen (TYLENOL PO) Take 500 mg by mouth.   albuterol (VENTOLIN HFA) 108 (90 Base) MCG/ACT inhaler INHALE 1-2 PUFFS BY MOUTH EVERY 6 HOURS AS NEEDED FOR WHEEZE OR SHORTNESS OF BREATH   Ibuprofen 200 MG CAPS Take by mouth 3 (three)  times daily.   ipratropium (ATROVENT) 0.06 % nasal spray Place 2 sprays into both nostrils 4 (four) times daily.   Multiple Vitamins-Minerals (BARIATRIC MULTIVITAMINS/IRON PO) Take 1 tablet by mouth 2 (two) times daily.   NON FORMULARY CPAP at 10 cm H2O   valACYclovir (VALTREX) 1000 MG tablet Take 1 tablet (1,000 mg total) by mouth 2 (two) times daily.   [DISCONTINUED] hydrochlorothiazide (MICROZIDE) 12.5 MG capsule TAKE 1 CAPSULE (12.5 MG TOTAL) BY MOUTH DAILY AS NEEDED.   promethazine (PHENERGAN) 12.5 MG tablet Take 12.5 mg by mouth every 6 (six) hours as needed for nausea or vomiting. (Patient not taking: Reported on 08/02/2023)   No facility-administered medications prior to visit.        Objective    BP (!) 103/59   Pulse 71   Ht 5\' 5"  (1.651 m)   Wt 204 lb (92.5 kg)   BMI 33.95 kg/m     Physical Exam Vitals and nursing note reviewed.  Constitutional:      General: She is not in acute distress.    Appearance: Normal appearance.  HENT:     Head: Normocephalic and atraumatic.  Eyes:     General: No scleral icterus.    Conjunctiva/sclera: Conjunctivae normal.  Cardiovascular:     Rate and Rhythm: Normal rate.  Pulmonary:     Effort: Pulmonary effort is normal.  Neurological:     Mental Status: She is alert and oriented to person, place, and time. Mental status is at baseline.     Coordination: Romberg sign positive.  Psychiatric:        Mood and Affect: Mood  normal.        Behavior: Behavior normal.      No results found for any visits on 08/02/23.  Assessment & Plan    Recurrent major depressive disorder, in partial remission (HCC) Assessment & Plan: Anxiety and depression managed with Wellbutrin (bupropion) and Zoloft (sertraline). Zoloft caused headaches, and immediate-release Wellbutrin caused symptoms similar to caffeine overdose. Prefers extended-release bupropion due to better tolerance. Symptoms include anxiety, lack of motivation, irritability, and  difficulty focusing. Considered venlafaxine but noted potential for increased anxiety with SNRIs. Decision to start with bupropion due to its once-daily dosing and better side effect profile for her symptoms. - Prescribe extended-release bupropion - Monitor response to bupropion and adjust treatment as needed   Orders: -     buPROPion HCl ER (XL); Take 1 tablet (150 mg total) by mouth daily.  Dispense: 30 tablet; Refill: 1  Localized edema Assessment & Plan: Chronic swelling in one foot, occurring daily. Previously managed with hydrochlorothiazide as needed. Considering resuming diuretic therapy to manage symptoms. - Resume hydrochlorothiazide as needed for swelling management  Orders: -     hydroCHLOROthiazide; Take 1 capsule (12.5 mg total) by mouth daily as needed.  Dispense: 90 capsule; Refill: 0  Generalized anxiety disorder Assessment & Plan: Addressed as noted above.  Orders: -     buPROPion HCl ER (XL); Take 1 tablet (150 mg total) by mouth daily.  Dispense: 30 tablet; Refill: 1  Dizziness Assessment & Plan: Holter monitor showed episodes of SVT with heart rate up to 168 bpm, no sustained arrhythmia or atrial fibrillation. Heart rate dropped to 44 bpm during sleep. Occasional palpitations and dizziness reported. No immediate intervention required as episodes are not prolonged. Absence of atrial fibrillation is reassuring and reduces the need for immediate treatment. - Monitor symptoms and consider further evaluation if symptoms persist or worsen  Orders: -     Meclizine HCl; Take 1 tablet (25 mg total) by mouth 3 (three) times daily as needed for dizziness.  Dispense: 60 tablet; Refill: 0  Romberg test positive Assessment & Plan: Chronic dizziness and motion sickness, exacerbated post-COVID. Symptoms include dizziness when in motion, especially as a passenger in a car, and difficulty with visual stimuli. No nystagmus observed during physical exam. Consideration of imaging or  neurology referral if symptoms persist. Discussed potential use of meclizine for symptomatic relief. Patient preferred to try medication prior to considering neurology referral.  - Prescribe meclizine for dizziness, to be taken three times a day initially, then reduce as symptoms improve - Consider imaging or neurology referral if symptoms persist   Need for Tdap vaccination -     Tdap vaccine greater than or equal to 7yo IM   Return in about 4 weeks (around 08/30/2023) for Anx/Dep.      I discussed the assessment and treatment plan with the patient  The patient was provided an opportunity to ask questions and all were answered. The patient agreed with the plan and demonstrated an understanding of the instructions.   The patient was advised to call back or seek an in-person evaluation if the symptoms worsen or if the condition fails to improve as anticipated.    Sherlyn Hay, DO  [facility] [phone] (phone) [phone] (fax)  [facility]

## 2023-08-11 ENCOUNTER — Encounter: Payer: Self-pay | Admitting: Family Medicine

## 2023-08-11 DIAGNOSIS — R600 Localized edema: Secondary | ICD-10-CM | POA: Insufficient documentation

## 2023-08-11 NOTE — Assessment & Plan Note (Addendum)
 Chronic dizziness and motion sickness, exacerbated post-COVID. Symptoms include dizziness when in motion, especially as a passenger in a car, and difficulty with visual stimuli. No nystagmus observed during physical exam. Consideration of imaging or neurology referral if symptoms persist. Discussed potential use of meclizine for symptomatic relief. Patient preferred to try medication prior to considering neurology referral.  - Prescribe meclizine for dizziness, to be taken three times a day initially, then reduce as symptoms improve - Consider imaging or neurology referral if symptoms persist

## 2023-08-11 NOTE — Assessment & Plan Note (Signed)
 Anxiety and depression managed with Wellbutrin (bupropion) and Zoloft (sertraline). Zoloft caused headaches, and immediate-release Wellbutrin caused symptoms similar to caffeine overdose. Prefers extended-release bupropion due to better tolerance. Symptoms include anxiety, lack of motivation, irritability, and difficulty focusing. Considered venlafaxine but noted potential for increased anxiety with SNRIs. Decision to start with bupropion due to its once-daily dosing and better side effect profile for her symptoms. - Prescribe extended-release bupropion - Monitor response to bupropion and adjust treatment as needed

## 2023-08-11 NOTE — Assessment & Plan Note (Signed)
 Addressed as noted above.

## 2023-08-11 NOTE — Assessment & Plan Note (Signed)
 Holter monitor showed episodes of SVT with heart rate up to 168 bpm, no sustained arrhythmia or atrial fibrillation. Heart rate dropped to 44 bpm during sleep. Occasional palpitations and dizziness reported. No immediate intervention required as episodes are not prolonged. Absence of atrial fibrillation is reassuring and reduces the need for immediate treatment. - Monitor symptoms and consider further evaluation if symptoms persist or worsen

## 2023-08-11 NOTE — Assessment & Plan Note (Signed)
 Chronic swelling in one foot, occurring daily. Previously managed with hydrochlorothiazide as needed. Considering resuming diuretic therapy to manage symptoms. - Resume hydrochlorothiazide as needed for swelling management

## 2023-08-21 DIAGNOSIS — M85852 Other specified disorders of bone density and structure, left thigh: Secondary | ICD-10-CM | POA: Diagnosis not present

## 2023-08-21 DIAGNOSIS — M810 Age-related osteoporosis without current pathological fracture: Secondary | ICD-10-CM | POA: Diagnosis not present

## 2023-08-21 DIAGNOSIS — M8588 Other specified disorders of bone density and structure, other site: Secondary | ICD-10-CM | POA: Diagnosis not present

## 2023-08-25 ENCOUNTER — Other Ambulatory Visit: Payer: Self-pay | Admitting: Family Medicine

## 2023-08-25 DIAGNOSIS — F3341 Major depressive disorder, recurrent, in partial remission: Secondary | ICD-10-CM

## 2023-08-25 DIAGNOSIS — F411 Generalized anxiety disorder: Secondary | ICD-10-CM

## 2023-09-04 ENCOUNTER — Encounter: Payer: Self-pay | Admitting: Family Medicine

## 2023-09-04 ENCOUNTER — Ambulatory Visit: Admitting: Family Medicine

## 2023-09-04 VITALS — BP 107/70 | HR 72 | Resp 16 | Ht 65.0 in | Wt 205.0 lb

## 2023-09-04 DIAGNOSIS — N182 Chronic kidney disease, stage 2 (mild): Secondary | ICD-10-CM

## 2023-09-04 DIAGNOSIS — Z1231 Encounter for screening mammogram for malignant neoplasm of breast: Secondary | ICD-10-CM

## 2023-09-04 DIAGNOSIS — R5382 Chronic fatigue, unspecified: Secondary | ICD-10-CM

## 2023-09-04 DIAGNOSIS — Z9884 Bariatric surgery status: Secondary | ICD-10-CM

## 2023-09-04 DIAGNOSIS — Z13220 Encounter for screening for lipoid disorders: Secondary | ICD-10-CM

## 2023-09-04 DIAGNOSIS — Z09 Encounter for follow-up examination after completed treatment for conditions other than malignant neoplasm: Secondary | ICD-10-CM | POA: Insufficient documentation

## 2023-09-04 DIAGNOSIS — R002 Palpitations: Secondary | ICD-10-CM

## 2023-09-04 DIAGNOSIS — S0592XA Unspecified injury of left eye and orbit, initial encounter: Secondary | ICD-10-CM

## 2023-09-04 DIAGNOSIS — R42 Dizziness and giddiness: Secondary | ICD-10-CM | POA: Diagnosis not present

## 2023-09-04 DIAGNOSIS — Z130 Encounter for screening for diseases of the blood and blood-forming organs and certain disorders involving the immune mechanism: Secondary | ICD-10-CM

## 2023-09-04 DIAGNOSIS — R29818 Other symptoms and signs involving the nervous system: Secondary | ICD-10-CM

## 2023-09-04 DIAGNOSIS — E66811 Obesity, class 1: Secondary | ICD-10-CM

## 2023-09-04 DIAGNOSIS — Z13228 Encounter for screening for other metabolic disorders: Secondary | ICD-10-CM

## 2023-09-04 NOTE — Patient Instructions (Signed)
 Take calcium 1200 mg daily and vitamin D 2000 units.

## 2023-09-04 NOTE — Progress Notes (Signed)
 Established patient visit   Patient: Meghan Weeks   DOB: March 08, 1971   53 y.o. Female  MRN: [id number] Visit Date: 09/04/2023  Today's healthcare provider: Carlean Charter, DO   Chief Complaint  Patient presents with   Follow-up    On labs   Subjective    HPI  Meghan Weeks is a 53 year old female who presents with for follow-up and dizziness.  She is experiencing some cognitive difficulties, particularly during conversations, describing her mind as not processing information properly and struggling to recall simple words. She is currently taking bupropion  and meclizine , which causes significant tiredness, exacerbating her existing fatigue.  Question exists regarding whether these are contributing to her difficulty during conversations.  She continues to experience dizziness, especially when climbing a small three-step ladder at work, feeling 'whoa' during the climb but fine once back on the ground. She has not had a head scan in years, with the last one done when she experienced blackouts prior to her hysterectomy. An ENT evaluation previously ruled out inner ear issues.  She reports a significant episode of sleepiness this past Saturday, feeling overwhelmingly sleepy after doing laundry and sleeping most of the day and night. She has a history of sleep apnea and uses a CPAP machine regularly, though not during  her unexpected Saturday nap.  She mentions experiencing palpitations, which have been particularly bad over the past month. She is unsure about any history of anemia and does not recall any recent blood work. No numbness, tingling, headaches, or changes in bowel or bladder habits.  She recently injured her eye with a plastic bag, causing pain and a sensation of a scratch in the middle of her eye, though there is no change in vision. She has not rinsed the eye and is considering seeing an eye doctor to ensure there is no cut or infection.  She has a family history of  osteoporosis on her mother's side and has been diagnosed with osteoporosis herself. She does not currently take calcium or vitamin D  supplements but has taken them in the past.      Medications: Outpatient Medications Prior to Visit  Medication Sig   Acetaminophen  (TYLENOL  PO) Take 500 mg by mouth.   albuterol  (VENTOLIN  HFA) 108 (90 Base) MCG/ACT inhaler INHALE 1-2 PUFFS BY MOUTH EVERY 6 HOURS AS NEEDED FOR WHEEZE OR SHORTNESS OF BREATH   buPROPion  (WELLBUTRIN  XL) 150 MG 24 hr tablet Take 1 tablet (150 mg total) by mouth daily.   hydrochlorothiazide  (MICROZIDE ) 12.5 MG capsule Take 1 capsule (12.5 mg total) by mouth daily as needed.   Ibuprofen  200 MG CAPS Take by mouth 3 (three) times daily.   ipratropium (ATROVENT ) 0.06 % nasal spray Place 2 sprays into both nostrils 4 (four) times daily.   meclizine  (ANTIVERT ) 25 MG tablet Take 1 tablet (25 mg total) by mouth 3 (three) times daily as needed for dizziness.   Multiple Vitamins-Minerals (BARIATRIC MULTIVITAMINS/IRON PO) Take 1 tablet by mouth 2 (two) times daily.   NON FORMULARY CPAP at 10 cm H2O   promethazine  (PHENERGAN ) 12.5 MG tablet Take 12.5 mg by mouth every 6 (six) hours as needed for nausea or vomiting.   valACYclovir  (VALTREX ) 1000 MG tablet Take 1 tablet (1,000 mg total) by mouth 2 (two) times daily.   No facility-administered medications prior to visit.        Objective    BP 107/70 (BP Location: Right Arm, Patient Position: Sitting, Cuff Size: Normal)  Pulse 72   Resp 16   Ht 5\' 5"  (1.651 m)   SpO2 99%   BMI 33.95 kg/m     Physical Exam Vitals and nursing note reviewed.  Constitutional:      General: She is not in acute distress.    Appearance: Normal appearance.  HENT:     Head: Normocephalic and atraumatic.  Eyes:     General: No scleral icterus.    Conjunctiva/sclera: Conjunctivae normal.  Cardiovascular:     Rate and Rhythm: Normal rate.  Pulmonary:     Effort: Pulmonary effort is normal.   Neurological:     Mental Status: She is alert and oriented to person, place, and time. Mental status is at baseline.  Psychiatric:        Mood and Affect: Mood normal.        Behavior: Behavior normal.     No results found for any visits on 09/04/23.  Assessment & Plan    Dizziness -     VITAMIN D  25 Hydroxy (Vit-D Deficiency, Fractures) -     TSH Rfx on Abnormal to Free T4 -     Vitamin B12 -     Cortisol -     Ambulatory referral to Neurology -     Iron, TIBC and Ferritin Panel  Follow-up exam, less than 3 months since previous exam  Palpitations -     TSH Rfx on Abnormal to Free T4 -     Cortisol -     Iron, TIBC and Ferritin Panel  Chronic fatigue -     Cortisol -     Iron, TIBC and Ferritin Panel  Obesity (BMI 30.0-34.9) -     Hemoglobin A1c  Bariatric surgery status -     VITAMIN D  25 Hydroxy (Vit-D Deficiency, Fractures) -     Vitamin B12 -     Zinc  -     Vitamin B1 -     Vitamin E -     Vitamin A  -     Vitamin K1 , Serum -     Iron, TIBC and Ferritin Panel  Romberg test positive -     Ambulatory referral to Neurology  Chronic kidney disease, stage 2, mildly decreased GFR  Screening for lipid disorders -     Lipid panel  Encounter for screening mammogram for breast cancer -     3D Screening Mammogram, Left and Right; Future  Screening for deficiency anemia -     CBC  Screening for endocrine, metabolic and immunity disorder -     Comprehensive metabolic panel with GFR -     Hemoglobin A1c  Left eye injury, initial encounter     Dizziness; positive Romberg Dizziness with history of blacking out. Inner ear issues, vitamin deficiencies, or blood sugar levels considered. Previous ENT evaluation unremarkable.  Romberg Positive at previous visit. - Order blood work for vitamin levels and blood sugar. - Order neurology referral, given persistent symptoms.  Chronic fatigue; cognitive Impairment Difficulty processing conversations and recalling  words, possibly due to medication or vitamin deficiencies. - Check vitamin levels: B1, B12, D, A, E, K. - Continue bupropion  at current dose. - Consider alternative medications if symptoms persist.  Palpitations Palpitations reported in the past month. No anemia or other factors identified.  Had long-term monitor performed recently which was not acutely concerning. - Consider further evaluation if palpitations persist.  Osteoporosis Diagnosed osteoporosis with T-score of -2.6. Family history present. Assessing calcium and vitamin D ; if  normal, plan to treat with calcium and vitamin D  at standard supplementation rate.  If abnormal, address abnormalities. - Check calcium and vitamin D  levels. - Recommend calcium and vitamin D  supplementation. - Encourage weight-bearing exercises and squats. - Recheck DEXA scan in 2 years  Left eye Injury Suspected corneal abrasion from accidental scratch. Pain reported, no vision change. - Refer to eye doctor for evaluation and fluorescein staining.  General Health Maintenance Due for mammogram and comprehensive blood work. Discussed fasting requirements for tests. - Order mammogram. - Order comprehensive blood work: thyroid  function, metabolic panel, cholesterol.   Follow-up Advised follow-up in three months to reassess condition and effectiveness of changes. - Schedule follow-up in three months. - Advise to contact office if symptoms worsen or bupropion  is ineffective.  Return in about 3 months (around 12/04/2023) for Anx/Dep, chronic.      I discussed the assessment and treatment plan with the patient  The patient was provided an opportunity to ask questions and all were answered. The patient agreed with the plan and demonstrated an understanding of the instructions.   The patient was advised to call back or seek an in-person evaluation if the symptoms worsen or if the condition fails to improve as anticipated.    Carlean Charter, DO  [facility]  [facility] [phone] (phone) [phone] (fax)  [facility]

## 2023-09-06 DIAGNOSIS — Z1329 Encounter for screening for other suspected endocrine disorder: Secondary | ICD-10-CM | POA: Diagnosis not present

## 2023-09-06 DIAGNOSIS — Z13220 Encounter for screening for lipoid disorders: Secondary | ICD-10-CM | POA: Diagnosis not present

## 2023-09-06 DIAGNOSIS — R5382 Chronic fatigue, unspecified: Secondary | ICD-10-CM | POA: Diagnosis not present

## 2023-09-06 DIAGNOSIS — R002 Palpitations: Secondary | ICD-10-CM | POA: Diagnosis not present

## 2023-09-06 DIAGNOSIS — Z13228 Encounter for screening for other metabolic disorders: Secondary | ICD-10-CM | POA: Diagnosis not present

## 2023-09-06 DIAGNOSIS — Z130 Encounter for screening for diseases of the blood and blood-forming organs and certain disorders involving the immune mechanism: Secondary | ICD-10-CM | POA: Diagnosis not present

## 2023-09-06 DIAGNOSIS — E66811 Obesity, class 1: Secondary | ICD-10-CM | POA: Diagnosis not present

## 2023-09-06 DIAGNOSIS — R42 Dizziness and giddiness: Secondary | ICD-10-CM | POA: Diagnosis not present

## 2023-09-13 ENCOUNTER — Encounter: Payer: Self-pay | Admitting: Family Medicine

## 2023-09-17 ENCOUNTER — Ambulatory Visit
Admission: RE | Admit: 2023-09-17 | Discharge: 2023-09-17 | Disposition: A | Discharge status: Home / Self Care | Source: Ambulatory Visit | Attending: Family Medicine | Admitting: Family Medicine

## 2023-09-17 DIAGNOSIS — Z1231 Encounter for screening mammogram for malignant neoplasm of breast: Secondary | ICD-10-CM | POA: Insufficient documentation

## 2023-09-19 LAB — LIPID PANEL
Chol/HDL Ratio: 3.2 ratio (ref 0.0–4.4)
Cholesterol, Total: 174 mg/dL (ref 100–199)
HDL: 55 mg/dL (ref >39)
LDL Chol Calc (NIH): 106 mg/dL — ABNORMAL HIGH (ref 0–99)
Triglycerides: 68 mg/dL (ref 0–149)
VLDL Cholesterol Cal: 13 mg/dL (ref 5–40)

## 2023-09-19 LAB — VITAMIN E
Vitamin E (Alpha Tocopherol): 8.3 mg/L (ref 7.0–25.1)
Vitamin E(Gamma Tocopherol): 0.7 mg/L (ref 0.5–5.5)

## 2023-09-19 LAB — COMPREHENSIVE METABOLIC PANEL WITH GFR
ALT: 26 IU/L (ref 0–32)
AST: 23 IU/L (ref 0–40)
Albumin: 4.4 g/dL (ref 3.8–4.9)
Alkaline Phosphatase: 134 IU/L — ABNORMAL HIGH (ref 44–121)
BUN/Creatinine Ratio: 11 (ref 9–23)
BUN: 11 mg/dL (ref 6–24)
Bilirubin Total: 0.4 mg/dL (ref 0.0–1.2)
CO2: 23 mmol/L (ref 20–29)
Calcium: 9.1 mg/dL (ref 8.7–10.2)
Chloride: 103 mmol/L (ref 96–106)
Creatinine, Ser: 0.98 mg/dL (ref 0.57–1.00)
Globulin, Total: 2.5 g/dL (ref 1.5–4.5)
Glucose: 84 mg/dL (ref 70–99)
Potassium: 4.7 mmol/L (ref 3.5–5.2)
Sodium: 142 mmol/L (ref 134–144)
Total Protein: 6.9 g/dL (ref 6.0–8.5)
eGFR: 69 mL/min/{1.73_m2} (ref >59)

## 2023-09-19 LAB — CBC
Hematocrit: 39.7 % (ref 34.0–46.6)
Hemoglobin: 13.2 g/dL (ref 11.1–15.9)
MCH: 30.3 pg (ref 26.6–33.0)
MCHC: 33.2 g/dL (ref 31.5–35.7)
MCV: 91 fL (ref 79–97)
Platelets: 332 10*3/uL (ref 150–450)
RBC: 4.35 x10E6/uL (ref 3.77–5.28)
RDW: 12 % (ref 11.7–15.4)
WBC: 4.4 10*3/uL (ref 3.4–10.8)

## 2023-09-19 LAB — VITAMIN A: Vitamin A: 45.6 ug/dL (ref 20.1–62.0)

## 2023-09-19 LAB — ZINC: Zinc: 66 ug/dL (ref 44–115)

## 2023-09-19 LAB — VITAMIN B12: Vitamin B-12: 577 pg/mL (ref 232–1245)

## 2023-09-19 LAB — TSH RFX ON ABNORMAL TO FREE T4: TSH: 1.33 u[IU]/mL (ref 0.450–4.500)

## 2023-09-19 LAB — HEMOGLOBIN A1C
Est. average glucose Bld gHb Est-mCnc: 120 mg/dL
Hgb A1c MFr Bld: 5.8 % — ABNORMAL HIGH (ref 4.8–5.6)

## 2023-09-19 LAB — VITAMIN B1: Thiamine: 104.4 nmol/L (ref 66.5–200.0)

## 2023-09-19 LAB — VITAMIN K1, SERUM: VITAMIN K1: 0.59 ng/mL (ref 0.10–2.20)

## 2023-09-19 LAB — CORTISOL: Cortisol: 7.4 ug/dL (ref 6.2–19.4)

## 2023-09-19 LAB — VITAMIN D 25 HYDROXY (VIT D DEFICIENCY, FRACTURES): Vit D, 25-Hydroxy: 23 ng/mL — ABNORMAL LOW (ref 30.0–100.0)

## 2023-09-30 ENCOUNTER — Encounter: Payer: Self-pay | Admitting: Family Medicine

## 2023-10-01 ENCOUNTER — Other Ambulatory Visit: Payer: Self-pay | Admitting: Family Medicine

## 2023-10-01 DIAGNOSIS — F3341 Major depressive disorder, recurrent, in partial remission: Secondary | ICD-10-CM

## 2023-10-01 DIAGNOSIS — F411 Generalized anxiety disorder: Secondary | ICD-10-CM

## 2023-10-02 ENCOUNTER — Ambulatory Visit (INDEPENDENT_AMBULATORY_CARE_PROVIDER_SITE_OTHER)

## 2023-10-02 ENCOUNTER — Ambulatory Visit
Admission: EM | Admit: 2023-10-02 | Discharge: 2023-10-02 | Disposition: A | Discharge status: Home / Self Care | Attending: Family Medicine | Admitting: Family Medicine

## 2023-10-02 DIAGNOSIS — R051 Acute cough: Secondary | ICD-10-CM | POA: Diagnosis not present

## 2023-10-02 DIAGNOSIS — J069 Acute upper respiratory infection, unspecified: Secondary | ICD-10-CM | POA: Diagnosis not present

## 2023-10-02 DIAGNOSIS — R059 Cough, unspecified: Secondary | ICD-10-CM | POA: Diagnosis not present

## 2023-10-02 MED ORDER — PROMETHAZINE-DM 6.25-15 MG/5ML PO SYRP: 5.0000 mL | ORAL_SOLUTION | Freq: Four times a day (QID) | ORAL | 0 refills | Status: DC | PRN

## 2023-10-02 NOTE — ED Triage Notes (Signed)
 Sx started Friday  Sore throat Productive cough with green mucus Fatigue

## 2023-10-02 NOTE — Discharge Instructions (Addendum)
 Your chest xray did not show a punemonia. Stop by the pharmacy to pick up your prescriptions.  Follow up with your primary care provider or return to the urgent care, if not improving.   You have a viral respiratory infection that will gradually improve over the next 7-10 days. Cough may last up to 3 weeks.    You can take Tylenol  and/or Ibuprofen  as needed for fever reduction and pain relief.    For cough: honey 1/2 to 1 teaspoon (you can dilute the honey in water or another fluid).  You can use a humidifier for chest congestion and cough.  If you don't have a humidifier, you can sit in the bathroom with the hot shower running.      For sore throat: try warm salt water gargles, Mucinex sore throat cough drops or cepacol lozenges, throat spray, warm tea or water with lemon/honey, popsicles or ice, or OTC cold relief medicine for throat discomfort. You can also purchase chloraseptic spray at the pharmacy or dollar store.   For congestion: take a daily anti-histamine like Zyrtec, Claritin, and a oral decongestant, such as pseudoephedrine.  You can also use Flonase 1-2 sprays in each nostril daily.    It is important to stay hydrated: drink plenty of fluids (water, gatorade/powerade/pedialyte, juices, or teas) to keep your throat moisturized and help further relieve irritation/discomfort.    Return or go to the Emergency Department if symptoms worsen or do not improve in the next few days

## 2023-10-02 NOTE — ED Provider Notes (Signed)
 [facility]    CSN: [id number] Arrival date & time: 10/02/23  1717      History   Chief Complaint Chief Complaint  Patient presents with   Cough   Sore Throat   Fatigue    HPI KATIANNE FENCL is a 53 y.o. female.   HPI  History obtained from the patient. Jaiel presents for burning sensation in the throat that started Friday. Cough is not getting better. Has thick green sputum.  No fever. Vomiting, diarrhea, rash, abdominal pain, Had headache and it resolved.  Has sinus pressure which helped. Took DayQuil and Coricidin HBP. Used her inhaler. Has worse cough at night where sh gets like she can't get her congestion out.  Her grandson was coughing for a couple days.    No history of asthma. No history of smoking or vaping.    Past Medical History:  Diagnosis Date   ADHD    Allergy    seasonal   Anxiety    Bronchitis    Car sickness    Cholelithiasis    Complication of anesthesia    Depression    Establishing care with new doctor, encounter for 04/29/2023   History of basal cell cancer    forehead and nose   Hypertension    Kidney atrophy    right kidney shrinking?   Neuroma of foot    PONV (postoperative nausea and vomiting)    Psoriasis    Renal stones 03/2017   right    Sleep apnea 2007 or 2008   CPAP    Patient Active Problem List   Diagnosis Date Noted   Follow-up exam, less than 3 months since previous exam 09/04/2023   Localized edema 08/11/2023   Generalized anxiety disorder 08/02/2023   Dizziness 08/02/2023   Romberg test positive 08/02/2023   Urinary frequency 04/29/2023   Stress incontinence in female 04/29/2023   Osteopenia of lumbar spine 04/29/2023   Palpitations 04/29/2023   Bradycardia 04/29/2023   Herpes 04/29/2023   Lymphedema 04/29/2023   Polyp of ascending colon    Family history of colonic polyps    Pain in joint of right hip 02/15/2021   S/P TAH-BSO 05/22/2020   Recurrent major depressive disorder, in partial  remission (HCC) 01/21/2019   Bariatric surgery status 12/16/2017   Sleep apnea    Psoriasis    Neuroma of foot    History of basal cell cancer    Chronic pain of right knee 05/18/2016   Rectocele 06/25/2011    Past Surgical History:  Procedure Laterality Date   ABDOMINAL HYSTERECTOMY     ANKLE SURGERY Left 05/04/2022   [facility]  [facility]   CHOLECYSTECTOMY     COLONOSCOPY     COLONOSCOPY WITH PROPOFOL  N/A 07/14/2021   Procedure: COLONOSCOPY WITH PROPOFOL ;  Surgeon: Selena Daily, MD;  Location: [facility];  Service: Gastroenterology;  Laterality: N/A;   CYSTOSCOPY/URETEROSCOPY/HOLMIUM LASER/STENT PLACEMENT Left 04/24/2019   Procedure: CYSTOSCOPY/LEFT URETEROSCOPY AND LEFT RETROGRADE PYELOGRAM/HOLMIUM LASER/STENT PLACEMENT;  Surgeon: Lawerence Pressman, MD;  Location: [facility] ORS;  Service: Urology;  Laterality: Left;   EXCISION MORTON'S NEUROMA Right    gastric sleeve surgery  2013   with repair of hiatal hernia   HERNIA REPAIR  2013   hiatal   PR LAPS ABD PRTM&OMENTUM DX W/WO SPEC BR/WA SPX  PR LAPS ABD PRTM&OMENTUM DX W/WO SPEC BR/WA SPX  DIAGNOSTIC LAPAROSCOPY   12/18/2022   REVISION OF GASTRIC  SLEEVE TO BILIOPANCREATIC DIVERSION WITH DUODENAL SWITCH LAPAROSCOPIC  03/13/2018   right elbow surgery     right wrist surgery     SMALL INTESTINE SURGERY  2019 or 2020   duodenal switch   TOTAL ABDOMINAL HYSTERECTOMY  2006   TUBAL LIGATION  1995    OB History   No obstetric history on file.      Home Medications    Prior to Admission medications   Medication Sig Start Date End Date Taking? Authorizing Provider  buPROPion  (WELLBUTRIN  XL) 150 MG 24 hr tablet Take 1 tablet (150 mg total) by mouth daily. 08/02/23  Yes Pardue, Asencion Blacksmith, DO  hydrochlorothiazide  (MICROZIDE ) 12.5 MG capsule Take 1 capsule (12.5 mg total) by mouth daily as needed. 08/02/23  Yes Pardue, Asencion Blacksmith, DO  promethazine -dextromethorphan (PROMETHAZINE -DM) 6.25-15  MG/5ML syrup Take 5 mLs by mouth 4 (four) times daily as needed. 10/02/23  Yes Alayla Dethlefs, DO  Acetaminophen  (TYLENOL  PO) Take 500 mg by mouth.    [provider]  albuterol  (VENTOLIN  HFA) 108 (90 Base) MCG/ACT inhaler INHALE 1-2 PUFFS BY MOUTH EVERY 6 HOURS AS NEEDED FOR WHEEZE OR SHORTNESS OF BREATH 07/15/23   Pardue, Asencion Blacksmith, DO  Ibuprofen  200 MG CAPS Take by mouth 3 (three) times daily.    [provider]  ipratropium (ATROVENT ) 0.06 % nasal spray Place 2 sprays into both nostrils 4 (four) times daily. 05/15/23   Kent Pear, NP  meclizine  (ANTIVERT ) 25 MG tablet Take 1 tablet (25 mg total) by mouth 3 (three) times daily as needed for dizziness. 08/02/23   Carlean Charter, DO  Multiple Vitamins-Minerals (BARIATRIC MULTIVITAMINS/IRON PO) Take 1 tablet by mouth 2 (two) times daily.    [provider]  NON FORMULARY CPAP at 10 cm H2O    [provider]  promethazine  (PHENERGAN ) 12.5 MG tablet Take 12.5 mg by mouth every 6 (six) hours as needed for nausea or vomiting. 08/02/23   [provider]  valACYclovir  (VALTREX ) 1000 MG tablet Take 1 tablet (1,000 mg total) by mouth 2 (two) times daily. 04/16/23   Carlean Charter, DO    Family History Family History  Problem Relation Age of Onset   Hematuria Mother    Cancer Mother    COPD Mother    Depression Mother    Drug abuse Mother    Varicose Veins Mother    Diabetes Father    Hypertension Father    Diabetes Sister    Hypertension Sister    Cancer Sister    Depression Sister    Obesity Sister    Varicose Veins Sister    Colon cancer Maternal Grandmother    Uterine cancer Maternal Grandmother    Cancer Maternal Grandmother    Stroke Paternal Grandmother    ADD / ADHD Daughter    Depression Daughter    ADD / ADHD Son    Depression Son    Drug abuse Son    Depression Brother    Drug abuse Brother    Depression Brother    Diabetes Brother    Drug abuse Brother    Depression Brother     Drug abuse Brother    Diabetes Sister    Obesity Sister    Drug abuse Brother    Kidney cancer Neg Hx    Bladder Cancer Neg Hx    Breast cancer Neg Hx     Social History Social History   Tobacco Use   Smoking status: Former  Current packs/day: 0.00    Average packs/day: 0.5 packs/day for 10.0 years (5.0 ttl pk-yrs)    Types: Cigarettes    Start date: 69    Quit date: 85    Years since quitting: 30.3   Smokeless tobacco: Never  Vaping Use   Vaping status: Never Used  Substance Use Topics   Alcohol use: Not Currently    Comment: social   Drug use: No     Allergies   Codeine and Levonorgestrel-ethinyl estrad   Review of Systems Review of Systems: negative unless otherwise stated in HPI.      Physical Exam Triage Vital Signs ED Triage Vitals  Encounter Vitals Group     BP      Systolic BP Percentile      Diastolic BP Percentile      Pulse      Resp      Temp      Temp src      SpO2      Weight      Height      Head Circumference      Peak Flow      Pain Score      Pain Loc      Pain Education      Exclude from Growth Chart    No data found.  Updated Vital Signs BP 117/78 (BP Location: Right Arm)   Pulse 62   Temp 98.3 F (36.8 C) (Oral)   Resp 17   SpO2 97%   Visual Acuity Right Eye Distance:   Left Eye Distance:   Bilateral Distance:    Right Eye Near:   Left Eye Near:    Bilateral Near:     Physical Exam GEN:     alert, non-toxic appearing female in no distress    HENT:  mucus membranes moist, oropharyngeal without lesions or erythema, no tonsillar hypertrophy or exudates,  clear nasal discharge, bilateral TM normal EYES:   pupils equal and reactive, no scleral injection or discharge NECK:  normal ROM, no lymphadenopathy, no meningismus   RESP:  no increased work of breathing, clear to auscultation bilaterally CVS:   regular rate and rhythm Skin:   warm and dry, no rash on visible skin    UC Treatments / Results   Labs (all labs ordered are listed, but only abnormal results are displayed) Labs Reviewed - No data to display  EKG   Radiology DG Chest 2 View Result Date: 10/02/2023 CLINICAL DATA:  Cough for 5 days. EXAM: CHEST - 2 VIEW COMPARISON:  April 15, 2021. FINDINGS: The heart size and mediastinal contours are within normal limits. Both lungs are clear. The visualized skeletal structures are unremarkable. IMPRESSION: No active cardiopulmonary disease. Electronically Signed   By: Rosalene Colon M.D.   On: 10/02/2023 17:54    Procedures Procedures (including critical care time)  Medications Ordered in UC Medications - No data to display  Initial Impression / Assessment and Plan / UC Course  I have reviewed the triage vital signs and the nursing notes.  Pertinent labs & imaging results that were available during my care of the patient were reviewed by me and considered in my medical decision making (see chart for details).       Pt is a 53 y.o. female who presents for 5 days of respiratory symptoms. Ahmara is afebrile here. Satting well on room air. Overall pt is non-toxic appearing, well hydrated, without respiratory distress. Pulmonary exam is unremarkable.  COVID and influenza panel deferred due to duration of symptoms.  Chest imaging obtained as pt concerned for possible pneumonia.Chest xray personally reviewed by me without focal pneumonia, pleural effusion, cardiomegaly or pneumothorax. Radiologist impression reviewed.    History most consistent with viral respiratory illness. Discussed symptomatic treatment.  Explained lack of efficacy of antibiotics in viral disease.  Typical duration of symptoms discussed.  Promethazine  DM for cough.  Return and ED precautions given and voiced understanding. Discussed MDM, treatment plan and plan for follow-up with patient who agrees with plan.     Final Clinical Impressions(s) / UC Diagnoses   Final diagnoses:  Acute cough  Viral URI  with cough     Discharge Instructions      Your chest xray did not show a punemonia. Stop by the pharmacy to pick up your prescriptions.  Follow up with your primary care provider or return to the urgent care, if not improving.   You have a viral respiratory infection that will gradually improve over the next 7-10 days. Cough may last up to 3 weeks.    You can take Tylenol  and/or Ibuprofen  as needed for fever reduction and pain relief.    For cough: honey 1/2 to 1 teaspoon (you can dilute the honey in water or another fluid).  You can use a humidifier for chest congestion and cough.  If you don't have a humidifier, you can sit in the bathroom with the hot shower running.      For sore throat: try warm salt water gargles, Mucinex sore throat cough drops or cepacol lozenges, throat spray, warm tea or water with lemon/honey, popsicles or ice, or OTC cold relief medicine for throat discomfort. You can also purchase chloraseptic spray at the pharmacy or dollar store.   For congestion: take a daily anti-histamine like Zyrtec, Claritin, and a oral decongestant, such as pseudoephedrine.  You can also use Flonase 1-2 sprays in each nostril daily.    It is important to stay hydrated: drink plenty of fluids (water, gatorade/powerade/pedialyte, juices, or teas) to keep your throat moisturized and help further relieve irritation/discomfort.    Return or go to the Emergency Department if symptoms worsen or do not improve in the next few days      ED Prescriptions     Medication Sig Dispense Auth. Provider   promethazine -dextromethorphan (PROMETHAZINE -DM) 6.25-15 MG/5ML syrup Take 5 mLs by mouth 4 (four) times daily as needed. 118 mL Anielle Headrick, DO      I have reviewed the PDMP during this encounter.   Kentavius Dettore, DO 10/02/23 1810

## 2023-10-24 ENCOUNTER — Other Ambulatory Visit: Payer: Self-pay | Admitting: Family Medicine

## 2023-10-24 DIAGNOSIS — R600 Localized edema: Secondary | ICD-10-CM

## 2023-10-25 ENCOUNTER — Other Ambulatory Visit: Payer: Self-pay | Admitting: Family Medicine

## 2023-10-25 DIAGNOSIS — R0602 Shortness of breath: Secondary | ICD-10-CM

## 2023-11-06 DIAGNOSIS — H8113 Benign paroxysmal vertigo, bilateral: Secondary | ICD-10-CM | POA: Diagnosis not present

## 2023-11-06 DIAGNOSIS — R278 Other lack of coordination: Secondary | ICD-10-CM | POA: Diagnosis not present

## 2023-11-06 DIAGNOSIS — T753XXA Motion sickness, initial encounter: Secondary | ICD-10-CM | POA: Diagnosis not present

## 2023-11-06 DIAGNOSIS — Z1331 Encounter for screening for depression: Secondary | ICD-10-CM | POA: Diagnosis not present

## 2023-12-04 ENCOUNTER — Encounter: Payer: Self-pay | Admitting: Family Medicine

## 2023-12-04 ENCOUNTER — Ambulatory Visit: Admitting: Family Medicine

## 2023-12-04 VITALS — BP 90/69 | HR 70 | Resp 14 | Ht 65.0 in | Wt 209.9 lb

## 2023-12-04 DIAGNOSIS — F3341 Major depressive disorder, recurrent, in partial remission: Secondary | ICD-10-CM

## 2023-12-04 DIAGNOSIS — R7309 Other abnormal glucose: Secondary | ICD-10-CM

## 2023-12-04 DIAGNOSIS — M810 Age-related osteoporosis without current pathological fracture: Secondary | ICD-10-CM | POA: Diagnosis not present

## 2023-12-04 DIAGNOSIS — H8110 Benign paroxysmal vertigo, unspecified ear: Secondary | ICD-10-CM | POA: Diagnosis not present

## 2023-12-04 DIAGNOSIS — F411 Generalized anxiety disorder: Secondary | ICD-10-CM

## 2023-12-04 MED ORDER — BUPROPION HCL ER (XL) 300 MG PO TB24: 300.0000 mg | ORAL_TABLET | Freq: Every day | ORAL | 0 refills | Status: DC

## 2023-12-04 MED ORDER — NORTRIPTYLINE HCL 10 MG PO CAPS: 20.0000 mg | ORAL_CAPSULE | Freq: Every day | ORAL | 0 refills | Status: AC

## 2023-12-04 NOTE — Progress Notes (Signed)
 Established patient visit   Patient: Meghan Weeks   DOB: 11/30/1970   53 y.o. Female  MRN: [id number] Visit Date: 12/04/2023  Today's healthcare provider: LAURAINE LOISE BUOY, DO   Chief Complaint  Patient presents with   Anxiety   Depression   Subjective    Anxiety    Depression        Past medical history includes anxiety.    Meghan Weeks is a 53 year old female with vertigo and anxiety who presents for medication management and follow-up.  She has been experiencing vertigo and was prescribed nortriptyline  by her neurologist. She was instructed to take 20 mg at night for one month, then decrease to 10 mg at night for another month before discontinuing. However, there was a misunderstanding with the pharmacy, and she received only 45 pills instead of the required 90, leading to her running out of medication. She started the medication on June 14th and ran out a full doses on July 8th, with 1 pill left over. The exercises prescribed for her vertigo cause dizziness and are difficult to perform, which, along with time constraints, has limited her adherence to them. She experiences occasional dizziness and lightheadedness, which she attributes to her vertigo.   She is taking bupropion  150 mg once daily for anxiety and depression. She started this medication in March and experiences persistent symptoms of low motivation and excessive sleep. She can sleep all day and night if she stays at home and has days where she does not want to do anything.  She has a history of osteoporosis, which has since then been managed with calcium and vitamin D  supplementation; patient has not been adherent to recommendation for exercise, outside of day-to-day requirements for work. She takes vitamin D  and calcium supplements but has not engaged in regular weight-bearing exercises due to foot pain, which affects her ability to squat and perform certain activities. Her family history includes  osteoporosis in her mother and aunt.  Her social history includes a habit of consuming sugary drinks, specifically from [employer], which may contribute to her prediabetes. She is exploring alternatives like protein shakes to reduce sugar intake.      Medications: Outpatient Medications Prior to Visit  Medication Sig   Acetaminophen  (TYLENOL  PO) Take 500 mg by mouth.   albuterol  (VENTOLIN  HFA) 108 (90 Base) MCG/ACT inhaler INHALE 1-2 PUFFS BY MOUTH EVERY 6 HOURS AS NEEDED FOR WHEEZE OR SHORTNESS OF BREATH   hydrochlorothiazide  (MICROZIDE ) 12.5 MG capsule TAKE 1 CAPSULE (12.5 MG TOTAL) BY MOUTH DAILY AS NEEDED.   Ibuprofen  200 MG CAPS Take by mouth 3 (three) times daily.   ipratropium (ATROVENT ) 0.06 % nasal spray Place 2 sprays into both nostrils 4 (four) times daily.   meclizine  (ANTIVERT ) 25 MG tablet Take 1 tablet (25 mg total) by mouth 3 (three) times daily as needed for dizziness.   Multiple Vitamins-Minerals (BARIATRIC MULTIVITAMINS/IRON PO) Take 1 tablet by mouth 2 (two) times daily.   NON FORMULARY CPAP at 10 cm H2O   promethazine  (PHENERGAN ) 12.5 MG tablet Take 12.5 mg by mouth every 6 (six) hours as needed for nausea or vomiting.   valACYclovir  (VALTREX ) 1000 MG tablet Take 1 tablet (1,000 mg total) by mouth 2 (two) times daily.   [DISCONTINUED] buPROPion  (WELLBUTRIN  XL) 150 MG 24 hr tablet TAKE 1 TABLET BY MOUTH EVERY DAY   [DISCONTINUED] nortriptyline  (PAMELOR ) 10 MG capsule Take 10 mg by mouth 2 (two) times daily.   [DISCONTINUED]  promethazine -dextromethorphan (PROMETHAZINE -DM) 6.25-15 MG/5ML syrup Take 5 mLs by mouth 4 (four) times daily as needed.   No facility-administered medications prior to visit.        Objective    BP 90/69 (BP Location: Left Arm, Patient Position: Sitting, Cuff Size: Large)   Pulse 70   Resp 14   Ht 5' 5 (1.651 m)   Wt 209 lb 14.4 oz (95.2 kg)   SpO2 97%   BMI 34.93 kg/m     Physical Exam Vitals and nursing note reviewed.   Constitutional:      General: She is not in acute distress.    Appearance: Normal appearance.  HENT:     Head: Normocephalic and atraumatic.  Eyes:     General: No scleral icterus.    Conjunctiva/sclera: Conjunctivae normal.  Cardiovascular:     Rate and Rhythm: Normal rate.  Pulmonary:     Effort: Pulmonary effort is normal.  Neurological:     Mental Status: She is alert and oriented to person, place, and time. Mental status is at baseline.  Psychiatric:        Mood and Affect: Mood normal.        Behavior: Behavior normal.      No results found for any visits on 12/04/23.  Assessment & Plan    Benign paroxysmal positional vertigo, unspecified laterality -     Nortriptyline  HCl; Take 2 capsules (20 mg total) by mouth at bedtime for 20 days.  Dispense: 39 capsule; Refill: 0  Recurrent major depressive disorder, in partial remission (HCC) -     buPROPion  HCl ER (XL); Take 1 tablet (300 mg total) by mouth daily.  Dispense: 90 tablet; Refill: 0  Generalized anxiety disorder -     buPROPion  HCl ER (XL); Take 1 tablet (300 mg total) by mouth daily.  Dispense: 90 tablet; Refill: 0  Osteoporosis of lumbar spine  Elevated hemoglobin A1c     Benign paroxysmal positional vertigo Nortriptyline  prescribed but ran out due to pharmacy error. Inconsistent exercise (Semont maneuver) adherence. Neurology follow-up scheduled in September. - Send prescription for nortriptyline  10 mg tablets to pharmacy.  Patient aware to take 2 tablets nightly through the 13th (she started the prescription on 11/09/2023), then transition to 1 tablet nightly. - Encourage adherence to prescribed exercises. - Follow up with neurology in September. - Consider imaging if no improvement.  Anxiety and Depression Persistent anxiety and depression symptoms. Currently on bupropion  XL 150 mg. Discussed increasing to 300 mg for better symptom control. - Increase bupropion  XL to 300 mg extended-release daily. -  Follow up in 6 weeks to assess response. - Monitor for side effects.  Osteoporosis Osteoporosis with T-score -2.6 close on lumbar spine. Family history present.  Currently supplementing low vitamin D . Inconsistent with weight-bearing exercises. Discussed medication options and risks, including osteonecrosis. - Continue calcium and vitamin D  supplementation. - Encourage weight-bearing exercises and squats. - Discuss benefits and risks of medications, including both once weekly and once monthly options. - Consider starting medication after review. - Recheck DEXA scan in 2 years  Elevated A1c Blood glucose in prediabetes range. High sugar intake from [employer] frappes noted. Interested in reducing sugar intake. - Encourage reduction of high sugar beverages. - Suggest protein drink alternatives.  General Health Maintenance Vitamin D  level at 23 ng/mL. Declined hepatitis B vaccine. - Continue vitamin D  supplementation. - Recheck vitamin D  levels in 6 months. - Encourage hepatitis B vaccination.    Return in about 6 weeks (around  01/15/2024) for Anx/Dep.      I discussed the assessment and treatment plan with the patient  The patient was provided an opportunity to ask questions and all were answered. The patient agreed with the plan and demonstrated an understanding of the instructions.   The patient was advised to call back or seek an in-person evaluation if the symptoms worsen or if the condition fails to improve as anticipated.    LAURAINE LOISE BUOY, DO  [facility] [phone] (phone) [phone] (fax)  [facility]

## 2024-01-15 ENCOUNTER — Ambulatory Visit: Admitting: Family Medicine

## 2024-01-24 ENCOUNTER — Other Ambulatory Visit: Payer: Self-pay | Admitting: Family Medicine

## 2024-01-24 DIAGNOSIS — R600 Localized edema: Secondary | ICD-10-CM

## 2024-02-24 ENCOUNTER — Ambulatory Visit: Payer: Self-pay

## 2024-02-24 ENCOUNTER — Ambulatory Visit: Admitting: Family Medicine

## 2024-02-24 ENCOUNTER — Encounter: Payer: Self-pay | Admitting: Family Medicine

## 2024-02-24 VITALS — BP 123/81 | HR 87 | Temp 98.1°F | Ht 65.0 in | Wt 200.9 lb

## 2024-02-24 DIAGNOSIS — R519 Headache, unspecified: Secondary | ICD-10-CM | POA: Diagnosis not present

## 2024-02-24 DIAGNOSIS — K068 Other specified disorders of gingiva and edentulous alveolar ridge: Secondary | ICD-10-CM

## 2024-02-24 NOTE — Telephone Encounter (Signed)
 Noted

## 2024-02-24 NOTE — Telephone Encounter (Signed)
   Copied from CRM #[id number]. Topic: Clinical - Red Word Triage >> Feb 24, 2024  9:10 AM Berwyn MATSU wrote: Red Word that prompted transfer to Nurse Triage: sinus and ear pain; sharp pains in head. Bruised feeling if she touches face.

## 2024-02-24 NOTE — Progress Notes (Signed)
 Acute visit   Patient: Meghan Weeks   DOB: [birth year]   53 y.o. Female  MRN: [id number] PCP: Donzella Lauraine SAILOR, DO   Chief Complaint  Patient presents with   Acute Visit    Sinus Pain // RN Triage completed today. Patient reported pressure behind eyes, face and head/head tender to touch. She reports sinus drainage for the last couple of weeks and she got a dental cleaning last Wednesday and her ice pick pain began on Friday. Reports moderate pain, she reports congestion but able to breath through both nostrils, drainage is clear, no fever to report and has right ear pain. She reports taking sudaffed today she has taken sudafed at 7 and 12:30   Subjective    Discussed the use of AI scribe software for clinical note transcription with the patient, who gave verbal consent to proceed.  History of Present Illness   Meghan Weeks is a 53 year old female who presents with sinus pain and facial tenderness following dental work.  She experiences sinus pain and facial tenderness, particularly after dental work last week. This is the second occurrence of gum soreness following a dental visit. By Friday, the pain intensified, with a sharp, ice pick-like pain radiating from her head to her neck and ear. Her scalp and throat feel bruised and sensitive to touch.  She denies vision changes, weakness, numbness, speech difficulties, facial drooping, and numbness. She feels pressure behind her eye, causing it to water, with persistent pressure throughout the day. Nasal discharge remains clear.  She uses peroxide mouthwash for gum sensitivity, which appears red. She questions the normalcy of prolonged gum sensitivity post-dental work, as it usually resolves within a few days. No prior tenderness or symptoms in the affected area before Friday.        Review of Systems  Objective    BP 123/81 (BP Location: Left Arm, Patient Position: Sitting, Cuff Size: Normal)   Pulse 87   Temp 98.1 F  (36.7 C) (Oral)   Ht 5' 5 (1.651 m)   Wt 200 lb 14.4 oz (91.1 kg)   SpO2 100%   BMI 33.43 kg/m  Physical Exam Vitals reviewed.  Constitutional:      General: She is not in acute distress.    Appearance: Normal appearance. She is well-developed. She is not diaphoretic.  HENT:     Head: Normocephalic and atraumatic.     Comments: TTP over R temple    Right Ear: Tympanic membrane, ear canal and external ear normal.     Left Ear: Tympanic membrane, ear canal and external ear normal.     Nose: Nose normal.     Mouth/Throat:     Mouth: Mucous membranes are moist.     Pharynx: Oropharynx is clear. No oropharyngeal exudate.  Eyes:     General: No scleral icterus.    Conjunctiva/sclera: Conjunctivae normal.  Neck:     Thyroid : No thyromegaly.  Cardiovascular:     Rate and Rhythm: Normal rate and regular rhythm.     Heart sounds: Normal heart sounds. No murmur heard. Pulmonary:     Effort: Pulmonary effort is normal. No respiratory distress.     Breath sounds: Normal breath sounds. No wheezing, rhonchi or rales.  Musculoskeletal:     Cervical back: Neck supple.     Right lower leg: No edema.     Left lower leg: No edema.  Lymphadenopathy:     Cervical: No cervical adenopathy.  Skin:    General: Skin is warm and dry.     Findings: No rash.  Neurological:     Mental Status: She is alert. Mental status is at baseline.     Cranial Nerves: No cranial nerve deficit.     Sensory: No sensory deficit.     Motor: No weakness.     Gait: Gait normal.  Psychiatric:        Mood and Affect: Mood normal.        Behavior: Behavior normal.       No results found for any visits on 02/24/24.  Assessment & Plan     Problem List Items Addressed This Visit   None Visit Diagnoses       Temporal pain    -  Primary   Relevant Orders   Sed Rate (ESR)   CRP High sensitivity     Gingival bleeding              Suspected temporal arteritis Tenderness in the right temple area with  associated right facial and scalp pain. Differential diagnosis includes temporal arteritis, characterized by inflammation of the temporal artery, which can lead to serious complications such as vision loss if untreated. - Order sed rate and CRP to assess for inflammation - Consider high-dose prednisone  if inflammatory markers are elevated - Refer for temporal artery biopsy if necessary - Advise against extensive online research to avoid anxiety  Right facial and scalp pain Severe right-sided facial and scalp pain described as an 'ice pick' sensation, with tenderness and sensitivity. Episodic pain may be related to sinus inflammation or cluster headaches. No neurological deficits present. - Monitor symptoms - Use over-the-counter pain relief as needed  Sinus inflammation Residual sinus inflammation following a viral course, with pressure behind the eye and clear nasal discharge, indicating a lack of bacterial infection. Likely contributing to facial pain. - Recommend over-the-counter medications such as Tylenol  or ibuprofen  for pain management - Suggest using Flonase or Nasonex to alleviate congestion  Gingival pain after dental procedure Gingival pain and soreness following dental work, possibly due to irritation from dental instruments or underlying gingivitis. Pain has persisted longer than usual post-procedure. - Advise discussing symptoms with a dentist to rule out gingivitis or other dental issues         No orders of the defined types were placed in this encounter.    Return if symptoms worsen or fail to improve.      Jon Eva, MD  [facility] [phone] (phone) [phone] (fax)  [facility]

## 2024-02-24 NOTE — Telephone Encounter (Signed)
 FYI Only or Action Required?: FYI only for provider.  Patient was last seen in primary care on 12/04/2023 by Donzella Lauraine SAILOR, DO.  Called Nurse Triage reporting Facial Pain.  Symptoms began several days ago.  Interventions attempted: Nothing.  Symptoms are: unchanged.  Triage Disposition: See Physician Within 24 Hours  Patient/caregiver understands and will follow disposition?: Yes   Reason for Disposition  Earache  Answer Assessment - Initial Assessment Questions 1. LOCATION: Where does it hurt?      Pressure behind eyes, face and head / head tender to touch  2. ONSET: When did the sinus pain start?  (e.g., hours, days)      Sinus drainage last couple weeks, got a dental cleaning last Wednesday,got ice pick pain on Friday  3. SEVERITY: How bad is the pain?   (Scale 0-10; or none, mild, moderate or severe)     Moderate  4. NASAL CONGESTION: Is the nose blocked? If Yes, ask: Can you open it or must you breathe through your mouth?     Yes, but able to breath through both nostrils   5. NASAL DISCHARGE: Do you have discharge from your nose? If so ask, What color?     Yes, clear  6. FEVER: Do you have a fever? If Yes, ask: What is it, how was it measured, and when did it start?      No  7. OTHER SYMPTOMS: Do you have any other symptoms? (e.g., sore throat, cough, earache, difficulty breathing)     Right ear hurts  Protocols used: Sinus Pain or Congestion-A-AH

## 2024-02-25 ENCOUNTER — Ambulatory Visit: Payer: Self-pay | Admitting: Family Medicine

## 2024-02-25 LAB — HIGH SENSITIVITY CRP: CRP, High Sensitivity: 1.01 mg/L (ref 0.00–3.00)

## 2024-02-25 LAB — SEDIMENTATION RATE: Sed Rate: 7 mm/h (ref 0–40)

## 2024-02-28 ENCOUNTER — Other Ambulatory Visit: Payer: Self-pay | Admitting: Family Medicine

## 2024-02-28 DIAGNOSIS — F3341 Major depressive disorder, recurrent, in partial remission: Secondary | ICD-10-CM

## 2024-02-28 DIAGNOSIS — F411 Generalized anxiety disorder: Secondary | ICD-10-CM

## 2024-04-21 ENCOUNTER — Other Ambulatory Visit: Payer: Self-pay | Admitting: Family Medicine

## 2024-04-21 DIAGNOSIS — R600 Localized edema: Secondary | ICD-10-CM

## 2024-05-07 ENCOUNTER — Other Ambulatory Visit: Payer: Self-pay | Admitting: Family Medicine

## 2024-05-07 DIAGNOSIS — B009 Herpesviral infection, unspecified: Secondary | ICD-10-CM

## 2024-05-07 NOTE — Telephone Encounter (Signed)
 Copied from CRM #[id number]. Topic: Clinical - Medication Refill >> May 07, 2024 11:56 AM Cynthia K wrote: Medication:  valACYclovir  (VALTREX ) 1000 MG tablet  Has the patient contacted their pharmacy? Yes (Agent: If no, request that the patient contact the pharmacy for the refill. If patient does not wish to contact the pharmacy document the reason why and proceed with request.) (Agent: If yes, when and what did the pharmacy advise?) Pharmacy needs order to refill  This is the patient's preferred pharmacy:  [id number] - [place], [place] - [address] [place] [address] Phone: [phone] Fax: [phone]  Is this the correct pharmacy for this prescription? Yes If no, delete pharmacy and type the correct one.   Has the prescription been filled recently? No  Is the patient out of the medication? Yes  Has the patient been seen for an appointment in the last year OR does the patient have an upcoming appointment? Yes  Can we respond through MyChart? Yes  Agent: Please be advised that Rx refills may take up to 3 business days. We ask that you follow-up with your pharmacy.

## 2024-05-08 ENCOUNTER — Ambulatory Visit: Payer: Self-pay | Admitting: Family Medicine

## 2024-05-08 NOTE — Telephone Encounter (Signed)
 FYI Only or Action Required?: Action required by provider: medication refill request.  Patient was last seen in primary care on 02/24/2024 by Myrla Jon HERO, MD.  Called Nurse Triage reporting Rash.  Symptoms began a week ago.  Interventions attempted: Rest, hydration, or home remedies.  Symptoms are: gradually worsening.  Triage Disposition: See HCP Within 4 Hours (Or PCP Triage)  Patient/caregiver understands and will follow disposition?: Yes   Copied from CRM [id number]. Topic: Clinical - Red Word Triage >> May 08, 2024  2:38 PM Donna BRAVO wrote: Red Word that prompted transfer to Nurse Triage:  -started last Friday getting worse -pain left butt cheek  -went from quarter size to 3 inches Reason for Disposition  [1] SEVERE pain AND [2] not improved 2 hours after pain medicine  Answer Assessment - Initial Assessment Questions Pt has hx of herpes. States breakout started last Friday and she has been trying to do at home natural remedies but needs to have this medicine. States a couple days prior to the bumps showing up she had like an itchy pain in that area. States last break out was 2-3 months ago, last one this bad was a couple of years ago. States it is getting worse went from about a quarter in size to about 3 inches. Rn did advise if this can't be filled, she should go to UC to be evaluated. Pt called yesterday for refill request.     1. SYMPTOM: What's the main symptom you're concerned about? (e.g., rash, itching, swelling, dryness)     rash 2. LOCATION: Where is the  rash located? (e.g., inside/outside, left/right)     Left buttocks 3. ONSET: When did the  rash  start?     Last friday 4. PAIN: Is there any pain? If Yes, ask: How bad is it? (Scale: 0-10; none, mild, moderate, severe)     10 5. CAUSE: What do you think is causing the symptoms?     Hx of herpes 6. OTHER SYMPTOMS: Do you have any other symptoms? (e.g., fever, vaginal bleeding, pain with  urination)     denies  Protocols used: Vulvar Symptoms-A-AH

## 2024-05-11 DIAGNOSIS — D2272 Melanocytic nevi of left lower limb, including hip: Secondary | ICD-10-CM | POA: Diagnosis not present

## 2024-05-11 DIAGNOSIS — D2261 Melanocytic nevi of right upper limb, including shoulder: Secondary | ICD-10-CM | POA: Diagnosis not present

## 2024-05-11 DIAGNOSIS — D2262 Melanocytic nevi of left upper limb, including shoulder: Secondary | ICD-10-CM | POA: Diagnosis not present

## 2024-05-11 DIAGNOSIS — D225 Melanocytic nevi of trunk: Secondary | ICD-10-CM | POA: Diagnosis not present

## 2024-05-11 DIAGNOSIS — D485 Neoplasm of uncertain behavior of skin: Secondary | ICD-10-CM | POA: Diagnosis not present

## 2024-05-11 MED ORDER — VALACYCLOVIR HCL 1 G PO TABS: 1000.0000 mg | ORAL_TABLET | Freq: Two times a day (BID) | ORAL | 0 refills | Status: AC

## 2024-05-11 NOTE — Telephone Encounter (Signed)
 Requested medication (s) are due for refill today: routing for review  Requested medication (s) are on the active medication list: yes  Last refill:  04/15/24  Future visit scheduled: no  Notes to clinic:  routing for review, last refill for short supply.     Requested Prescriptions  Pending Prescriptions Disp Refills   valACYclovir  (VALTREX ) 1000 MG tablet 20 tablet 0    Sig: Take 1 tablet (1,000 mg total) by mouth 2 (two) times daily.     Antimicrobials:  Antiviral Agents - Anti-Herpetic Passed - 05/11/2024  8:58 AM      Passed - Valid encounter within last 12 months    Recent Outpatient Visits           2 months ago Temporal pain   [place] [facility] [place], Jon HERO, MD   5 months ago Benign paroxysmal positional vertigo, unspecified laterality   [place] [facility] [place], Lauraine SAILOR, DO   8 months ago Dizziness   [facility] [place], Lauraine SAILOR, DO   9 months ago Recurrent major depressive disorder, in partial remission   [facility] [place], Lauraine SAILOR, DO

## 2024-05-13 ENCOUNTER — Other Ambulatory Visit: Payer: Self-pay | Admitting: Family Medicine

## 2024-05-13 DIAGNOSIS — R0602 Shortness of breath: Secondary | ICD-10-CM
